# Patient Record
Sex: Female | Born: 1965 | Hispanic: Yes | Marital: Married | Smoking: Never smoker
Health system: Southern US, Community
[De-identification: ages and names within clinical notes are randomized; demographics above are authoritative.]

## PROBLEM LIST (undated history)

## (undated) DIAGNOSIS — E119 Type 2 diabetes mellitus without complications: Secondary | ICD-10-CM

## (undated) DIAGNOSIS — E785 Hyperlipidemia, unspecified: Secondary | ICD-10-CM

## (undated) DIAGNOSIS — I10 Essential (primary) hypertension: Secondary | ICD-10-CM

## (undated) HISTORY — PX: APPENDECTOMY: SHX54

---

## 2006-06-04 ENCOUNTER — Emergency Department: Payer: Self-pay | Admitting: Emergency Medicine

## 2010-02-23 ENCOUNTER — Ambulatory Visit: Payer: Self-pay | Admitting: Family Medicine

## 2010-04-02 ENCOUNTER — Ambulatory Visit: Payer: Self-pay | Admitting: Family Medicine

## 2010-05-18 ENCOUNTER — Ambulatory Visit: Payer: Self-pay | Admitting: Family Medicine

## 2010-10-06 ENCOUNTER — Ambulatory Visit: Payer: Self-pay | Admitting: Surgery

## 2011-01-27 ENCOUNTER — Ambulatory Visit: Payer: Self-pay | Admitting: Family Medicine

## 2011-04-14 ENCOUNTER — Ambulatory Visit: Payer: Self-pay | Admitting: Physician Assistant

## 2011-05-08 ENCOUNTER — Ambulatory Visit: Payer: Self-pay | Admitting: Physician Assistant

## 2011-05-10 ENCOUNTER — Ambulatory Visit: Payer: Self-pay | Admitting: Surgery

## 2015-09-16 ENCOUNTER — Other Ambulatory Visit: Payer: Self-pay | Admitting: Physician Assistant

## 2015-09-16 DIAGNOSIS — R1011 Right upper quadrant pain: Secondary | ICD-10-CM

## 2015-09-22 ENCOUNTER — Ambulatory Visit
Admission: RE | Admit: 2015-09-22 | Discharge: 2015-09-22 | Disposition: A | Discharge status: Home / Self Care | Payer: BLUE CROSS/BLUE SHIELD | Source: Ambulatory Visit | Attending: Physician Assistant | Admitting: Physician Assistant

## 2015-09-22 DIAGNOSIS — R1011 Right upper quadrant pain: Secondary | ICD-10-CM

## 2015-09-22 DIAGNOSIS — R109 Unspecified abdominal pain: Secondary | ICD-10-CM | POA: Diagnosis present

## 2015-10-15 ENCOUNTER — Ambulatory Visit
Admission: RE | Admit: 2015-10-15 | Discharge: 2015-10-15 | Disposition: A | Discharge status: Home / Self Care | Payer: BLUE CROSS/BLUE SHIELD | Source: Ambulatory Visit | Attending: Physician Assistant | Admitting: Physician Assistant

## 2015-10-15 ENCOUNTER — Other Ambulatory Visit: Payer: Self-pay | Admitting: Physician Assistant

## 2015-10-15 DIAGNOSIS — R52 Pain, unspecified: Secondary | ICD-10-CM

## 2015-10-15 DIAGNOSIS — M79604 Pain in right leg: Secondary | ICD-10-CM | POA: Diagnosis present

## 2015-10-15 DIAGNOSIS — M25551 Pain in right hip: Secondary | ICD-10-CM | POA: Diagnosis not present

## 2018-04-20 ENCOUNTER — Encounter: Payer: Self-pay | Admitting: Emergency Medicine

## 2018-04-20 ENCOUNTER — Emergency Department: Payer: 59

## 2018-04-20 ENCOUNTER — Inpatient Hospital Stay: Payer: 59

## 2018-04-20 ENCOUNTER — Inpatient Hospital Stay
Admission: EM | Admit: 2018-04-20 | Discharge: 2018-04-23 | DRG: 871 | Disposition: A | Discharge status: Home / Self Care | Payer: 59 | Attending: Specialist | Admitting: Specialist

## 2018-04-20 ENCOUNTER — Other Ambulatory Visit: Payer: Self-pay

## 2018-04-20 DIAGNOSIS — R6521 Severe sepsis with septic shock: Secondary | ICD-10-CM | POA: Diagnosis present

## 2018-04-20 DIAGNOSIS — N390 Urinary tract infection, site not specified: Secondary | ICD-10-CM | POA: Diagnosis present

## 2018-04-20 DIAGNOSIS — R509 Fever, unspecified: Secondary | ICD-10-CM

## 2018-04-20 DIAGNOSIS — E876 Hypokalemia: Secondary | ICD-10-CM | POA: Diagnosis present

## 2018-04-20 DIAGNOSIS — Z791 Long term (current) use of non-steroidal anti-inflammatories (NSAID): Secondary | ICD-10-CM | POA: Diagnosis not present

## 2018-04-20 DIAGNOSIS — N281 Cyst of kidney, acquired: Secondary | ICD-10-CM | POA: Diagnosis present

## 2018-04-20 DIAGNOSIS — E785 Hyperlipidemia, unspecified: Secondary | ICD-10-CM | POA: Diagnosis present

## 2018-04-20 DIAGNOSIS — I10 Essential (primary) hypertension: Secondary | ICD-10-CM | POA: Diagnosis present

## 2018-04-20 DIAGNOSIS — A4151 Sepsis due to Escherichia coli [E. coli]: Principal | ICD-10-CM | POA: Diagnosis present

## 2018-04-20 DIAGNOSIS — Z8249 Family history of ischemic heart disease and other diseases of the circulatory system: Secondary | ICD-10-CM | POA: Diagnosis not present

## 2018-04-20 DIAGNOSIS — Z87440 Personal history of urinary (tract) infections: Secondary | ICD-10-CM | POA: Diagnosis not present

## 2018-04-20 DIAGNOSIS — R1011 Right upper quadrant pain: Secondary | ICD-10-CM

## 2018-04-20 DIAGNOSIS — E1140 Type 2 diabetes mellitus with diabetic neuropathy, unspecified: Secondary | ICD-10-CM | POA: Diagnosis present

## 2018-04-20 DIAGNOSIS — N10 Acute tubulo-interstitial nephritis: Secondary | ICD-10-CM | POA: Diagnosis not present

## 2018-04-20 DIAGNOSIS — N12 Tubulo-interstitial nephritis, not specified as acute or chronic: Secondary | ICD-10-CM

## 2018-04-20 DIAGNOSIS — Z23 Encounter for immunization: Secondary | ICD-10-CM

## 2018-04-20 DIAGNOSIS — Q6102 Congenital multiple renal cysts: Secondary | ICD-10-CM

## 2018-04-20 DIAGNOSIS — Z794 Long term (current) use of insulin: Secondary | ICD-10-CM

## 2018-04-20 DIAGNOSIS — E1169 Type 2 diabetes mellitus with other specified complication: Secondary | ICD-10-CM

## 2018-04-20 HISTORY — DX: Type 2 diabetes mellitus without complications: E11.9

## 2018-04-20 HISTORY — DX: Hyperlipidemia, unspecified: E78.5

## 2018-04-20 HISTORY — DX: Essential (primary) hypertension: I10

## 2018-04-20 LAB — BLOOD CULTURE ID PANEL (REFLEXED)
ACINETOBACTER BAUMANNII: NOT DETECTED
CANDIDA ALBICANS: NOT DETECTED
CANDIDA GLABRATA: NOT DETECTED
CANDIDA KRUSEI: NOT DETECTED
CANDIDA PARAPSILOSIS: NOT DETECTED
CANDIDA TROPICALIS: NOT DETECTED
Carbapenem resistance: NOT DETECTED
ENTEROBACTER CLOACAE COMPLEX: NOT DETECTED
ESCHERICHIA COLI: DETECTED — AB
Enterobacteriaceae species: DETECTED — AB
Enterococcus species: NOT DETECTED
Haemophilus influenzae: NOT DETECTED
KLEBSIELLA PNEUMONIAE: NOT DETECTED
Klebsiella oxytoca: NOT DETECTED
Listeria monocytogenes: NOT DETECTED
Neisseria meningitidis: NOT DETECTED
PROTEUS SPECIES: NOT DETECTED
Pseudomonas aeruginosa: NOT DETECTED
Serratia marcescens: NOT DETECTED
Staphylococcus aureus (BCID): NOT DETECTED
Staphylococcus species: NOT DETECTED
Streptococcus agalactiae: NOT DETECTED
Streptococcus pneumoniae: NOT DETECTED
Streptococcus pyogenes: NOT DETECTED
Streptococcus species: NOT DETECTED

## 2018-04-20 LAB — CBC
HCT: 38.4 % (ref 35.0–47.0)
Hemoglobin: 13.6 g/dL (ref 12.0–16.0)
MCH: 29.9 pg (ref 26.0–34.0)
MCHC: 35.5 g/dL (ref 32.0–36.0)
MCV: 84.2 fL (ref 80.0–100.0)
PLATELETS: 183 10*3/uL (ref 150–440)
RBC: 4.56 MIL/uL (ref 3.80–5.20)
RDW: 13.8 % (ref 11.5–14.5)
WBC: 7.4 10*3/uL (ref 3.6–11.0)

## 2018-04-20 LAB — COMPREHENSIVE METABOLIC PANEL
ALBUMIN: 3.6 g/dL (ref 3.5–5.0)
ALK PHOS: 83 U/L (ref 38–126)
ALT: 17 U/L (ref 0–44)
AST: 22 U/L (ref 15–41)
Anion gap: 14 (ref 5–15)
BUN: 21 mg/dL — ABNORMAL HIGH (ref 6–20)
CALCIUM: 8.8 mg/dL — AB (ref 8.9–10.3)
CO2: 23 mmol/L (ref 22–32)
CREATININE: 0.99 mg/dL (ref 0.44–1.00)
Chloride: 92 mmol/L — ABNORMAL LOW (ref 98–111)
GFR calc non Af Amer: >60 mL/min (ref >60)
GLUCOSE: 533 mg/dL — AB (ref 70–99)
Potassium: 3.7 mmol/L (ref 3.5–5.1)
SODIUM: 129 mmol/L — AB (ref 135–145)
Total Bilirubin: 1.8 mg/dL — ABNORMAL HIGH (ref 0.3–1.2)
Total Protein: 7.8 g/dL (ref 6.5–8.1)

## 2018-04-20 LAB — URINALYSIS, COMPLETE (UACMP) WITH MICROSCOPIC
Bilirubin Urine: NEGATIVE
Glucose, UA: >=500 mg/dL — AB
Ketones, ur: 5 mg/dL — AB
Leukocytes, UA: NEGATIVE
Nitrite: NEGATIVE
Protein, ur: 30 mg/dL — AB
SPECIFIC GRAVITY, URINE: 1.02 (ref 1.005–1.030)
pH: 6 (ref 5.0–8.0)

## 2018-04-20 LAB — TROPONIN I

## 2018-04-20 LAB — LACTIC ACID, PLASMA: Lactic Acid, Venous: 1.7 mmol/L (ref 0.5–1.9)

## 2018-04-20 LAB — GLUCOSE, CAPILLARY
GLUCOSE-CAPILLARY: 434 mg/dL — AB (ref 70–99)
GLUCOSE-CAPILLARY: 354 mg/dL — AB (ref 70–99)
Glucose-Capillary: 429 mg/dL — ABNORMAL HIGH (ref 70–99)
Glucose-Capillary: 320 mg/dL — ABNORMAL HIGH (ref 70–99)
Glucose-Capillary: 396 mg/dL — ABNORMAL HIGH (ref 70–99)

## 2018-04-20 LAB — LIPASE, BLOOD: Lipase: 50 U/L (ref 11–51)

## 2018-04-20 MED ORDER — SODIUM CHLORIDE 0.9 % IV SOLN
2.0000 g | INTRAVENOUS | Status: DC
  Administered 2018-04-21 – 2018-04-22 (×2): 2 g via INTRAVENOUS
  Filled 2018-04-20: qty 2
  Filled 2018-04-20: qty 20
  Filled 2018-04-20: qty 2

## 2018-04-20 MED ORDER — ONDANSETRON HCL 4 MG/2ML IJ SOLN: 4.0000 mg | Freq: Four times a day (QID) | INTRAMUSCULAR | Status: DC | PRN

## 2018-04-20 MED ORDER — SODIUM CHLORIDE 0.9 % IV SOLN
INTRAVENOUS | Status: DC
  Administered 2018-04-20 – 2018-04-22 (×6): via INTRAVENOUS

## 2018-04-20 MED ORDER — SODIUM CHLORIDE 0.9 % IV BOLUS
1000.0000 mL | Freq: Once | INTRAVENOUS | Status: AC
  Administered 2018-04-20: 1000 mL via INTRAVENOUS

## 2018-04-20 MED ORDER — PIPERACILLIN-TAZOBACTAM 3.375 G IVPB 30 MIN
3.3750 g | Freq: Once | INTRAVENOUS | Status: AC
  Administered 2018-04-20: 3.375 g via INTRAVENOUS
  Filled 2018-04-20: qty 50

## 2018-04-20 MED ORDER — GADOBUTROL 1 MMOL/ML IV SOLN
8.3000 mL | Freq: Once | INTRAVENOUS | Status: AC | PRN
  Administered 2018-04-20: 23:00:00 8.3 mL via INTRAVENOUS

## 2018-04-20 MED ORDER — FENTANYL CITRATE (PF) 100 MCG/2ML IJ SOLN
50.0000 ug | Freq: Once | INTRAMUSCULAR | Status: AC
  Administered 2018-04-20: 50 ug via INTRAVENOUS
  Filled 2018-04-20: qty 2

## 2018-04-20 MED ORDER — ACETAMINOPHEN 650 MG RE SUPP: 650.0000 mg | Freq: Four times a day (QID) | RECTAL | Status: DC | PRN

## 2018-04-20 MED ORDER — PNEUMOCOCCAL VAC POLYVALENT 25 MCG/0.5ML IJ INJ
0.5000 mL | INJECTION | INTRAMUSCULAR | Status: AC
  Administered 2018-04-21: 0.5 mL via INTRAMUSCULAR
  Filled 2018-04-20: qty 0.5

## 2018-04-20 MED ORDER — IOPAMIDOL (ISOVUE-300) INJECTION 61%
100.0000 mL | Freq: Once | INTRAVENOUS | Status: AC | PRN
  Administered 2018-04-20: 100 mL via INTRAVENOUS

## 2018-04-20 MED ORDER — ENOXAPARIN SODIUM 40 MG/0.4ML ~~LOC~~ SOLN
40.0000 mg | SUBCUTANEOUS | Status: DC
  Administered 2018-04-20 – 2018-04-22 (×3): 40 mg via SUBCUTANEOUS
  Filled 2018-04-20 (×3): qty 0.4

## 2018-04-20 MED ORDER — IOPAMIDOL (ISOVUE-300) INJECTION 61%
30.0000 mL | Freq: Once | INTRAVENOUS | Status: AC | PRN
  Administered 2018-04-20: 30 mL via ORAL

## 2018-04-20 MED ORDER — SIMVASTATIN 20 MG PO TABS
40.0000 mg | ORAL_TABLET | Freq: Every day | ORAL | Status: DC
  Administered 2018-04-20 – 2018-04-22 (×3): 40 mg via ORAL
  Filled 2018-04-20 (×3): qty 2

## 2018-04-20 MED ORDER — INFLUENZA VAC SPLIT QUAD 0.5 ML IM SUSY
0.5000 mL | PREFILLED_SYRINGE | INTRAMUSCULAR | Status: AC
  Administered 2018-04-21: 08:00:00 0.5 mL via INTRAMUSCULAR
  Filled 2018-04-20: qty 0.5

## 2018-04-20 MED ORDER — POLYETHYLENE GLYCOL 3350 17 G PO PACK: 17.0000 g | PACK | Freq: Every day | ORAL | Status: DC | PRN

## 2018-04-20 MED ORDER — SODIUM CHLORIDE 0.9 % IV SOLN
1.0000 g | INTRAVENOUS | Status: DC
  Administered 2018-04-20: 1 g via INTRAVENOUS
  Filled 2018-04-20 (×2): qty 10

## 2018-04-20 MED ORDER — VANCOMYCIN HCL IN DEXTROSE 1-5 GM/200ML-% IV SOLN
1000.0000 mg | Freq: Once | INTRAVENOUS | Status: AC
  Administered 2018-04-20: 1000 mg via INTRAVENOUS
  Filled 2018-04-20: qty 200

## 2018-04-20 MED ORDER — ALBUTEROL SULFATE (2.5 MG/3ML) 0.083% IN NEBU: 2.5000 mg | INHALATION_SOLUTION | RESPIRATORY_TRACT | Status: DC | PRN

## 2018-04-20 MED ORDER — INSULIN ASPART 100 UNIT/ML ~~LOC~~ SOLN
0.0000 [IU] | Freq: Every day | SUBCUTANEOUS | Status: DC
  Administered 2018-04-20: 5 [IU] via SUBCUTANEOUS
  Administered 2018-04-21: 0.3 [IU] via SUBCUTANEOUS
  Administered 2018-04-22: 3 [IU] via SUBCUTANEOUS
  Filled 2018-04-20 (×3): qty 1

## 2018-04-20 MED ORDER — HYDROCODONE-ACETAMINOPHEN 5-325 MG PO TABS
1.0000 | ORAL_TABLET | ORAL | Status: DC | PRN
  Administered 2018-04-20: 19:00:00 1 via ORAL
  Filled 2018-04-20: qty 1

## 2018-04-20 MED ORDER — SODIUM CHLORIDE 0.9 % IV SOLN: 1.0000 g | INTRAVENOUS | Status: DC

## 2018-04-20 MED ORDER — ACETAMINOPHEN 500 MG PO TABS
1000.0000 mg | ORAL_TABLET | Freq: Once | ORAL | Status: AC
  Administered 2018-04-20: 1000 mg via ORAL
  Filled 2018-04-20: qty 2

## 2018-04-20 MED ORDER — PANTOPRAZOLE SODIUM 40 MG PO TBEC
40.0000 mg | DELAYED_RELEASE_TABLET | Freq: Every day | ORAL | Status: DC
  Administered 2018-04-21 – 2018-04-23 (×3): 40 mg via ORAL
  Filled 2018-04-20 (×3): qty 1

## 2018-04-20 MED ORDER — GABAPENTIN 300 MG PO CAPS
300.0000 mg | ORAL_CAPSULE | Freq: Two times a day (BID) | ORAL | Status: DC
  Administered 2018-04-20 – 2018-04-23 (×6): 300 mg via ORAL
  Filled 2018-04-20 (×6): qty 1

## 2018-04-20 MED ORDER — KETOROLAC TROMETHAMINE 30 MG/ML IJ SOLN: 30.0000 mg | Freq: Four times a day (QID) | INTRAMUSCULAR | Status: DC | PRN

## 2018-04-20 MED ORDER — INSULIN DETEMIR 100 UNIT/ML ~~LOC~~ SOLN
45.0000 [IU] | Freq: Every day | SUBCUTANEOUS | Status: DC
  Administered 2018-04-20: 45 [IU] via SUBCUTANEOUS
  Filled 2018-04-20 (×2): qty 0.45

## 2018-04-20 MED ORDER — ACETAMINOPHEN 325 MG PO TABS
650.0000 mg | ORAL_TABLET | Freq: Four times a day (QID) | ORAL | Status: DC | PRN
  Administered 2018-04-20 – 2018-04-23 (×6): 650 mg via ORAL
  Filled 2018-04-20 (×6): qty 2

## 2018-04-20 MED ORDER — INSULIN ASPART 100 UNIT/ML ~~LOC~~ SOLN
20.0000 [IU] | Freq: Three times a day (TID) | SUBCUTANEOUS | Status: DC
  Administered 2018-04-21 – 2018-04-23 (×6): 20 [IU] via SUBCUTANEOUS
  Filled 2018-04-20 (×5): qty 1

## 2018-04-20 MED ORDER — INSULIN ASPART 100 UNIT/ML ~~LOC~~ SOLN
10.0000 [IU] | Freq: Once | SUBCUTANEOUS | Status: AC
  Administered 2018-04-20: 10 [IU] via SUBCUTANEOUS
  Filled 2018-04-20: qty 1

## 2018-04-20 MED ORDER — ONDANSETRON HCL 4 MG PO TABS: 4.0000 mg | ORAL_TABLET | Freq: Four times a day (QID) | ORAL | Status: DC | PRN

## 2018-04-20 MED ORDER — ONDANSETRON HCL 4 MG/2ML IJ SOLN
4.0000 mg | Freq: Once | INTRAMUSCULAR | Status: AC
  Administered 2018-04-20: 4 mg via INTRAVENOUS
  Filled 2018-04-20: qty 2

## 2018-04-20 MED ORDER — SODIUM CHLORIDE 0.9 % IV SOLN
1.0000 g | Freq: Once | INTRAVENOUS | Status: AC
  Administered 2018-04-20: 1 g via INTRAVENOUS
  Filled 2018-04-20: qty 1

## 2018-04-20 MED ORDER — INSULIN ASPART 100 UNIT/ML ~~LOC~~ SOLN
0.0000 [IU] | Freq: Three times a day (TID) | SUBCUTANEOUS | Status: DC
  Administered 2018-04-20: 17:00:00 15 [IU] via SUBCUTANEOUS
  Administered 2018-04-20 – 2018-04-21 (×2): 11 [IU] via SUBCUTANEOUS
  Administered 2018-04-21: 08:00:00 8 [IU] via SUBCUTANEOUS
  Administered 2018-04-21 – 2018-04-22 (×3): 5 [IU] via SUBCUTANEOUS
  Administered 2018-04-22: 17:00:00 8 [IU] via SUBCUTANEOUS
  Filled 2018-04-20 (×9): qty 1

## 2018-04-20 MED ORDER — ATENOLOL 50 MG PO TABS
50.0000 mg | ORAL_TABLET | Freq: Every day | ORAL | Status: DC
  Administered 2018-04-20 – 2018-04-23 (×4): 50 mg via ORAL
  Filled 2018-04-20 (×5): qty 1

## 2018-04-20 MED ORDER — POLYETHYLENE GLYCOL 3350 17 G PO PACK
17.0000 g | PACK | Freq: Two times a day (BID) | ORAL | Status: DC
  Administered 2018-04-21: 17 g via ORAL
  Filled 2018-04-20: qty 1

## 2018-04-20 NOTE — ED Notes (Signed)
Pt went to ultrasound.

## 2018-04-20 NOTE — ED Triage Notes (Signed)
Patient coming in tonight for abdominal pain and nausea. Saw pcp on Tuesday and told her she should start feeling better in a couple of days. Patient states pain is 10/10

## 2018-04-20 NOTE — Progress Notes (Signed)
CODE SEPSIS - PHARMACY COMMUNICATION  **Broad Spectrum Antibiotics should be administered within 1 hour of Sepsis diagnosis**  Time Code Sepsis Called/Page Received: 1003 0542  Antibiotics Ordered: 1003 0500  Time of 1st antibiotic administration: 1003 0610  Additional action taken by pharmacy:   If necessary, Name of Provider/Nurse Contacted:     Erich Montane ,PharmD Clinical Pharmacist  04/20/2018  6:13 AM

## 2018-04-20 NOTE — Progress Notes (Signed)
PHARMACY - PHYSICIAN COMMUNICATION CRITICAL VALUE ALERT - BLOOD CULTURE IDENTIFICATION (BCID)  Results for orders placed or performed during the hospital encounter of 04/20/18  Blood Culture ID Panel (Reflexed) (Collected: 04/20/2018  5:14 AM)  Result Value Ref Range   Enterococcus species NOT DETECTED NOT DETECTED   Listeria monocytogenes NOT DETECTED NOT DETECTED   Staphylococcus species NOT DETECTED NOT DETECTED   Staphylococcus aureus (BCID) NOT DETECTED NOT DETECTED   Streptococcus species NOT DETECTED NOT DETECTED   Streptococcus agalactiae NOT DETECTED NOT DETECTED   Streptococcus pneumoniae NOT DETECTED NOT DETECTED   Streptococcus pyogenes NOT DETECTED NOT DETECTED   Acinetobacter baumannii NOT DETECTED NOT DETECTED   Enterobacteriaceae species DETECTED (A) NOT DETECTED   Enterobacter cloacae complex NOT DETECTED NOT DETECTED   Escherichia coli DETECTED (A) NOT DETECTED   Klebsiella oxytoca NOT DETECTED NOT DETECTED   Klebsiella pneumoniae NOT DETECTED NOT DETECTED   Proteus species NOT DETECTED NOT DETECTED   Serratia marcescens NOT DETECTED NOT DETECTED   Carbapenem resistance NOT DETECTED NOT DETECTED   Haemophilus influenzae NOT DETECTED NOT DETECTED   Neisseria meningitidis NOT DETECTED NOT DETECTED   Pseudomonas aeruginosa NOT DETECTED NOT DETECTED   Candida albicans NOT DETECTED NOT DETECTED   Candida glabrata NOT DETECTED NOT DETECTED   Candida krusei NOT DETECTED NOT DETECTED   Candida parapsilosis NOT DETECTED NOT DETECTED   Candida tropicalis NOT DETECTED NOT DETECTED    Name of physician (or Provider) Contacted: Sudini   Changes to prescribed antibiotics required:  Yes, will increase dose to Ceftriaxone 2 gm IV Q24H.   Alisandra Son D 04/20/2018  5:00 PM

## 2018-04-20 NOTE — ED Notes (Signed)
Pt assisted to the bathroom at this time. Pt back to bed without incident. Will continue to monitor.

## 2018-04-20 NOTE — ED Notes (Signed)
CT notified patient done with contrast 

## 2018-04-20 NOTE — ED Provider Notes (Signed)
[facility] Emergency Department Provider Note   ____________________________________________   First MD Initiated Contact with Patient 04/20/18 [phone]     (approximate)  I have reviewed the triage vital signs and the nursing notes.   HISTORY  Chief Complaint Abdominal Pain  History obtained using [employer] Spanish interpreter  HPI Nicole Villegas is a 52 y.o. female who presents to the ED from home with a chief complaint of abdominal pain and nausea.  Symptoms started 5 days ago.  Saw her PCP 2 days ago who told her she should be feeling better in a couple of days.  Complains of nausea without vomiting.  Felt feverish at home and took Tylenol prior to arrival.  Denies chest pain, cough, shortness of breath, vomiting, dysuria, diarrhea.  Denies recent travel or trauma.   Past Medical History:  Diagnosis Date  . Diabetes mellitus without complication (HCC)   . Hyperlipidemia   . Hypertension     There are no active problems to display for this patient.    Prior to Admission medications   Not on File    Allergies Patient has no known allergies.  No family history on file.  Social History Social History   Tobacco Use  . Smoking status: Not on file  Substance Use Topics  . Alcohol use: Not on file  . Drug use: Not on file  No recent EtOH  Review of Systems  Constitutional: Positive for fever/chills Eyes: No visual changes. ENT: No sore throat. Cardiovascular: Denies chest pain. Respiratory: Denies shortness of breath. Gastrointestinal: Positive for abdominal pain.  Positive for nausea, no vomiting.  No diarrhea.  No constipation. Genitourinary: Negative for dysuria. Musculoskeletal: Negative for back pain. Skin: Negative for rash. Neurological: Negative for headaches, focal weakness or numbness.   ____________________________________________   PHYSICAL EXAM:  VITAL SIGNS: ED Triage Vitals [04/20/18 0306]  Enc Vitals Group     BP  (!) 189/87     Pulse Rate (!) 122     Resp 17     Temp 100.3 F (37.9 C)     Temp Source Oral     SpO2 99 %     Weight      Height      Head Circumference      Peak Flow      Pain Score 10     Pain Loc      Pain Edu?      Excl. in GC?     Constitutional: Alert and oriented.  Uncomfortable appearing and in mild acute distress. Eyes: Conjunctivae are normal. PERRL. EOMI. Head: Atraumatic. Nose: No congestion/rhinnorhea. Mouth/Throat: Mucous membranes are moist.  Oropharynx non-erythematous. Neck: No stridor.   Cardiovascular: Tachycardic rate, regular rhythm. Grossly normal heart sounds.  Good peripheral circulation. Respiratory: Normal respiratory effort.  No retractions. Lungs CTAB. Gastrointestinal: Soft and mildly tender to palpation right upper quadrant without rebound or guarding. No distention. No abdominal bruits. No CVA tenderness. Musculoskeletal: No lower extremity tenderness nor edema.  No joint effusions. Neurologic:  Normal speech and language. No gross focal neurologic deficits are appreciated. No gait instability. Skin:  Skin is warm, dry and intact. No rash noted.  No petechiae. Psychiatric: Mood and affect are normal. Speech and behavior are normal.  ____________________________________________   LABS (all labs ordered are listed, but only abnormal results are displayed)  Labs Reviewed  COMPREHENSIVE METABOLIC PANEL - Abnormal; Notable for the following components:      Result Value   Sodium 129 (*)  Chloride 92 (*)    Glucose, Bld 533 (*)    BUN 21 (*)    Calcium 8.8 (*)    Total Bilirubin 1.8 (*)    All other components within normal limits  URINALYSIS, COMPLETE (UACMP) WITH MICROSCOPIC - Abnormal; Notable for the following components:   Color, Urine YELLOW (*)    APPearance HAZY (*)    Glucose, UA >=500 (*)    Hgb urine dipstick MODERATE (*)    Ketones, ur 5 (*)    Protein, ur 30 (*)    Bacteria, UA FEW (*)    All other components within  normal limits  CULTURE, BLOOD (ROUTINE X 2)  CULTURE, BLOOD (ROUTINE X 2)  URINE CULTURE  LIPASE, BLOOD  CBC  LACTIC ACID, PLASMA  TROPONIN I  POC URINE PREG, ED   ____________________________________________  EKG  ED ECG REPORT I, SUNG,JADE J, the attending physician, personally viewed and interpreted this ECG.   Date: 04/20/2018  EKG Time: 0720  Rate: 98  Rhythm: normal EKG, normal sinus rhythm  Axis: Normal  Intervals:none  ST&T Change: Nonspecific  ____________________________________________  RADIOLOGY  ED MD interpretation: Unremarkable abdominal ultrasound  Official radiology report(s): US Abdomen Limited Ruq  Result Date: 04/20/2018 CLINICAL DATA:  Right upper quadrant pain. EXAM: ULTRASOUND ABDOMEN LIMITED RIGHT UPPER QUADRANT COMPARISON:  Right upper quadrant ultrasound 09/22/2015 FINDINGS: Gallbladder: Physiologically distended, fold in the fundus. No gallstones or wall thickening visualized. No pericholecystic fluid. No sonographic Murphy sign noted by sonographer. Common bile duct: Diameter: 3 mm, normal. Liver: No focal lesion identified. Diffusely increased in parenchymal echogenicity. Liver parenchyma is difficult to penetrate. Portal vein is patent on color Doppler imaging with normal direction of blood flow towards the liver. IMPRESSION: 1. Normal sonographic appearance of the gallbladder and biliary tree. 2. Hepatic steatosis. Electronically Signed   By: Narda Rutherford M.D.   On: 04/20/2018 06:51    ____________________________________________   PROCEDURES  Procedure(s) performed: None  Procedures  Critical Care performed: Yes, see critical care note(s)   CRITICAL CARE Performed by: Irean Hong   Total critical care time: 30 minutes  Critical care time was exclusive of separately billable procedures and treating other patients.  Critical care was necessary to treat or prevent imminent or life-threatening deterioration.  Critical care was  time spent personally by me on the following activities: development of treatment plan with patient and/or surrogate as well as nursing, discussions with consultants, evaluation of patient's response to treatment, examination of patient, obtaining history from patient or surrogate, ordering and performing treatments and interventions, ordering and review of laboratory studies, ordering and review of radiographic studies, pulse oximetry and re-evaluation of patient's condition.  ____________________________________________   INITIAL IMPRESSION / ASSESSMENT AND PLAN / ED COURSE  As part of my medical decision making, I reviewed the following data within the electronic MEDICAL RECORD NUMBER Nursing notes reviewed and incorporated, Interpreter needed, Labs reviewed, EKG interpreted, Old chart reviewed, Radiograph reviewed  and Notes from prior ED visits   52 year old female who presents with fever, abdominal pain and nausea. Differential diagnosis includes, but is not limited to, biliary disease (biliary colic, acute cholecystitis, cholangitis, choledocholithiasis, etc), intrathoracic causes for epigastric abdominal pain including ACS, gastritis, duodenitis, pancreatitis, small bowel or large bowel obstruction, abdominal aortic aneurysm, hernia, and ulcer(s).  Recheck patient's oral temperature which is now 102.9 F.  ED code sepsis initiated.  Will administer oral Tylenol, initiate IV fluid resuscitation.  Laboratory results remarkable for mild hyponatremia, hyperglycemia without elevation  of anion gap and elevated T bili.  Broad-spectrum IV antibiotics ordered.  Will start with right upper quadrant abdominal ultrasound to evaluate for cholecystitis.   Clinical Course as of Apr 20 716  Thu Apr 20, 2018  1610 US unremarkable. Will proceed with CT abdomen/pelvis. Care transferred to Dr. Cyril Loosen.   [JS]    Clinical Course User Index [JS] Irean Hong, MD      ____________________________________________   FINAL CLINICAL IMPRESSION(S) / ED DIAGNOSES  Final diagnoses:  Fever, unspecified fever cause  Right upper quadrant abdominal pain     ED Discharge Orders    None       Note:  This document was prepared using Dragon voice recognition software and may include unintentional dictation errors.    Irean Hong, MD 04/20/18 [phone]

## 2018-04-20 NOTE — ED Notes (Signed)
Pt returned from CT at this time.  

## 2018-04-20 NOTE — H&P (Signed)
[facility] [place] [facility]   PATIENT NAME: Nicole Villegas    MR#:  [id number]  DATE OF BIRTH:  1966-06-07  DATE OF ADMISSION:  04/20/2018  PRIMARY CARE PHYSICIAN: Verlee Monte, PA-C   REQUESTING/REFERRING PHYSICIAN: Dr. Cyril Loosen  CHIEF COMPLAINT:   Chief Complaint  Patient presents with  . Abdominal Pain    HISTORY OF PRESENT ILLNESS:  Nicole Villegas is a 52 y.o. female with a known history of diabetes, HTN presented to the emergency room with right abdominal pain.  Initial suspicion was for gallbladder abnormality and an ultrasound of right upper quadrant was checked and showed hepatic steatosis but no cholecystitis.  Patient had fever at home and had taken Tylenol prior to presentation.  Here in the emergency room she had fever of 102.9.  Code sepsis was initiated.  Abnormal urinalysis with UTI.  CT scan of the abdomen and pelvis has been checked which showed left and right kidney cysts seem to be solid.  Concern for renal cell carcinoma.  Patient is being admitted with sepsis and possible pyelonephritis and needing further work-up for renal cysts.  PAST MEDICAL HISTORY:   Past Medical History:  Diagnosis Date  . Diabetes mellitus without complication (HCC)   . Hyperlipidemia   . Hypertension     PAST SURGICAL HISTORY:  None  SOCIAL HISTORY:   Social History   Tobacco Use  . Smoking status: Not on file  Substance Use Topics  . Alcohol use: Not on file    FAMILY HISTORY:  No family history on file.  HTN  DRUG ALLERGIES:  No Known Allergies  REVIEW OF SYSTEMS:   Review of Systems  Constitutional: Positive for chills, fever and malaise/fatigue. Negative for weight loss.  HENT: Negative for hearing loss and nosebleeds.   Eyes: Negative for blurred vision, double vision and pain.  Respiratory: Negative for cough, hemoptysis, sputum production, shortness of breath and wheezing.   Cardiovascular: Negative for chest pain, palpitations,  orthopnea and leg swelling.  Gastrointestinal: Positive for abdominal pain. Negative for constipation, diarrhea, nausea and vomiting.  Genitourinary: Negative for dysuria and hematuria.  Musculoskeletal: Negative for back pain, falls and myalgias.  Skin: Negative for rash.  Neurological: Negative for dizziness, tremors, sensory change, speech change, focal weakness, seizures and headaches.  Endo/Heme/Allergies: Does not bruise/bleed easily.  Psychiatric/Behavioral: Negative for depression and memory loss. The patient is not nervous/anxious.     MEDICATIONS AT HOME:   Prior to Admission medications   Medication Sig Start Date End Date Taking? Authorizing Provider  atenolol (TENORMIN) 50 MG tablet Take 50 mg by mouth daily.   Yes [provider]  cyclobenzaprine (FLEXERIL) 5 MG tablet Take 5 mg by mouth daily as needed for muscle spasms.    Yes [provider]  fexofenadine (ALLEGRA) 60 MG tablet Take 60 mg by mouth 2 (two) times daily as needed for allergies.    Yes [provider]  gabapentin (NEURONTIN) 300 MG capsule Take 300 mg by mouth 2 (two) times daily.   Yes [provider]  GLIPIZIDE XL 10 MG 24 hr tablet Take 10 mg by mouth 2 (two) times daily.   Yes [provider]  insulin aspart (NOVOLOG) 100 UNIT/ML injection Inject 30 Units into the skin 3 (three) times daily before meals.   Yes [provider]  insulin detemir (LEVEMIR) 100 UNIT/ML injection Inject 60 Units into the skin at bedtime.   Yes [provider]  lisinopril-hydrochlorothiazide (PRINZIDE,ZESTORETIC) 20-12.5 MG tablet  Take 2 tablets by mouth daily.   Yes [provider]  meloxicam (MOBIC) 15 MG tablet Take 15 mg by mouth daily.   Yes [provider]  metFORMIN (GLUCOPHAGE) 500 MG tablet Take 1,000 mg by mouth 2 (two) times daily.   Yes [provider]  omeprazole (PRILOSEC) 20 MG capsule Take 20 mg by mouth daily.   Yes [provider]  simvastatin (ZOCOR) 40 MG tablet Take 40 mg by mouth at bedtime.   Yes [provider]     VITAL SIGNS:  Blood pressure (!) 114/52, pulse (!) 121, temperature 100.3 F (37.9 C), temperature source Oral, resp. rate (!) 28, height 5\' 1"  (1.549 m), weight 83.2 kg, SpO2 99 %.  PHYSICAL EXAMINATION:  Physical Exam  GENERAL:  52 y.o.-year-old patient lying in the bed with no acute distress.  EYES: Pupils equal, round, reactive to light and accommodation. No scleral icterus. Extraocular muscles intact.  HEENT: Head atraumatic, normocephalic. Oropharynx and nasopharynx clear. No oropharyngeal erythema, moist oral mucosa  NECK:  Supple, no jugular venous distention. No thyroid enlargement, no tenderness.  LUNGS: Normal breath sounds bilaterally, no wheezing, rales, rhonchi. No use of accessory muscles of respiration.  CARDIOVASCULAR: S1, S2 normal. No murmurs, rubs, or gallops.  ABDOMEN: Soft, right sided tenderness, nondistended. Bowel sounds present. No organomegaly or mass.  EXTREMITIES: No pedal edema, cyanosis, or clubbing. + 2 pedal & radial pulses b/l.   NEUROLOGIC: Cranial nerves II through XII are intact. No focal Motor or sensory deficits appreciated b/l PSYCHIATRIC: The patient is alert and oriented x 3. Good affect.  SKIN: No obvious rash, lesion, or ulcer.   LABORATORY PANEL:   CBC Recent Labs  Lab 04/20/18 0308  WBC 7.4  HGB 13.6  HCT 38.4  PLT 183   ------------------------------------------------------------------------------------------------------------------  Chemistries  Recent Labs  Lab 04/20/18 0308  NA 129*  K 3.7  CL 92*  CO2 23  GLUCOSE 533*  BUN 21*  CREATININE 0.99  CALCIUM 8.8*  AST 22  ALT 17  ALKPHOS 83  BILITOT 1.8*   ------------------------------------------------------------------------------------------------------------------  Cardiac Enzymes Recent Labs  Lab 04/20/18 0515  TROPONINI <0.03    ------------------------------------------------------------------------------------------------------------------  RADIOLOGY:  Ct Abdomen Pelvis W Contrast  Result Date: 04/20/2018 CLINICAL DATA:  Abdominal pain and nausea EXAM: CT ABDOMEN AND PELVIS WITH CONTRAST TECHNIQUE: Multidetector CT imaging of the abdomen and pelvis was performed using the standard protocol following bolus administration of intravenous contrast. Oral contrast was also administered. CONTRAST:  ISOVUE-300 IOPAMIDOL (ISOVUE-300) INJECTION 61% COMPARISON:  None. FINDINGS: Lower chest: Lung bases are clear. Hepatobiliary: There is hepatic steatosis. No focal liver lesions are appreciable. There is no gallbladder wall thickening. There is no appreciable biliary duct dilatation. Pancreas: There is no pancreatic mass or inflammatory focus. Spleen: No splenic lesions are evident. Adrenals/Urinary Tract: Adrenals bilaterally appear normal. There is a subtle area of decreased attenuation in the anterior segment of the midportion of the right kidney measuring 2.5 x 2.1 cm. This abnormal attenuation is somewhat more evident on axial images than on the re-formatted images. There is vague suggestion of a lesion in the mid left kidney on coronal slice 64 series 5. More inferiorly in the right kidney, there is an area of decreased attenuation which is better appreciated on the sagittal and coronal images measuring 2.5 x 1.9 x 1.9 cm. No left-sided renal masses evident. There is no evident hydronephrosis on either side. There is no renal or ureteral calculus on either side.  Urinary bladder is midline with wall thickness within normal limits. Stomach/Bowel: There is no appreciable bowel wall or mesenteric thickening. There is moderate stool in the colon. There is no appreciable bowel obstruction. There is no free air or portal venous air. Vascular/Lymphatic: There is aortic atherosclerosis. No aneurysm evident. Major mesenteric arterial  vessels appear patent. Major venous structures also appear patent. Note that the left renal vein in particular is patent. There is no appreciable adenopathy in the abdomen or pelvis. Reproductive: Uterus is anteverted and mildly canted to the left. No evident pelvic mass. Other: Appendix appears unremarkable. There is no abscess or ascites in the abdomen or pelvis. Musculoskeletal: Degenerative changes noted in the lumbar spine. No blastic or lytic bone lesions. There is no intramuscular or abdominal wall lesion evident. IMPRESSION: 1. Solid-appearing mass arising from the lower pole of the left kidney measuring 2.5 x 1.9 x 1.9 cm. Questionable second mass along the midportion of the right kidney, well seen only on the axial view measuring 2.5 x 2.1 cm. The possibility of renal cell carcinoma within the right kidney must be of concern. Foci of infection are also possible. Given concern for potential neoplasm, would advise pre and post-contrast MR of the kidneys to further evaluate. Correlation with urinalysis with respect to potential areas of infection within the right kidney advised. 2. No hydronephrosis on either side. No evident renal or ureteral calculus on either side. 3. No evident bowel obstruction. No abscess in the abdomen or pelvis. Appendix appears normal. 4.  Aortic atherosclerosis. 5.  Hepatic steatosis. Aortic Atherosclerosis (ICD10-I70.0). These results were called by telephone at the time of interpretation on 04/20/2018 at 10:28 am to Dr. Jene Every , who verbally acknowledged these results. Electronically Signed   By: Bretta Bang III M.D.   On: 04/20/2018 10:29   US Abdomen Limited Ruq  Result Date: 04/20/2018 CLINICAL DATA:  Right upper quadrant pain. EXAM: ULTRASOUND ABDOMEN LIMITED RIGHT UPPER QUADRANT COMPARISON:  Right upper quadrant ultrasound 09/22/2015 FINDINGS: Gallbladder: Physiologically distended, fold in the fundus. No gallstones or wall thickening visualized. No  pericholecystic fluid. No sonographic Murphy sign noted by sonographer. Common bile duct: Diameter: 3 mm, normal. Liver: No focal lesion identified. Diffusely increased in parenchymal echogenicity. Liver parenchyma is difficult to penetrate. Portal vein is patent on color Doppler imaging with normal direction of blood flow towards the liver. IMPRESSION: 1. Normal sonographic appearance of the gallbladder and biliary tree. 2. Hepatic steatosis. Electronically Signed   By: Narda Rutherford M.D.   On: 04/20/2018 06:51     IMPRESSION AND PLAN:   * UTI and likely right pyelonephritis with sepsis POA Start ceftriaxone Send urine and blood cx. IV fluids Normal lactic acid  * Uncontrolled diabetes mellitus.  Patient will be started on her home Levemir and sliding scale insulin.  Likely uncontrolled due to acute infection.  * Renal mass vs cyst Check MRI abd  * Pseudohyponatremia due to hyperglycemia. Should resolve once blood sugars are controlled.  All the records are reviewed and case discussed with ED provider. Management plans discussed with the patient, family and they are in agreement.  CODE STATUS: FULL CODE  TOTAL TIME TAKING CARE OF THIS PATIENT: 40 minutes.   Molinda Bailiff Lanard Arguijo M.D on 04/20/2018 at 11:37 AM  Between 7am to 6pm - Pager - [phone]  After 6pm go to [email or web address] - password [email or web address] [facility]  [facility]  Office  [phone]  CC: Primary care physician; Verlee Monte, PA-C  Note: This  dictation was prepared with Dragon dictation along with smaller phrase technology. Any transcriptional errors that result from this process are unintentional.

## 2018-04-20 NOTE — Progress Notes (Signed)
Inpatient Diabetes Program Recommendations  AACE/ADA: New Consensus Statement on Inpatient Glycemic Control (2019)  Target Ranges:  Prepandial:   less than 140 mg/dL      Peak postprandial:   less than 180 mg/dL (1-2 hours)      Critically ill patients:  140 - 180 mg/dL   Results for QUENISHA, LOVINS (MRN [id number]) as of 04/20/2018 09:33  Ref. Range 04/20/2018 08:32  Glucose-Capillary Latest Ref Range: 70 - 99 mg/dL 409 (H)  Results for GUILA, OWENSBY (MRN [id number]) as of 04/20/2018 09:33  Ref. Range 04/20/2018 03:08  Glucose Latest Ref Range: 70 - 99 mg/dL 956 (HH)   Review of Glycemic Control  Diabetes history: DM2 Outpatient Diabetes medications: Levemir 60 units QHS, Novolog 30 units TID with meals, Glipizide XL 10 mg BID, Metformin 1000 mg BID Current orders for Inpatient glycemic control: None; in ED at this time  Inpatient Diabetes Program Recommendations:  Insulin - Basal: If patient is admitted, please consider ordering Levemir 20 units Q24H (based on 83.2 kg x 0.25 units). Correction (SSI): If patient is admitted, please consider ordering CBGs with Novolog 0-15 units Q4H (if NPO) or ACHS (if diet ordered). HgbA1C: Please consider ordering an A1C to evaluate glycemic control over the past 2-3 months.  Thanks, Orlando Penner, RN, MSN, CDE Diabetes Coordinator Inpatient Diabetes Program [phone] (Team Pager from 8am to 5pm)

## 2018-04-20 NOTE — ED Notes (Signed)
Pt taken to CT at this time.

## 2018-04-20 NOTE — ED Notes (Signed)
Pt returned from ultrasound

## 2018-04-20 NOTE — ED Notes (Signed)
This RN to bedside at this time, introduced self to patient and family at bedside. Pt c/o pain returning at this time. Will continue to monitor for further patient needs.

## 2018-04-21 DIAGNOSIS — N390 Urinary tract infection, site not specified: Secondary | ICD-10-CM

## 2018-04-21 DIAGNOSIS — N10 Acute tubulo-interstitial nephritis: Secondary | ICD-10-CM

## 2018-04-21 LAB — CBC
HEMATOCRIT: 30.4 % — AB (ref 35.0–47.0)
Hemoglobin: 10.9 g/dL — ABNORMAL LOW (ref 12.0–16.0)
MCH: 29.9 pg (ref 26.0–34.0)
MCHC: 35.8 g/dL (ref 32.0–36.0)
MCV: 83.4 fL (ref 80.0–100.0)
Platelets: 133 10*3/uL — ABNORMAL LOW (ref 150–440)
RBC: 3.64 MIL/uL — ABNORMAL LOW (ref 3.80–5.20)
RDW: 13.8 % (ref 11.5–14.5)
WBC: 10.2 10*3/uL (ref 3.6–11.0)

## 2018-04-21 LAB — GLUCOSE, CAPILLARY
GLUCOSE-CAPILLARY: 253 mg/dL — AB (ref 70–99)
GLUCOSE-CAPILLARY: 242 mg/dL — AB (ref 70–99)
Glucose-Capillary: 341 mg/dL — ABNORMAL HIGH (ref 70–99)
Glucose-Capillary: 300 mg/dL — ABNORMAL HIGH (ref 70–99)

## 2018-04-21 LAB — BASIC METABOLIC PANEL
Anion gap: 8 (ref 5–15)
BUN: 21 mg/dL — AB (ref 6–20)
CO2: 19 mmol/L — ABNORMAL LOW (ref 22–32)
Calcium: 7.5 mg/dL — ABNORMAL LOW (ref 8.9–10.3)
Chloride: 105 mmol/L (ref 98–111)
Creatinine, Ser: 1 mg/dL (ref 0.44–1.00)
GFR calc Af Amer: >60 mL/min (ref >60)
Glucose, Bld: 342 mg/dL — ABNORMAL HIGH (ref 70–99)
POTASSIUM: 3.3 mmol/L — AB (ref 3.5–5.1)
Sodium: 132 mmol/L — ABNORMAL LOW (ref 135–145)

## 2018-04-21 LAB — HEMOGLOBIN A1C
HEMOGLOBIN A1C: 12.1 % — AB (ref 4.8–5.6)
MEAN PLASMA GLUCOSE: 301 mg/dL

## 2018-04-21 LAB — LACTIC ACID, PLASMA: LACTIC ACID, VENOUS: 2.2 mmol/L — AB (ref 0.5–1.9)

## 2018-04-21 MED ORDER — INSULIN DETEMIR 100 UNIT/ML ~~LOC~~ SOLN
60.0000 [IU] | Freq: Every day | SUBCUTANEOUS | Status: DC
  Administered 2018-04-21 – 2018-04-22 (×2): 60 [IU] via SUBCUTANEOUS
  Filled 2018-04-21 (×3): qty 0.6

## 2018-04-21 MED ORDER — GLUCERNA SHAKE PO LIQD
237.0000 mL | Freq: Two times a day (BID) | ORAL | Status: DC
  Administered 2018-04-21 – 2018-04-23 (×4): 237 mL via ORAL

## 2018-04-21 MED ORDER — POTASSIUM CHLORIDE CRYS ER 20 MEQ PO TBCR
20.0000 meq | EXTENDED_RELEASE_TABLET | Freq: Once | ORAL | Status: AC
  Administered 2018-04-21: 20 meq via ORAL
  Filled 2018-04-21: qty 1

## 2018-04-21 NOTE — Progress Notes (Signed)
Inpatient Diabetes Program Recommendations  AACE/ADA: New Consensus Statement on Inpatient Glycemic Control (2015)  Target Ranges:  Prepandial:   less than 140 mg/dL      Peak postprandial:   less than 180 mg/dL (1-2 hours)      Critically ill patients:  140 - 180 mg/dL   Results for MANDEE, PLUTA (MRN [id number]) as of 04/21/2018 10:30  Ref. Range 04/20/2018 08:32 04/20/2018 13:08 04/20/2018 16:41 04/20/2018 16:45 04/20/2018 21:15  Glucose-Capillary Latest Ref Range: 70 - 99 mg/dL 409 (H)  10 units NOVOLOG at 5:30am 320 (H)  11 units NOVOLOG  434 (H)  15 units NOVOLOG +  45 units LEVEMIR  396 (H) 354 (H)  5 units NOVOLOG    Results for LAELYNN, BLIZZARD (MRN [id number]) as of 04/21/2018 10:30  Ref. Range 04/21/2018 07:33  Glucose-Capillary Latest Ref Range: 70 - 99 mg/dL 956 (H)  28 units NOVOLOG    Results for DIEDRE, MACLELLAN (MRN [id number]) as of 04/21/2018 10:30  Ref. Range 04/20/2018 03:08  Hemoglobin A1C Latest Ref Range: 4.8 - 5.6 % 12.1 (H)  (301 mg/dl)    Admit with: Sepsis/ Possible Pyelonephritis/ Further work-up for renal cysts  History: DM  Home DM Meds: Glipizide 10 mg BID        Novolog 30 units TID with meals        Levemir 60 units QHS        Metformin 1000 mg BID  Current Orders: Levemir 60 units QHS       Novolog Moderate Correction Scale/ SSI (0-15 units) TID AC + HS      Novolog 20 units TID with meals    PCP: [facility]  Note Levemir dose increased for tonight.  Current A1c of 12.1% shows poor glucose control at home.  Needs close follow up with PCP group after discharge.     --Will follow patient during hospitalization--  Ambrose Finland RN, MSN, CDE Diabetes Coordinator Inpatient Glycemic Control Team Team Pager: [phone] (8a-5p)

## 2018-04-21 NOTE — Discharge Instructions (Signed)
Fingerstick glucose (sugar) goals for home: Before meals: 80-130 mg/dl 2-Hours after meals: less than 180 mg/dl Hemoglobin Z6X goal: 7% or less  Try to check your sugar levels at least three (3) times per day (either before a meal or 2-hours after a meal) and record all results for your MD.  Check with [facility] Endocrinology about getting an appointment for help with your diabetes [facility] [phone]   Pielonefritis en los adultos (Pyelonephritis, Adult) La pielonefritis es una infeccin del rin. Los riones son los rganos que filtran la sangre y [employer] residuos del torrente sanguneo a travs de la orina. La orina pasa desde los riones, a travs de los urteres, Wellsite geologist la vejiga. Hay dos tipos principales de pielonefritis:  Infecciones que se inician rpidamente sin sntomas previos (pielonefritis aguda).  Infecciones que persisten durante un perodo prolongado (pielonefritis crnica). En la [employer], la infeccin desaparece con el tratamiento y no causa otros problemas. Las infecciones ms graves o crnicas a veces pueden propagarse al torrente sanguneo u ocasionar otros problemas en los riones. CAUSAS Por lo general, entre las causas de esta afeccin, se incluyen las siguientes:  Bacterias que pasan desde la vejiga al rin a travs de la orina infectada. La orina de la vejiga puede infectarse por bacterias relacionadas con estas causas: ? Infeccin en la vejiga (cistitis). ? Inflamacin de la prstata (prostatitis). ? Relaciones sexuales en las mujeres.  Bacterias que pasan del torrente sanguneo al rin. FACTORES DE RIESGO Es ms probable que esta afeccin se manifieste en:  Las embarazadas.  Las personas de edad avanzada.  Los diabticos.  Las personas que tienen clculos en los riones o la vejiga.  Las personas que tienen otras anomalas en el rin o los urteres.  Las personas que tienen una sonda vesical.  Las personas  con Database administrator.  Las personas que son sexualmente activas.  Las mujeres que usan espermicidas.  Las personas que han tenido una infeccin previa en las vas [place]. SNTOMAS Los sntomas de esta afeccin incluyen lo siguiente:  Ganas frecuentes de Geographical information systems officer.  Necesidad intensa o persistente de Geographical information systems officer.  Sensacin de ardor o escozor al [employer].  Dolor abdominal.  Dolor de espalda.  Dolor al costado del cuerpo o en la fosa lumbar.  Grant Ruts.  Escalofros.  Sangre en la [place].  Nuseas.  Vmitos. DIAGNSTICO Esta afeccin se puede diagnosticar en funcin de lo siguiente:  Examen fsico e historia clnica.  Anlisis de [place].  Anlisis de [place]. Tambin pueden [employer] de diagnstico por imgenes de los riones, por [place], una ecografa o una tomografa computarizada. TRATAMIENTO El tratamiento de esta afeccin puede depender de la gravedad de la infeccin.  Si la infeccin es leve y se detecta rpidamente, pueden administrarle antibiticos por va oral. Deber tomar lquido para permanecer hidratado.  Si la infeccin es ms grave, es posible que deban hospitalizarlo para administrarle antibiticos directamente en una vena a travs de una va intravenosa (IV). Quizs tambin deban administrarle lquidos a travs de una va intravenosa si no se encuentra bien hidratado. Despus de la hospitalizacin, es posible que deba tomar antibiticos durante un [place]. Podrn prescribirle otros tratamientos segn la causa de la infeccin. INSTRUCCIONES PARA EL CUIDADO EN EL HOGAR Medicamentos  [employer] de venta libre y los recetados solamente como se lo haya indicado el mdico.  Si le recetaron un antibitico, tmelo como se lo haya indicado el mdico. No deje de tomar los antibiticos aunque comience  a sentirse mejor. Instrucciones generales  Beba suficiente lquido para mantener la orina clara o de color amarillo plido.  Evite la cafena, el  t y las [address]. Estas sustancias irritan la vejiga.  Orine con frecuencia. Evite retener la orina durante largos perodos.  Orine antes y despus de las [employer].  Despus de defecar, las mujeres deben higienizarse la regin perineal desde adelante hacia atrs. Use cada trozo de papel higinico solo una vez.  Concurra a todas las visitas de control como se lo haya indicado el mdico. Esto es importante. SOLICITE ATENCIN MDICA SI:  Los sntomas no mejoran despus de 2das de tratamiento.  Los sntomas empeoran.  Tiene fiebre. SOLICITE ATENCIN MDICA DE INMEDIATO SI:  No puede tomar los antibiticos ni ingerir lquidos.  Comienza a sentir escalofros.  Vomita.  Siente un dolor intenso en la espalda o en la fosa lumbar.  Se desmaya o siente una debilidad extrema. Esta informacin no tiene Theme park manager el consejo del mdico. Asegrese de hacerle al mdico cualquier pregunta que tenga. Document Released: 04/14/2005 Document Revised: 03/26/2015 Document Reviewed: 10/28/2014 Elsevier Interactive Patient Education  [employer].

## 2018-04-21 NOTE — Progress Notes (Signed)
Initial Nutrition Assessment  DOCUMENTATION CODES:   Obesity unspecified  INTERVENTION:   - Glucerna Shake po BID, each supplement provides 220 kcal and 10 grams of protein  NUTRITION DIAGNOSIS:   Inadequate oral intake related to poor appetite, nausea as evidenced by per patient/family report.  GOAL:   Patient will meet greater than or equal to 90% of their needs  MONITOR:   PO intake, Supplement acceptance, I & O's, Labs, Weight trends  REASON FOR ASSESSMENT:   Malnutrition Screening Tool    ASSESSMENT:   52 year old female who presented to the ED on 10/3 with abdominal pain and nausea. PMH significant for diabetes mellitus, hypertension, and hyperlipidemia. Pt found to have UTI and likely right pyelonephritis.  Spoke with pt's son and husband at bedside. Pt sleeping soundly and did not awaken to RD voice.  Pt's son states that pt's appetite has been poor since Sunday and that this often occurs when pt is sick. Pt's son shares that pt has not had much to eat since Sunday due to nausea and pain.  Pt's son reports that pt has been gradually losing weight over the past 1-2 years. Pt's son and husband are unsure of pt's UBW or the amount of weight pt has lost. Pt's family members believe pt has been losing weight due to frequent illness and stress from work.  When pt is feeling well, she typically eats several meals daily. Pt typically drinks water and juice and has been trying to stay away from soda.  Meal Completion: 100%  Medications reviewed and include: sliding scale Novolog, Novolog 20 units TID with meals, Levemir 60 units daily, Protonix 40 mg daily, Miralax BID, K-dur 20 mEq once, IV antibiotics  Labs reviewed: sodium 132 (L), potassium 3.3 (L), BUN 21 (H) CBG's: 341, 253, 354, 396, 434 x 24 hours  NUTRITION - FOCUSED PHYSICAL EXAM:  Deferred. Pt sleeping with multiple family members in room at time of visit. Will re-attempt at follow-up.  Diet Order:   Diet  Order            Diet Carb Modified Fluid consistency: Thin; Room service appropriate? Yes  Diet effective now              EDUCATION NEEDS:   No education needs have been identified at this time  Skin:  Skin Assessment: Reviewed RN Assessment  Last BM:  10/3  Height:   Ht Readings from Last 1 Encounters:  04/20/18 5\' 1"  (1.549 m)    Weight:   Wt Readings from Last 1 Encounters:  04/20/18 83.2 kg    Ideal Body Weight:  47.73 kg  BMI:  Body mass index is 34.66 kg/m.  Estimated Nutritional Needs:   Kcal:  1650-1850  Protein:  80-95 grams  Fluid:  1.7-1.9 L    Earma Reading, MS, RD, LDN Inpatient Clinical Dietitian Pager: [phone] Weekend/After Hours: [phone]

## 2018-04-21 NOTE — Progress Notes (Signed)
Home DM Meds: Glipizide 10 mg BID                              Novolog 30 units TID with meals                              Levemir 60 units QHS                              Metformin 1000 mg BID  Current Orders: Levemir 60 units QHS                            Novolog Moderate Correction Scale/ SSI (0-15 units) TID AC + HS                            Novolog 20 units TID with meals   [facility]P: [facility]  Note Levemir dose increased for tonight.  Current A1c of 12.1% shows poor glucose control at home.  Needs close follow up with PCP group after discharge.   Met with pt and family today.  Pt gave me permission to speak with her with her family present.  Used pt's son, husband, and daughter to help with translation (placed interpreter request at 12:15pm but request never accepted online on the homepage--interpreter arrived at 1:05pm after I had already spoken with pt).  Pt sees Physician at the [facility].  Gets medications thru the clinic as well.  Has [employer].  Only checking CBGs 2-3 times per week.  Drinks regular drinks and does not follow diabetes diet.  Family asking for follow up nutrition appt after d/c to better help pt learn how to eat at home.  Will place [facility] to [facility] for appt with CDE for further diet education after d/c.  Takes medications as prescribed but endorses some skin irritation (rash) after injection of insulin.  Doesn't know if the Levemir or Novolog causing the rash.  Confirmed types and dosages of insulin with pt.  Reminded pt to rotate sites and recommended to pt to use the right side of the abdomen for the rapid-acting insulin (Novolog) (always remembering to rotate the sites on the right side) and to use the left side of the abdomen for the long-acting insulin (Levemir) (always remembering to rotate the sites on the left side as well).  I explained that this technique may help pt figure out which insulin is  causing the skin irritation.  Encouraged to to follow up with her PCP about this issue as she may need a different insulin if she continues to have a skin reaction.  Also discussed DM diet information with patient and family.  Encouraged patient to avoid beverages with sugar (regular soda, sweet tea, lemonade, fruit juice) and to consume mostly water.  Discussed what foods contain carbohydrates and how carbohydrates affect the body's blood sugar levels.  Encouraged patient to be careful with her portion sizes (especially grains, starchy vegetables, and fruits).    Spoke with patient and family about her current A1c of 12.1%.  Explained what an A1c is and what it measures.  Reminded patient thather goal A1c is 7% or less per ADA standards to prevent both acute and long-term complications.  Explained to patient the extreme importance of good glucose control at home.  Encouraged patient to check her CBGs at least TID AC (or 2 hours after meals) and to record all CBGs in a logbook for her PCP or Endocrinologist to review.  Reviewed glucose goals for home.  Also strongly encouraged pt and family to look into getting an appt with either Dr. Gabriel Carina or Dr. Honor Junes [facility] End[facility]) after d/c.     --Will follow patient during hospitalization--  Wyn Quaker RN, MSN, CDE Diabetes Coordinator Inpatient Glycemic Control Team Team Pager: [phone] (8a-5p)

## 2018-04-21 NOTE — Consult Note (Signed)
Consultation: Pyelonephritis Requested by: Dr. Enid Baas  History of Present Illness: Nicole Villegas is a 52 year old female who was admitted with nausea, abdominal pain and malaise and found to have bacteremia with E. coli and E. coli also on urinary culture.  Her UA only showed few bacteria with 11-20 white cells and 0-5 red cells.  Her white count was normal.  She underwent CT scan of the abdomen and pelvis which revealed ill-defined masslike areas in the right upper pole and right lower pole consistent with a pyelonephritis or lobar nephronia.  There was some concern for renal mass and so she underwent an MRI of the abdomen about 12 hours later and there were diffusion-weighted defects of the right upper and the right lower pole consistent with pyelonephritis.  The patient's daughters are here tonight to serve as interpreters.  The patient is feeling better and her pain has significantly improved.  She is on Rocephin with culture sensitivity pending.  The patient reports she has recurrent urinary tract infections with symptoms of dysuria.  This is been going on for about 2 years.  Antibiotics seem to resolve her symptoms.  At baseline she has frequency but not so much urgency.  She has leakage when she coughs or strains.  No pelvic surgery but 2 vaginal deliveries and 3 C-sections.  Past Medical History:  Diagnosis Date  . Diabetes mellitus without complication (HCC)   . Hyperlipidemia   . Hypertension    Past Surgical History:  Procedure Laterality Date  . CESAREAN SECTION     x3    Home Medications:  Medications Prior to Admission  Medication Sig Dispense Refill Last Dose  . atenolol (TENORMIN) 50 MG tablet Take 50 mg by mouth daily.   Past Week at Unknown time  . cyclobenzaprine (FLEXERIL) 5 MG tablet Take 5 mg by mouth daily as needed for muscle spasms.    unknown at unknown  . fexofenadine (ALLEGRA) 60 MG tablet Take 60 mg by mouth 2 (two) times daily as needed for allergies.     unknown at unknown  . gabapentin (NEURONTIN) 300 MG capsule Take 300 mg by mouth 2 (two) times daily.   Past Week at Unknown time  . GLIPIZIDE XL 10 MG 24 hr tablet Take 10 mg by mouth 2 (two) times daily.   Past Week at Unknown time  . insulin aspart (NOVOLOG) 100 UNIT/ML injection Inject 30 Units into the skin 3 (three) times daily before meals.   Past Week at Unknown time  . insulin detemir (LEVEMIR) 100 UNIT/ML injection Inject 60 Units into the skin at bedtime.   Past Week at Unknown time  . lisinopril-hydrochlorothiazide (PRINZIDE,ZESTORETIC) 20-12.5 MG tablet Take 2 tablets by mouth daily.   Past Week at Unknown time  . meloxicam (MOBIC) 15 MG tablet Take 15 mg by mouth daily.   Past Week at Unknown time  . metFORMIN (GLUCOPHAGE) 500 MG tablet Take 1,000 mg by mouth 2 (two) times daily.   Past Week at Unknown time  . omeprazole (PRILOSEC) 20 MG capsule Take 20 mg by mouth daily.   Past Week at Unknown time  . simvastatin (ZOCOR) 40 MG tablet Take 40 mg by mouth at bedtime.   Past Week at Unknown time   Allergies: No Known Allergies  History reviewed. No pertinent family history. Social History:  reports that she has never smoked. She has never used smokeless tobacco. Her alcohol and drug histories are not on file.  ROS: A complete review of  systems was performed.  All systems are negative except for pertinent findings as noted. Review of Systems  All other systems reviewed and are negative.    Physical Exam:  Vital signs in last 24 hours: Temp:  [98.6 F (37 C)-103.1 F (39.5 C)] 99.5 F (37.5 C) (10/04 1353) Pulse Rate:  [86-94] 90 (10/04 1353) Resp:  [20] 20 (10/04 1353) BP: (99-129)/(60-72) 103/60 (10/04 1353) SpO2:  [97 %] 97 % (10/04 1353) Weight:  [83 kg] 83 kg (10/03 2214) General:  Alert and oriented, No acute distress HEENT: Normocephalic, atraumatic Cardiovascular: Regular rate and rhythm Lungs: Regular rate and effort Abdomen: Soft, nontender, nondistended, no  abdominal masses Back: No CVA tenderness Extremities: mild LE edema Neurologic: Grossly intact  Laboratory Data:  Results for orders placed or performed during the hospital encounter of 04/20/18 (from the past 24 hour(s))  Glucose, capillary     Status: Abnormal   Collection Time: 04/20/18  9:15 PM  Result Value Ref Range   Glucose-Capillary 354 (H) 70 - 99 mg/dL  Basic metabolic panel     Status: Abnormal   Collection Time: 04/21/18  3:37 AM  Result Value Ref Range   Sodium 132 (L) 135 - 145 mmol/L   Potassium 3.3 (L) 3.5 - 5.1 mmol/L   Chloride 105 98 - 111 mmol/L   CO2 19 (L) 22 - 32 mmol/L   Glucose, Bld 342 (H) 70 - 99 mg/dL   BUN 21 (H) 6 - 20 mg/dL   Creatinine, Ser 1.61 0.44 - 1.00 mg/dL   Calcium 7.5 (L) 8.9 - 10.3 mg/dL   GFR calc non Af Amer >60 >60 mL/min   GFR calc Af Amer >60 >60 mL/min   Anion gap 8 5 - 15  CBC     Status: Abnormal   Collection Time: 04/21/18  3:37 AM  Result Value Ref Range   WBC 10.2 3.6 - 11.0 K/uL   RBC 3.64 (L) 3.80 - 5.20 MIL/uL   Hemoglobin 10.9 (L) 12.0 - 16.0 g/dL   HCT 09.6 (L) 04.5 - 40.9 %   MCV 83.4 80.0 - 100.0 fL   MCH 29.9 26.0 - 34.0 pg   MCHC 35.8 32.0 - 36.0 g/dL   RDW 81.1 91.4 - 78.2 %   Platelets 133 (L) 150 - 440 K/uL  Glucose, capillary     Status: Abnormal   Collection Time: 04/21/18  7:33 AM  Result Value Ref Range   Glucose-Capillary 253 (H) 70 - 99 mg/dL  Glucose, capillary     Status: Abnormal   Collection Time: 04/21/18 11:46 AM  Result Value Ref Range   Glucose-Capillary 341 (H) 70 - 99 mg/dL  Lactic acid, plasma     Status: Abnormal   Collection Time: 04/21/18 11:55 AM  Result Value Ref Range   Lactic Acid, Venous 2.2 (HH) 0.5 - 1.9 mmol/L  Glucose, capillary     Status: Abnormal   Collection Time: 04/21/18  4:33 PM  Result Value Ref Range   Glucose-Capillary 242 (H) 70 - 99 mg/dL   Recent Results (from the past 240 hour(s))  Urine culture     Status: Abnormal (Preliminary result)   Collection  Time: 04/20/18  3:08 AM  Result Value Ref Range Status   Specimen Description   Final    URINE, RANDOM Performed at [facility], [address], [place], [place] [address]    Special Requests   Final    NONE Performed at [facility], [address] [place], [place] [address]    Culture >=100,000 COLONIES/mL ESCHERICHIA COLI (A)  Final   Report Status PENDING  Incomplete  Culture, blood (routine x 2)     Status: Abnormal (Preliminary result)   Collection Time: 04/20/18  5:14 AM  Result Value Ref Range Status   Specimen Description   Final    BLOOD LEFT HAND Performed at [facility], [address], [place], [place] [address]    Special Requests   Final    BOTTLES DRAWN AEROBIC AND ANAEROBIC Blood Culture results may not be optimal due to an excessive volume of blood received in culture bottles Performed at [facility], [address] [place], [place] [address]    Culture  Setup Time   Final    GRAM NEGATIVE RODS IN BOTH AEROBIC AND ANAEROBIC BOTTLES CRITICAL RESULT CALLED TO, READ BACK BY AND VERIFIED WITH: JASON ROBBINS AT 1649 ON 04/20/18 [facility]. Performed at [facility], [address], [place], [place] [address]    Culture ESCHERICHIA COLI (A)  Final   Report Status PENDING  Incomplete  Blood Culture ID Panel (Reflexed)     Status: Abnormal   Collection Time: 04/20/18  5:14 AM  Result Value Ref Range Status   Enterococcus species NOT DETECTED NOT DETECTED Final   Listeria monocytogenes NOT DETECTED NOT DETECTED Final   Staphylococcus species NOT DETECTED NOT DETECTED Final   Staphylococcus aureus (BCID) NOT DETECTED NOT DETECTED Final   Streptococcus species NOT DETECTED NOT DETECTED Final   Streptococcus agalactiae NOT DETECTED NOT DETECTED Final   Streptococcus pneumoniae NOT DETECTED NOT DETECTED Final   Streptococcus pyogenes NOT DETECTED NOT DETECTED Final   Acinetobacter baumannii NOT DETECTED NOT DETECTED Final    Enterobacteriaceae species DETECTED (A) NOT DETECTED Final    Comment: Enterobacteriaceae represent a large family of gram-negative bacteria, not a single organism. CRITICAL RESULT CALLED TO, READ BACK BY AND VERIFIED WITH: JASON ROBBINS AT 1649 ON 04/20/18 [facility].    Enterobacter cloacae complex NOT DETECTED NOT DETECTED Final   Escherichia coli DETECTED (A) NOT DETECTED Final    Comment: CRITICAL RESULT CALLED TO, READ BACK BY AND VERIFIED WITH: JASON ROBBINS AT 1649 ON 04/20/18 [facility].    Klebsiella oxytoca NOT DETECTED NOT DETECTED Final   Klebsiella pneumoniae NOT DETECTED NOT DETECTED Final   Proteus species NOT DETECTED NOT DETECTED Final   Serratia marcescens NOT DETECTED NOT DETECTED Final   Carbapenem resistance NOT DETECTED NOT DETECTED Final   Haemophilus influenzae NOT DETECTED NOT DETECTED Final   Neisseria meningitidis NOT DETECTED NOT DETECTED Final   Pseudomonas aeruginosa NOT DETECTED NOT DETECTED Final   Candida albicans NOT DETECTED NOT DETECTED Final   Candida glabrata NOT DETECTED NOT DETECTED Final   Candida krusei NOT DETECTED NOT DETECTED Final   Candida parapsilosis NOT DETECTED NOT DETECTED Final   Candida tropicalis NOT DETECTED NOT DETECTED Final    Comment: Performed at [facility], [address] [place], [place] [address]  Culture, blood (routine x 2)     Status: None (Preliminary result)   Collection Time: 04/20/18  5:15 AM  Result Value Ref Range Status   Specimen Description BLOOD RIGHT WRIST  Final   Special Requests   Final    BOTTLES DRAWN AEROBIC AND ANAEROBIC Blood Culture results may not be optimal due to an excessive volume of blood received in culture bottles   Culture  Setup Time   Final    GRAM NEGATIVE  RODS IN BOTH AEROBIC AND ANAEROBIC BOTTLES CRITICAL VALUE NOTED.  VALUE IS CONSISTENT WITH PREVIOUSLY REPORTED AND CALLED VALUE. Performed at [facility], [address] [place], [place] [address]    Culture GRAM NEGATIVE  RODS  Final   Report Status PENDING  Incomplete   Creatinine: Recent Labs    04/20/18 0308 04/21/18 0337  CREATININE 0.99 1.00    Impression/Assessment/plan:  1) pyelonephritis-she is improving on antibiotics and sensitivities pending.  We can consider follow-up imaging or ultrasound as an outpatient.  She should be rescanned or ultrasound obtained if she spikes fevers as she could develop a renal abscess.  2) recurrent UTI- no findings on CT that would predispose her to UTI.  Neurogenic risk includes diabetes.  She should follow-up in outpatient urology for a post void residual check.  Will sign off.  Follow-up on chart and discussed with patient and daughters importance of follow-up.  Please page GU with any questions or concerns or changes in patient's status.  Thank you.  Jerilee Field 04/21/2018, 8:12 PM

## 2018-04-21 NOTE — Progress Notes (Signed)
[facility] - [place] at [facility]   PATIENT NAME: Nicole Villegas    MR#:  [id number]  DATE OF BIRTH:  1965-08-06  SUBJECTIVE:  CHIEF COMPLAINT:   Chief Complaint  Patient presents with  . Abdominal Pain   -Bilateral flank pain worse on the right side. -Still spiking fevers of 103 F -Positive blood cultures  REVIEW OF SYSTEMS:  Review of Systems  Constitutional: Positive for chills and fever. Negative for malaise/fatigue.  HENT: Negative for ear discharge, hearing loss and nosebleeds.   Eyes: Negative for blurred vision and double vision.  Respiratory: Negative for cough, shortness of breath and wheezing.   Cardiovascular: Negative for chest pain, palpitations and leg swelling.  Gastrointestinal: Negative for abdominal pain, constipation, diarrhea, nausea and vomiting.  Genitourinary: Positive for flank pain. Negative for dysuria.  Musculoskeletal: Positive for myalgias.  Neurological: Negative for dizziness, focal weakness, seizures, weakness and headaches.  Psychiatric/Behavioral: Negative for depression.    DRUG ALLERGIES:  No Known Allergies  VITALS:  Blood pressure 103/60, pulse 90, temperature 99.5 F (37.5 C), temperature source Oral, resp. rate 20, height 5\' 1"  (1.549 m), weight 83.2 kg, SpO2 97 %.  PHYSICAL EXAMINATION:  Physical Exam  GENERAL:  52 y.o.-year-old obese patient lying in the bed with no acute distress.  EYES: Pupils equal, round, reactive to light and accommodation. No scleral icterus. Extraocular muscles intact.  HEENT: Head atraumatic, normocephalic. Oropharynx and nasopharynx clear.  NECK:  Supple, no jugular venous distention. No thyroid enlargement, no tenderness.  LUNGS: Normal breath sounds bilaterally, no wheezing, rales,rhonchi or crepitation. No use of accessory muscles of respiration.  CARDIOVASCULAR: S1, S2 normal. No murmurs, rubs, or gallops.  ABDOMEN: Soft, nontender, nondistended.  Right flank discomfort and  right CVA tenderness noted.  bowel sounds present. No organomegaly or mass.  EXTREMITIES: No pedal edema, cyanosis, or clubbing.  NEUROLOGIC: Cranial nerves II through XII are intact. Muscle strength 5/5 in all extremities. Sensation intact. Gait not checked.  PSYCHIATRIC: The patient is alert and oriented x 3.  SKIN: No obvious rash, lesion, or ulcer.    LABORATORY PANEL:   CBC Recent Labs  Lab 04/21/18 0337  WBC 10.2  HGB 10.9*  HCT 30.4*  PLT 133*   ------------------------------------------------------------------------------------------------------------------  Chemistries  Recent Labs  Lab 04/20/18 0308 04/21/18 0337  NA 129* 132*  K 3.7 3.3*  CL 92* 105  CO2 23 19*  GLUCOSE 533* 342*  BUN 21* 21*  CREATININE 0.99 1.00  CALCIUM 8.8* 7.5*  AST 22  --   ALT 17  --   ALKPHOS 83  --   BILITOT 1.8*  --    ------------------------------------------------------------------------------------------------------------------  Cardiac Enzymes Recent Labs  Lab 04/20/18 0515  TROPONINI <0.03   ------------------------------------------------------------------------------------------------------------------  RADIOLOGY:  Mr Abdomen W Wo Contrast  Result Date: 04/21/2018 CLINICAL DATA:  Suspected pyelonephritis with renal lesions on CT EXAM: MRI ABDOMEN WITHOUT AND WITH CONTRAST TECHNIQUE: Multiplanar multisequence MR imaging of the abdomen was performed both before and after the administration of intravenous contrast. CONTRAST:  8 mL Gadovist IV COMPARISON:  CT abdomen/pelvis dated 04/20/2018 FINDINGS: Lower chest: Lung bases are clear. Hepatobiliary: Liver is within normal limits. No suspicious enhancing hepatic lesions. Gallbladder is unremarkable. No intrahepatic or extrahepatic ductal dilatation. Pancreas:  Within normal limits. Spleen:  Within normal limits. Adrenals/Urinary Tract:  Adrenal glands within normal limits. Left kidney is within normal limits. No findings  suspicious for solid right renal mass on T2. Wedge-shaped hypoperfusion of the medial right  lower kidney following contrast administration (series 17/image 33), with corresponding restricted diffusion (series 8/image 26), suspicious for pyelonephritis. Additional foci of suspected pyelonephritis in the anterior right upper kidney (series 13/images 54 and 62), again noted on diffusion imaging (series 8/images 19 and 22). No hydronephrosis. Stomach/Bowel: Stomach is within normal limits. Visualized bowel is unremarkable. Vascular/Lymphatic:  No evidence of abdominal aortic aneurysm. No suspicious abdominal lymphadenopathy. Other:  No abdominal ascites. Musculoskeletal: No focal osseous lesions. IMPRESSION: No findings suspicious for solid renal mass. Suspected multifocal pyelonephritis involving the right kidney. If clinically warranted, follow-up CT or MRI abdomen with/without contrast can be performed in 3 months to document resolution. Electronically Signed   By: Charline Bills M.D.   On: 04/21/2018 00:33   Ct Abdomen Pelvis W Contrast  Result Date: 04/20/2018 CLINICAL DATA:  Abdominal pain and nausea EXAM: CT ABDOMEN AND PELVIS WITH CONTRAST TECHNIQUE: Multidetector CT imaging of the abdomen and pelvis was performed using the standard protocol following bolus administration of intravenous contrast. Oral contrast was also administered. CONTRAST:  ISOVUE-300 IOPAMIDOL (ISOVUE-300) INJECTION 61% COMPARISON:  None. FINDINGS: Lower chest: Lung bases are clear. Hepatobiliary: There is hepatic steatosis. No focal liver lesions are appreciable. There is no gallbladder wall thickening. There is no appreciable biliary duct dilatation. Pancreas: There is no pancreatic mass or inflammatory focus. Spleen: No splenic lesions are evident. Adrenals/Urinary Tract: Adrenals bilaterally appear normal. There is a subtle area of decreased attenuation in the anterior segment of the midportion of the right kidney measuring  2.5 x 2.1 cm. This abnormal attenuation is somewhat more evident on axial images than on the re-formatted images. There is vague suggestion of a lesion in the mid left kidney on coronal slice 64 series 5. More inferiorly in the right kidney, there is an area of decreased attenuation which is better appreciated on the sagittal and coronal images measuring 2.5 x 1.9 x 1.9 cm. No left-sided renal masses evident. There is no evident hydronephrosis on either side. There is no renal or ureteral calculus on either side. Urinary bladder is midline with wall thickness within normal limits. Stomach/Bowel: There is no appreciable bowel wall or mesenteric thickening. There is moderate stool in the colon. There is no appreciable bowel obstruction. There is no free air or portal venous air. Vascular/Lymphatic: There is aortic atherosclerosis. No aneurysm evident. Major mesenteric arterial vessels appear patent. Major venous structures also appear patent. Note that the left renal vein in particular is patent. There is no appreciable adenopathy in the abdomen or pelvis. Reproductive: Uterus is anteverted and mildly canted to the left. No evident pelvic mass. Other: Appendix appears unremarkable. There is no abscess or ascites in the abdomen or pelvis. Musculoskeletal: Degenerative changes noted in the lumbar spine. No blastic or lytic bone lesions. There is no intramuscular or abdominal wall lesion evident. IMPRESSION: 1. Solid-appearing mass arising from the lower pole of the left kidney measuring 2.5 x 1.9 x 1.9 cm. Questionable second mass along the midportion of the right kidney, well seen only on the axial view measuring 2.5 x 2.1 cm. The possibility of renal cell carcinoma within the right kidney must be of concern. Foci of infection are also possible. Given concern for potential neoplasm, would advise pre and post-contrast MR of the kidneys to further evaluate. Correlation with urinalysis with respect to potential areas of  infection within the right kidney advised. 2. No hydronephrosis on either side. No evident renal or ureteral calculus on either side. 3. No evident bowel obstruction.  No abscess in the abdomen or pelvis. Appendix appears normal. 4.  Aortic atherosclerosis. 5.  Hepatic steatosis. Aortic Atherosclerosis (ICD10-I70.0). These results were called by telephone at the time of interpretation on 04/20/2018 at 10:28 am to Dr. Jene Every , who verbally acknowledged these results. Electronically Signed   By: Bretta Bang III M.D.   On: 04/20/2018 10:29   US Abdomen Limited Ruq  Result Date: 04/20/2018 CLINICAL DATA:  Right upper quadrant pain. EXAM: ULTRASOUND ABDOMEN LIMITED RIGHT UPPER QUADRANT COMPARISON:  Right upper quadrant ultrasound 09/22/2015 FINDINGS: Gallbladder: Physiologically distended, fold in the fundus. No gallstones or wall thickening visualized. No pericholecystic fluid. No sonographic Murphy sign noted by sonographer. Common bile duct: Diameter: 3 mm, normal. Liver: No focal lesion identified. Diffusely increased in parenchymal echogenicity. Liver parenchyma is difficult to penetrate. Portal vein is patent on color Doppler imaging with normal direction of blood flow towards the liver. IMPRESSION: 1. Normal sonographic appearance of the gallbladder and biliary tree. 2. Hepatic steatosis. Electronically Signed   By: Narda Rutherford M.D.   On: 04/20/2018 06:51    EKG:   Orders placed or performed during the hospital encounter of 04/20/18  . EKG 12-Lead  . EKG 12-Lead    ASSESSMENT AND PLAN:   52 year old female with past medical history significant for insulin-dependent diabetes mellitus, hypertension presents to hospital secondary to sepsis  1.  Septic shock-secondary to E. coli UTI and also bacteremia -Significant pyelonephritis noted on CT. -Continue to follow for sensitivities. still spiking fevers -Lactic acid is elevated.  Follow-up.  Continue IV fluids -Currently on  Rocephin.  If no improvement noted-consider changing to Carbapenem's -Renal function is within normal limits  2.  Bilateral renal masses-CT of the abdomen concerning for solid renal mass in both left and right. -Urology has been consulted -Follow-up MRI did not show any suspicious masses.  Likely multifocal pyelonephritis especially involving the right kidney. -Continue IV antibiotics  3.  Uncontrolled diabetes-insulin-dependent.  Check A1c. -On Lantus, pre-meal insulin sliding scale insulin  4.  Hypokalemia-being replaced  5.  DVT prophylaxis-Lovenox  Patient is independent and ambulatory at baseline    All the records are reviewed and case discussed with Care Management/Social Workerr. Management plans discussed with the patient, family and they are in agreement.  CODE STATUS: Full Code  TOTAL TIME TAKING CARE OF THIS PATIENT: 38 minutes.   POSSIBLE D/C IN 2 DAYS, DEPENDING ON CLINICAL CONDITION.   Varun Jourdan M.D on 04/21/2018 at 1:57 PM  Between 7am to 6pm - Pager - [phone]  After 6pm go to [email or web address] - Social research officer, government  [facility] [place] [facility]  Office  [phone]  CC: Primary care physician; Verlee Monte, PA-C

## 2018-04-22 LAB — CBC
HEMATOCRIT: 29.4 % — AB (ref 35.0–47.0)
Hemoglobin: 10.1 g/dL — ABNORMAL LOW (ref 12.0–16.0)
MCH: 29 pg (ref 26.0–34.0)
MCHC: 34.2 g/dL (ref 32.0–36.0)
MCV: 84.8 fL (ref 80.0–100.0)
Platelets: 131 10*3/uL — ABNORMAL LOW (ref 150–440)
RBC: 3.47 MIL/uL — ABNORMAL LOW (ref 3.80–5.20)
RDW: 14.8 % — AB (ref 11.5–14.5)
WBC: 9.1 10*3/uL (ref 3.6–11.0)

## 2018-04-22 LAB — CULTURE, BLOOD (ROUTINE X 2)

## 2018-04-22 LAB — HEMOGLOBIN A1C
Hgb A1c MFr Bld: 12 % — ABNORMAL HIGH (ref 4.8–5.6)
Mean Plasma Glucose: 298 mg/dL

## 2018-04-22 LAB — BASIC METABOLIC PANEL
ANION GAP: 4 — AB (ref 5–15)
BUN: 20 mg/dL (ref 6–20)
CALCIUM: 7.7 mg/dL — AB (ref 8.9–10.3)
CO2: 22 mmol/L (ref 22–32)
Chloride: 112 mmol/L — ABNORMAL HIGH (ref 98–111)
Creatinine, Ser: 0.75 mg/dL (ref 0.44–1.00)
GFR calc Af Amer: >60 mL/min (ref >60)
GFR calc non Af Amer: >60 mL/min (ref >60)
GLUCOSE: 244 mg/dL — AB (ref 70–99)
POTASSIUM: 3.5 mmol/L (ref 3.5–5.1)
Sodium: 138 mmol/L (ref 135–145)

## 2018-04-22 LAB — URINE CULTURE: Culture: >=100000 — AB

## 2018-04-22 LAB — GLUCOSE, CAPILLARY
GLUCOSE-CAPILLARY: 231 mg/dL — AB (ref 70–99)
GLUCOSE-CAPILLARY: 263 mg/dL — AB (ref 70–99)
GLUCOSE-CAPILLARY: 288 mg/dL — AB (ref 70–99)
Glucose-Capillary: 219 mg/dL — ABNORMAL HIGH (ref 70–99)

## 2018-04-22 LAB — HIV ANTIBODY (ROUTINE TESTING W REFLEX): HIV SCREEN 4TH GENERATION: NONREACTIVE

## 2018-04-22 LAB — LACTIC ACID, PLASMA: Lactic Acid, Venous: 1.3 mmol/L (ref 0.5–1.9)

## 2018-04-22 NOTE — Progress Notes (Signed)
[facility] - [place] at [facility]   PATIENT NAME: Nicole Villegas    MR#:  [id number]  DATE OF BIRTH:  1965/12/03  SUBJECTIVE:   Presented to the hospital due to abdominal pain and nausea and noted to have urinary tract infection with pyelonephritis.  Initially patient CT did show renal masses but MRI of the abdomen does not show any evidence of renal masses.  Patient symptoms a likely secondary to pyelonephritis.  Abdominal pain is improved, still continues to spike low-grade fevers.  Patient noted to have bacteremia.  REVIEW OF SYSTEMS:    Review of Systems  Constitutional: Negative for chills and fever.  HENT: Negative for congestion and tinnitus.   Eyes: Negative for blurred vision and double vision.  Respiratory: Negative for cough, shortness of breath and wheezing.   Cardiovascular: Negative for chest pain, orthopnea and PND.  Gastrointestinal: Negative for abdominal pain, diarrhea, nausea and vomiting.  Genitourinary: Negative for dysuria and hematuria.  Neurological: Positive for weakness (generalized). Negative for dizziness, sensory change and focal weakness.  All other systems reviewed and are negative.   Nutrition: Carb modified Tolerating Diet: Yes Tolerating PT: Ambulatory     DRUG ALLERGIES:  No Known Allergies  VITALS:  Blood pressure 130/89, pulse 87, temperature 98.9 F (37.2 C), temperature source Oral, resp. rate 20, height 5\' 1"  (1.549 m), weight 83.2 kg, SpO2 95 %.  PHYSICAL EXAMINATION:   Physical Exam  GENERAL:  52 y.o.-year-old hispanic patient lying in bed in no acute distress.  EYES: Pupils equal, round, reactive to light and accommodation. No scleral icterus. Extraocular muscles intact.  HEENT: Head atraumatic, normocephalic. Oropharynx and nasopharynx clear.  NECK:  Supple, no jugular venous distention. No thyroid enlargement, no tenderness.  LUNGS: Normal breath sounds bilaterally, no wheezing, rales, rhonchi. No use of  accessory muscles of respiration.  CARDIOVASCULAR: S1, S2 normal. No murmurs, rubs, or gallops.  ABDOMEN: Soft, nontender, nondistended. Bowel sounds present. No organomegaly or mass.  EXTREMITIES: No cyanosis, clubbing or edema b/l.    NEUROLOGIC: Cranial nerves II through XII are intact. No focal Motor or sensory deficits b/l. Globally weak.    PSYCHIATRIC: The patient is alert and oriented x 3.  SKIN: No obvious rash, lesion, or ulcer.    LABORATORY PANEL:   CBC Recent Labs  Lab 04/22/18 0407  WBC 9.1  HGB 10.1*  HCT 29.4*  PLT 131*   ------------------------------------------------------------------------------------------------------------------  Chemistries  Recent Labs  Lab 04/20/18 0308  04/22/18 0407  NA 129*   < > 138  K 3.7   < > 3.5  CL 92*   < > 112*  CO2 23   < > 22  GLUCOSE 533*   < > 244*  BUN 21*   < > 20  CREATININE 0.99   < > 0.75  CALCIUM 8.8*   < > 7.7*  AST 22  --   --   ALT 17  --   --   ALKPHOS 83  --   --   BILITOT 1.8*  --   --    < > = values in this interval not displayed.   ------------------------------------------------------------------------------------------------------------------  Cardiac Enzymes Recent Labs  Lab 04/20/18 0515  TROPONINI <0.03   ------------------------------------------------------------------------------------------------------------------  RADIOLOGY:  Mr Abdomen W Wo Contrast  Result Date: 04/21/2018 CLINICAL DATA:  Suspected pyelonephritis with renal lesions on CT EXAM: MRI ABDOMEN WITHOUT AND WITH CONTRAST TECHNIQUE: Multiplanar multisequence MR imaging of the abdomen was performed both before and after the  administration of intravenous contrast. CONTRAST:  8 mL Gadovist IV COMPARISON:  CT abdomen/pelvis dated 04/20/2018 FINDINGS: Lower chest: Lung bases are clear. Hepatobiliary: Liver is within normal limits. No suspicious enhancing hepatic lesions. Gallbladder is unremarkable. No intrahepatic or  extrahepatic ductal dilatation. Pancreas:  Within normal limits. Spleen:  Within normal limits. Adrenals/Urinary Tract:  Adrenal glands within normal limits. Left kidney is within normal limits. No findings suspicious for solid right renal mass on T2. Wedge-shaped hypoperfusion of the medial right lower kidney following contrast administration (series 17/image 33), with corresponding restricted diffusion (series 8/image 26), suspicious for pyelonephritis. Additional foci of suspected pyelonephritis in the anterior right upper kidney (series 13/images 54 and 62), again noted on diffusion imaging (series 8/images 19 and 22). No hydronephrosis. Stomach/Bowel: Stomach is within normal limits. Visualized bowel is unremarkable. Vascular/Lymphatic:  No evidence of abdominal aortic aneurysm. No suspicious abdominal lymphadenopathy. Other:  No abdominal ascites. Musculoskeletal: No focal osseous lesions. IMPRESSION: No findings suspicious for solid renal mass. Suspected multifocal pyelonephritis involving the right kidney. If clinically warranted, follow-up CT or MRI abdomen with/without contrast can be performed in 3 months to document resolution. Electronically Signed   By: Charline Bills M.D.   On: 04/21/2018 00:33     ASSESSMENT AND PLAN:   52 year old female w/ hx of DM, HTN, Hyperlipidemia who presented to the hospital due to abdominal pain nausea and noted to have acute pyelonephritis.  1.  Sepsis- patient meets criteria given her fever, leukocytosis, tachycardia. -Source of sepsis is UTI with pyelonephritis. - Continue IV ceftriaxone, IV fluids.  Patient is now hemodynamically stable.  Follow blood cultures, urine cultures are positive for E. coli which is pansensitive.  2.  Acute pyelonephritis-source of patient's sepsis. -Continue IV ceftriaxone, IV fluids, antiemetics, supportive care.  Follow urine, blood cultures.  3.  Diabetes type 2 without complication-continue Levemir, sliding scale insulin.   Follow blood sugars.  4.  Essential hypertension-continue atenolol.  5.  Diabetic neuropathy-continue gabapentin.  6. Hyperlipidemia - cont. Simvastatin.   Likely discharge home tomorrow.   All the records are reviewed and case discussed with Care Management/Social Worker. Management plans discussed with the patient, family and they are in agreement.  CODE STATUS: Full code  DVT Prophylaxis: Lovenox  TOTAL TIME TAKING CARE OF THIS PATIENT: 30 minutes.   POSSIBLE D/C IN 1-2 DAYS, DEPENDING ON CLINICAL CONDITION.   Houston Siren M.D on 04/22/2018 at 2:23 PM  Between 7am to 6pm - Pager - [phone]  After 6pm go to [email or web address] - Scientist, research (life sciences) [facility]  Office  [phone]  CC: Primary care physician; Verlee Monte, PA-C

## 2018-04-22 NOTE — Plan of Care (Signed)
  Problem: Education: Goal: Knowledge of General Education information will improve Description Including pain rating scale, medication(s)/side effects and non-pharmacologic comfort measures 04/22/2018 2338 by Myles Gip, RN Outcome: Progressing 04/22/2018 2338 by Myles Gip, RN Outcome: Progressing   Problem: Health Behavior/Discharge Planning: Goal: Ability to manage health-related needs will improve 04/22/2018 2338 by Myles Gip, RN Outcome: Progressing 04/22/2018 2338 by Myles Gip, RN Outcome: Progressing   Problem: Clinical Measurements: Goal: Diagnostic test results will improve 04/22/2018 2338 by Myles Gip, RN Outcome: Progressing 04/22/2018 2338 by Myles Gip, RN Outcome: Progressing   Problem: Pain Managment: Goal: General experience of comfort will improve 04/22/2018 2338 by Myles Gip, RN Outcome: Progressing 04/22/2018 2338 by Myles Gip, RN Outcome: Progressing

## 2018-04-22 NOTE — Progress Notes (Signed)
Tylenol effective for HA. Denies other pain. Afebrile thus far. IVF"s and IV antibiotic-rocephin. Eating well. Family at bedside with pt requesting they interpret for her. Declined interpreter.

## 2018-04-22 NOTE — Progress Notes (Signed)
Was utilized interpreter for pt. Interpreter name was Onalee Hua # [id number] and was use to informed pt about plan for tonight and all her meds/assessment. Pt was also asked if she have any concerns and question but pt states " none as of this time". Will continue to monitor.

## 2018-04-23 LAB — CBC
HCT: 29.2 % — ABNORMAL LOW (ref 35.0–47.0)
HEMOGLOBIN: 10.3 g/dL — AB (ref 12.0–16.0)
MCH: 29 pg (ref 26.0–34.0)
MCHC: 35.4 g/dL (ref 32.0–36.0)
MCV: 82 fL (ref 80.0–100.0)
Platelets: 179 10*3/uL (ref 150–440)
RBC: 3.56 MIL/uL — ABNORMAL LOW (ref 3.80–5.20)
RDW: 14.6 % — AB (ref 11.5–14.5)
WBC: 10.3 10*3/uL (ref 3.6–11.0)

## 2018-04-23 LAB — GLUCOSE, CAPILLARY: Glucose-Capillary: 98 mg/dL (ref 70–99)

## 2018-04-23 MED ORDER — CIPROFLOXACIN HCL 250 MG PO TABS: 250.0000 mg | ORAL_TABLET | Freq: Two times a day (BID) | ORAL | 0 refills | Status: AC

## 2018-04-23 NOTE — Progress Notes (Signed)
Oral and written AVS instruction in [place] and Spanish completed. Rx given to son for Cipro. Work note given to son. Ready for discharge home with son/husband.

## 2018-04-23 NOTE — Progress Notes (Signed)
  April 23, 2018  Patient: Nicole Villegas  Date of Birth: [birth year]  Date of Visit: 04/20/2018    To Whom It May Concern:  Nicole Villeg[facility] through 04/22/18.Nicole Villegas may return to work on Monday, May 01, 2018.   Sincerely,  Dr. Joaquim Lai, Hospitalist at [facility]

## 2018-04-23 NOTE — Discharge Summary (Signed)
[facility] [place] NAME: Bonnye Halle    MR#:  [id number]  DATE OF BIRTH:  [birth year]  DATE OF ADMISSION:  04/20/2018 ADMITTING PHYSICIAN: Hillary Bow, MD  DATE OF DISCHARGE: 04/23/2018 10:45 AM  PRIMARY CARE PHYSICIAN: Nat Christen, PA-C    ADMISSION DIAGNOSIS:  Pyelonephritis [N12] Right upper quadrant abdominal pain [R10.11] Fever, unspecified fever cause [R50.9] Multiple renal cysts [Q61.02]  DISCHARGE DIAGNOSIS:  Active Problems:   UTI (urinary tract infection)   SECONDARY DIAGNOSIS:   Past Medical History:  Diagnosis Date  . Diabetes mellitus without complication ([facility])   . Hyperlipidemia   . Hypertension     HOSPITAL COURSE:   52 year old female w/ hx of DM, HTN, Hyperlipidemia who presented to the hospital due to abdominal pain nausea and noted to have acute pyelonephritis.  1.  Sepsis- patient met criteria given her fever, leukocytosis, tachycardia. -Source of sepsis is UTI with pyelonephritis. -Patient was treated with IV ceftriaxone, IV fluids.  Her urine and blood cultures are positive for E. coli and it was sensitive to ceftriaxone.  Patient has clinically improved and is currently hemodynamically stable.  She continues to spike some low-grade fevers which is typical. - Patient will now be discharged on another 10-day course of oral ciprofloxacin.  2.  Acute pyelonephritis-source of patient's sepsis. Patient was treated with IV ceftriaxone, IV fluids, antiemetics and supportive care.  Her blood and urine cultures are positive for E. coli and it sensitive to ceftriaxone but given her bacteremia and sepsis she is being discharged on 10 more days of oral ciprofloxacin.  3.  Diabetes type 2 without complication - pt. Will continue Levemir, novolog with meals, Glipizide and BS are stable on discharge.   4.  Essential hypertension- pt. Will continue atenolol.  5.  Diabetic neuropathy- pt. Will continue  gabapentin.  6. Hyperlipidemia - pt. Will  cont. Simvastatin.    DISCHARGE CONDITIONS:   Stable.   CONSULTS OBTAINED:  Treatment Team:  Festus Aloe, MD  DRUG ALLERGIES:  No Known Allergies  DISCHARGE MEDICATIONS:   Allergies as of 04/23/2018   No Known Allergies     Medication List    TAKE these medications   atenolol 50 MG tablet Commonly known as:  TENORMIN Take 50 mg by mouth daily.   ciprofloxacin 250 MG tablet Commonly known as:  CIPRO Take 1 tablet (250 mg total) by mouth 2 (two) times daily for 10 days.   cyclobenzaprine 5 MG tablet Commonly known as:  FLEXERIL Take 5 mg by mouth daily as needed for muscle spasms.   fexofenadine 60 MG tablet Commonly known as:  ALLEGRA Take 60 mg by mouth 2 (two) times daily as needed for allergies.   gabapentin 300 MG capsule Commonly known as:  NEURONTIN Take 300 mg by mouth 2 (two) times daily.   GLIPIZIDE XL 10 MG 24 hr tablet Generic drug:  glipiZIDE Take 10 mg by mouth 2 (two) times daily.   insulin aspart 100 UNIT/ML injection Commonly known as:  novoLOG Inject 30 Units into the skin 3 (three) times daily before meals.   insulin detemir 100 UNIT/ML injection Commonly known as:  LEVEMIR Inject 60 Units into the skin at bedtime.   lisinopril-hydrochlorothiazide 20-12.5 MG tablet Commonly known as:  PRINZIDE,ZESTORETIC Take 2 tablets by mouth daily.   meloxicam 15 MG tablet Commonly known as:  MOBIC Take 15 mg by mouth daily.   metFORMIN 500 MG tablet Commonly known as:  GLUCOPHAGE  Take 1,000 mg by mouth 2 (two) times daily.   omeprazole 20 MG capsule Commonly known as:  PRILOSEC Take 20 mg by mouth daily.   simvastatin 40 MG tablet Commonly known as:  ZOCOR Take 40 mg by mouth at bedtime.         DISCHARGE INSTRUCTIONS:   DIET:  Cardiac diet and Diabetic diet  DISCHARGE CONDITION:  Stable  ACTIVITY:  Activity as tolerated  OXYGEN:  Home Oxygen: No.   Oxygen Delivery:  room air  DISCHARGE LOCATION:  home   If you experience worsening of your admission symptoms, develop shortness of breath, life threatening emergency, suicidal or homicidal thoughts you must seek medical attention immediately by calling 911 or calling your MD immediately  if symptoms less severe.  You Must read complete instructions/literature along with all the possible adverse reactions/side effects for all the Medicines you take and that have been prescribed to you. Take any new Medicines after you have completely understood and accpet all the possible adverse reactions/side effects.   Please note  You were cared for by a hospitalist during your hospital stay. If you have any questions about your discharge medications or the care you received while you were in the hospital after you are discharged, you can call the unit and asked to speak with the hospitalist on call if the hospitalist that took care of you is not available. Once you are discharged, your primary care physician will handle any further medical issues. Please note that NO REFILLS for any discharge medications will be authorized once you are discharged, as it is imperative that you return to your primary care physician (or establish a relationship with a primary care physician if you do not have one) for your aftercare needs so that they can reassess your need for medications and monitor your lab values.     Today   No abdominal pain, nausea, vomiting.  Headache is improved.  Still continues to have some fever but resolved with Tylenol.  Will discharge home today on oral antibiotics.  VITAL SIGNS:  Blood pressure 136/79, pulse 81, temperature 98 F (36.7 C), temperature source Oral, resp. rate 18, height '5\' 1"'  (1.549 m), weight 79 kg, SpO2 98 %.  I/O:    Intake/Output Summary (Last 24 hours) at 04/23/2018 1304 Last data filed at 04/23/2018 0900 Gross per 24 hour  Intake 2071.73 ml  Output -  Net 2071.73 ml    PHYSICAL  EXAMINATION:  GENERAL:  52 y.o.-year-old patient lying in the bed with no acute distress.  EYES: Pupils equal, round, reactive to light and accommodation. No scleral icterus. Extraocular muscles intact.  HEENT: Head atraumatic, normocephalic. Oropharynx and nasopharynx clear.  NECK:  Supple, no jugular venous distention. No thyroid enlargement, no tenderness.  LUNGS: Normal breath sounds bilaterally, no wheezing, rales,rhonchi. No use of accessory muscles of respiration.  CARDIOVASCULAR: S1, S2 normal. No murmurs, rubs, or gallops.  ABDOMEN: Soft, non-tender, non-distended. Bowel sounds present. No organomegaly or mass.  EXTREMITIES: No pedal edema, cyanosis, or clubbing.  NEUROLOGIC: Cranial nerves II through XII are intact. No focal motor or sensory defecits b/l.  PSYCHIATRIC: The patient is alert and oriented x 3. Good affect.  SKIN: No obvious rash, lesion, or ulcer.   DATA REVIEW:   CBC Recent Labs  Lab 04/23/18 0417  WBC 10.3  HGB 10.3*  HCT 29.2*  PLT 179    Chemistries  Recent Labs  Lab 04/20/18 0308  04/22/18 0407  NA 129*   < >  138  K 3.7   < > 3.5  CL 92*   < > 112*  CO2 23   < > 22  GLUCOSE 533*   < > 244*  BUN 21*   < > 20  CREATININE 0.99   < > 0.75  CALCIUM 8.8*   < > 7.7*  AST 22  --   --   ALT 17  --   --   ALKPHOS 83  --   --   BILITOT 1.8*  --   --    < > = values in this interval not displayed.    Cardiac Enzymes Recent Labs  Lab 04/20/18 0515  TROPONINI <0.03    Microbiology Results  Results for orders placed or performed during the hospital encounter of 04/20/18  Urine culture     Status: Abnormal   Collection Time: 04/20/18  3:08 AM  Result Value Ref Range Status   Specimen Description   Final    URINE, RANDOM Performed at [facility], [address], [place], [place] [address]    Special Requests   Final    NONE Performed at [facility], [address] [place], [place] [address]    Culture >=100,000  COLONIES/mL ESCHERICHIA COLI (A)  Final   Report Status 04/22/2018 FINAL  Final   Organism ID, Bacteria ESCHERICHIA COLI (A)  Final      Susceptibility   Escherichia coli - MIC*    AMPICILLIN >=32 RESISTANT Resistant     CEFAZOLIN <=4 SENSITIVE Sensitive     CEFTRIAXONE <=1 SENSITIVE Sensitive     CIPROFLOXACIN <=0.25 SENSITIVE Sensitive     GENTAMICIN <=1 SENSITIVE Sensitive     IMIPENEM <=0.25 SENSITIVE Sensitive     NITROFURANTOIN <=16 SENSITIVE Sensitive     TRIMETH/SULFA >=320 RESISTANT Resistant     AMPICILLIN/SULBACTAM >=32 RESISTANT Resistant     PIP/TAZO 8 SENSITIVE Sensitive     Extended ESBL NEGATIVE Sensitive     * >=100,000 COLONIES/mL ESCHERICHIA COLI  Culture, blood (routine x 2)     Status: Abnormal   Collection Time: 04/20/18  5:14 AM  Result Value Ref Range Status   Specimen Description   Final    BLOOD LEFT HAND Performed at [facility], [address], [place], [place] [address]    Special Requests   Final    BOTTLES DRAWN AEROBIC AND ANAEROBIC Blood Culture results may not be optimal due to an excessive volume of blood received in culture bottles Performed at [facility], [address] [place], [place] [address]    Culture  Setup Time   Final    GRAM NEGATIVE RODS IN BOTH AEROBIC AND ANAEROBIC BOTTLES CRITICAL RESULT CALLED TO, READ BACK BY AND VERIFIED WITH: JASON ROBBINS AT 8250 ON 04/20/18 [place]. Performed at [facility], [place] [address] [place],  [address]    Culture ESCHERICHIA COLI (A)  Final   Report Status 04/22/2018 FINAL  Final   Organism ID, Bacteria ESCHERICHIA COLI  Final      Susceptibility   Escherichia coli - MIC*    AMPICILLIN >=32 RESISTANT Resistant     CEFAZOLIN <=4 SENSITIVE Sensitive     CEFEPIME <=1 SENSITIVE Sensitive     CEFTAZIDIME <=1 SENSITIVE Sensitive     CEFTRIAXONE <=1 SENSITIVE Sensitive     CIPROFLOXACIN <=0.25 SENSITIVE Sensitive     GENTAMICIN <=1 SENSITIVE Sensitive     IMIPENEM  <=0.25 SENSITIVE Sensitive  TRIMETH/SULFA >=320 RESISTANT Resistant     AMPICILLIN/SULBACTAM >=32 RESISTANT Resistant     PIP/TAZO 8 SENSITIVE Sensitive     Extended ESBL NEGATIVE Sensitive     * ESCHERICHIA COLI  Blood Culture ID Panel (Reflexed)     Status: Abnormal   Collection Time: 04/20/18  5:14 AM  Result Value Ref Range Status   Enterococcus species NOT DETECTED NOT DETECTED Final   Listeria monocytogenes NOT DETECTED NOT DETECTED Final   Staphylococcus species NOT DETECTED NOT DETECTED Final   Staphylococcus aureus (BCID) NOT DETECTED NOT DETECTED Final   Streptococcus species NOT DETECTED NOT DETECTED Final   Streptococcus agalactiae NOT DETECTED NOT DETECTED Final   Streptococcus pneumoniae NOT DETECTED NOT DETECTED Final   Streptococcus pyogenes NOT DETECTED NOT DETECTED Final   Acinetobacter baumannii NOT DETECTED NOT DETECTED Final   Enterobacteriaceae species DETECTED (A) NOT DETECTED Final    Comment: Enterobacteriaceae represent a large family of gram-negative bacteria, not a single organism. CRITICAL RESULT CALLED TO, READ BACK BY AND VERIFIED WITH: JASON ROBBINS AT 1660 ON 04/20/18 [place].    Enterobacter cloacae complex NOT DETECTED NOT DETECTED Final   Escherichia coli DETECTED (A) NOT DETECTED Final    Comment: CRITICAL RESULT CALLED TO, READ BACK BY AND VERIFIED WITH: JASON ROBBINS AT 6301 ON 04/20/18 [place].    Klebsiella oxytoca NOT DETECTED NOT DETECTED Final   Klebsiella pneumoniae NOT DETECTED NOT DETECTED Final   Proteus species NOT DETECTED NOT DETECTED Final   Serratia marcescens NOT DETECTED NOT DETECTED Final   Carbapenem resistance NOT DETECTED NOT DETECTED Final   Haemophilus influenzae NOT DETECTED NOT DETECTED Final   Neisseria meningitidis NOT DETECTED NOT DETECTED Final   Pseudomonas aeruginosa NOT DETECTED NOT DETECTED Final   Candida albicans NOT DETECTED NOT DETECTED Final   Candida glabrata NOT DETECTED NOT DETECTED Final   Candida krusei  NOT DETECTED NOT DETECTED Final   Candida parapsilosis NOT DETECTED NOT DETECTED Final   Candida tropicalis NOT DETECTED NOT DETECTED Final    Comment: Performed at [facility], [address] [place], [place] [address]  Culture, blood (routine x 2)     Status: Abnormal   Collection Time: 04/20/18  5:15 AM  Result Value Ref Range Status   Specimen Description   Final    BLOOD RIGHT WRIST Performed at [facility], [address] [place], [place] [address]    Special Requests   Final    BOTTLES DRAWN AEROBIC AND ANAEROBIC Blood Culture results may not be optimal due to an excessive volume of blood received in culture bottles Performed at [facility], [address] [place], [place] [address]    Culture  Setup Time   Final    GRAM NEGATIVE RODS IN BOTH AEROBIC AND ANAEROBIC BOTTLES CRITICAL VALUE NOTED.  VALUE IS CONSISTENT WITH PREVIOUSLY REPORTED AND CALLED VALUE. Performed at [facility], [place] [place], [place] [address]    Culture (A)  Final    ESCHERICHIA COLI SUSCEPTIBILITIES PERFORMED ON PREVIOUS CULTURE WITHIN THE LAST 5 DAYS. Performed at [facility], [place] [address], [place],  [address]    Report Status 04/22/2018 FINAL  Final    RADIOLOGY:  No results found.    Management plans discussed with the patient, family and they are in agreement.  CODE STATUS:     Code Status Orders  (From admission, onward)         Start     Ordered  04/20/18 1132  Full code  Continuous     04/20/18 1133        Code Status History    This patient has a current code status but no historical code status.      TOTAL TIME TAKING CARE OF THIS PATIENT: 40 minutes.    Henreitta Leber M.D on 04/23/2018 at 1:04 PM  Between 7am to 6pm - Pager - [phone]  After 6pm go to [email or web address] - [employer]  Hospitalists  Office  [phone]  CC: Primary care physician; Nat Christen, PA-C

## 2018-04-24 ENCOUNTER — Telehealth: Payer: Self-pay | Admitting: Urology

## 2018-04-24 NOTE — Telephone Encounter (Signed)
App has been made and patient is aware   ° °Nicole Villegas  °

## 2018-05-05 ENCOUNTER — Ambulatory Visit (INDEPENDENT_AMBULATORY_CARE_PROVIDER_SITE_OTHER): Payer: 59 | Admitting: Urology

## 2018-05-05 ENCOUNTER — Encounter: Payer: Self-pay | Admitting: Urology

## 2018-05-05 VITALS — BP 120/79 | HR 76 | Ht 61.0 in | Wt 162.6 lb

## 2018-05-05 DIAGNOSIS — N12 Tubulo-interstitial nephritis, not specified as acute or chronic: Secondary | ICD-10-CM

## 2018-05-05 LAB — MICROSCOPIC EXAMINATION: RBC, UA: NONE SEEN /hpf (ref 0–2)

## 2018-05-05 LAB — URINALYSIS, COMPLETE
BILIRUBIN UA: NEGATIVE
Glucose, UA: NEGATIVE
KETONES UA: NEGATIVE
Leukocytes, UA: NEGATIVE
Nitrite, UA: NEGATIVE
PROTEIN UA: NEGATIVE
RBC, UA: NEGATIVE
Specific Gravity, UA: 1.02 (ref 1.005–1.030)
UUROB: 0.2 mg/dL (ref 0.2–1.0)
pH, UA: 5 (ref 5.0–7.5)

## 2018-05-05 LAB — BLADDER SCAN AMB NON-IMAGING

## 2018-05-05 MED ORDER — CEFUROXIME AXETIL 500 MG PO TABS: 500.0000 mg | ORAL_TABLET | Freq: Two times a day (BID) | ORAL | 0 refills | Status: AC

## 2018-05-05 NOTE — Progress Notes (Signed)
05/05/2018 1:24 PM   Oswaldo Milian 02/06/1966 [id number]  Referring provider: Verlee Monte, PA-C [address] [place], [place] [address]  Chief Complaint  Patient presents with  . Hospitalization Follow-up    HPI: 52 year old female presents for hospital follow-up.  She was admitted on 04/20/2018 with pyelonephritis/sepsis.  Urine and blood cultures were positive for E. coli.  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  There was questionable renal mass however an MRI was performed and findings were consistent with pyelonephritis.  She was discharged on 04/23/2018 on a 10-day course of ciprofloxacin.  Overall she is feeling better.  She is still having some low-grade fevers at nighttime.  She does have a history of recurrent lower tract UTIs.   PMH: Past Medical History:  Diagnosis Date  . Diabetes mellitus without complication ([facility])   . Hyperlipidemia   . Hypertension     Surgical History: Past Surgical History:  Procedure Laterality Date  . CESAREAN SECTION     x3    Home Medications:  Allergies as of 05/05/2018   No Known Allergies     Medication List        Accurate as of 05/05/18  1:24 PM. Always use your most recent med list.          atenolol 50 MG tablet Commonly known as:  TENORMIN Take 50 mg by mouth daily.   ciprofloxacin 250 MG tablet Commonly known as:  CIPRO Take 250 mg by mouth 2 (two) times daily.   cyclobenzaprine 5 MG tablet Commonly known as:  FLEXERIL Take 5 mg by mouth daily as needed for muscle spasms.   fexofenadine 60 MG tablet Commonly known as:  ALLEGRA Take 60 mg by mouth 2 (two) times daily as needed for allergies.   gabapentin 300 MG capsule Commonly known as:  NEURONTIN Take 300 mg by mouth 2 (two) times daily.   GLIPIZIDE XL 10 MG 24 hr tablet Generic drug:  glipiZIDE Take 10 mg by mouth 2 (two) times daily.   insulin aspart 100 UNIT/ML injection Commonly known as:  novoLOG Inject  30 Units into the skin 3 (three) times daily before meals.   insulin detemir 100 UNIT/ML injection Commonly known as:  LEVEMIR Inject 60 Units into the skin at bedtime.   lisinopril-hydrochlorothiazide 20-12.5 MG tablet Commonly known as:  PRINZIDE,ZESTORETIC Take 2 tablets by mouth daily.   meloxicam 15 MG tablet Commonly known as:  MOBIC Take 15 mg by mouth daily.   metFORMIN 500 MG tablet Commonly known as:  GLUCOPHAGE Take 1,000 mg by mouth 2 (two) times daily.   omeprazole 20 MG capsule Commonly known as:  PRILOSEC Take 20 mg by mouth daily.   simvastatin 40 MG tablet Commonly known as:  ZOCOR Take 40 mg by mouth at bedtime.       Allergies: No Known Allergies  Family History: No family history on file.  Social History:  reports that she has never smoked. She has never used smokeless tobacco. She reports that she drank alcohol. She reports that she has current or past drug history.  ROS: UROLOGY Frequent Urination?: No Hard to postpone urination?: No Burning/pain with urination?: No Get up at night to urinate?: No Leakage of urine?: No Urine stream starts and stops?: No Trouble starting stream?: No Do you have to strain to urinate?: No Blood in urine?: No Urinary tract infection?: No Sexually transmitted disease?: No Injury to kidneys or bladder?: No Painful intercourse?: No  Weak stream?: No Currently pregnant?: No Vaginal bleeding?: No Last menstrual period?: Postmenopausal  Gastrointestinal Nausea?: No Vomiting?: No Indigestion/heartburn?: No Diarrhea?: No Constipation?: No  Constitutional Fever: Yes Night sweats?: Yes Weight loss?: No Fatigue?: Yes  Skin Skin rash/lesions?: No Itching?: No  Eyes Blurred vision?: Yes Double vision?: No  Ears/Nose/Throat Sore throat?: No Sinus problems?: No  Hematologic/Lymphatic Swollen glands?: No Easy bruising?: No  Cardiovascular Leg swelling?: No Chest pain?: No  Respiratory Cough?:  No Shortness of breath?: No  Endocrine Excessive thirst?: No  Musculoskeletal Back pain?: Yes Joint pain?: No  Neurological Headaches?: Yes Dizziness?: Yes  Psychologic Depression?: No Anxiety?: No  Physical Exam: BP 120/79 (BP Location: Left Arm, Patient Position: Sitting, Cuff Size: Large)   Pulse 76   Ht 5\' 1"  (1.549 m)   Wt 162 lb 9.6 oz (73.8 kg)   BMI 30.72 kg/m   Constitutional:  Alert and oriented, No acute distress. HEENT: [place] AT, moist mucus membranes.  Trachea midline, no masses. Cardiovascular: No clubbing, cyanosis, or edema. Respiratory: Normal respiratory effort, no increased work of breathing. GI: Abdomen is soft, nontender, nondistended, no abdominal masses GU: No CVA tenderness Lymph: No cervical or inguinal lymphadenopathy. Skin: No rashes, bruises or suspicious lesions. Neurologic: Grossly intact, no focal deficits, moving all 4 extremities. Psychiatric: Normal mood and affect.  Laboratory Data:  Urinalysis Dipstick/microscopy negative  Assessment & Plan:   52 year old female with recent episode of pyelonephritis/E. coli bacteremia.  Urinalysis today was normal.  PVR by bladder scan was 0 mL.  Since she is still having low-grade fever will send an additional 5 days of antibiotics.  Follow-up 3 months and to call earlier for recurrent symptoms.  Riki Altes, MD  [facility] [address], [address] [place], [place] [address] [phone]

## 2018-08-10 ENCOUNTER — Encounter: Payer: Self-pay | Admitting: Urology

## 2018-08-10 ENCOUNTER — Ambulatory Visit: Payer: 59 | Admitting: Urology

## 2018-08-10 NOTE — Progress Notes (Incomplete)
08/10/2018 7:51 AM   Nicole Villegas 19-Jan-1966 [id number]  Referring provider: Verlee Monte, PA-C [address] [place], [place] [address]  No chief complaint on file.   HPI: Nicole Villegas is a 53 yo F with a recent episode of pyelonephrities/E.coli bacteremia returns today for a 3 month recheck.   Previous history:  She was admitted on 04/20/2018 at the hospital with pyelonephritis/sepsis. Her urine and blood cultures at the time was positive for E.coli. 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  There was questionable renal mass however an MRI was performed and findings were consistent with pyelonephritis. She was discharged on 04/23/2018 on a 10-day course of  ciprofloxacin. She does have a history of recurrent lower tract UTIs.   PMH: Past Medical History:  Diagnosis Date   Diabetes mellitus without complication ([facility])    Hyperlipidemia    Hypertension     Surgical History: Past Surgical History:  Procedure Laterality Date   CESAREAN SECTION     x3    Home Medications:  Allergies as of 08/10/2018   No Known Allergies     Medication List       Accurate as of August 10, 2018  7:51 AM. Always use your most recent med list.        atenolol 50 MG tablet Commonly known as:  TENORMIN Take 50 mg by mouth daily.   ciprofloxacin 250 MG tablet Commonly known as:  CIPRO Take 250 mg by mouth 2 (two) times daily.   cyclobenzaprine 5 MG tablet Commonly known as:  FLEXERIL Take 5 mg by mouth daily as needed for muscle spasms.   fexofenadine 60 MG tablet Commonly known as:  ALLEGRA Take 60 mg by mouth 2 (two) times daily as needed for allergies.   gabapentin 300 MG capsule Commonly known as:  NEURONTIN Take 300 mg by mouth 2 (two) times daily.   GLIPIZIDE XL 10 MG 24 hr tablet Generic drug:  glipiZIDE Take 10 mg by mouth 2 (two) times daily.   insulin aspart 100 UNIT/ML injection Commonly known as:  novoLOG Inject 30 Units  into the skin 3 (three) times daily before meals.   insulin detemir 100 UNIT/ML injection Commonly known as:  LEVEMIR Inject 60 Units into the skin at bedtime.   lisinopril-hydrochlorothiazide 20-12.5 MG tablet Commonly known as:  PRINZIDE,ZESTORETIC Take 2 tablets by mouth daily.   meloxicam 15 MG tablet Commonly known as:  MOBIC Take 15 mg by mouth daily.   metFORMIN 500 MG tablet Commonly known as:  GLUCOPHAGE Take 1,000 mg by mouth 2 (two) times daily.   omeprazole 20 MG capsule Commonly known as:  PRILOSEC Take 20 mg by mouth daily.   simvastatin 40 MG tablet Commonly known as:  ZOCOR Take 40 mg by mouth at bedtime.       Allergies: No Known Allergies  Family History: No family history on file.  Social History:  reports that she has never smoked. She has never used smokeless tobacco. She reports previous alcohol use. She reports previous drug use.  ROS:                                        Physical Exam: There were no vitals taken for this visit.  Constitutional:  Alert and oriented, No acute distress. HEENT:  AT, moist mucus membranes.  Trachea midline, no masses. Cardiovascular:  No clubbing, cyanosis, or edema. Respiratory: Normal respiratory effort, no increased work of breathing. GI: Abdomen is soft, nontender, nondistended, no abdominal masses GU: No CVA tenderness Lymph: No cervical or inguinal lymphadenopathy. Skin: No rashes, bruises or suspicious lesions. Neurologic: Grossly intact, no focal deficits, moving all 4 extremities. Psychiatric: Normal mood and affect.  Laboratory Data:  Pertinent Imaging: ***  Assessment & Plan:    1. rUTI -recent episode of pyelonephritis/E.coli bacteremia    No follow-ups on file.  [facility] [address], [address] [place], [place] [address] [phone]  I, Donne Hazel, am acting as a Neurosurgeon for [employer],  {Add Scribe Attestation Statement}

## 2018-08-14 ENCOUNTER — Encounter: Payer: Self-pay | Admitting: Urology

## 2018-08-15 NOTE — Progress Notes (Signed)
Certified letter given for translation on 08/15/2018.

## 2018-08-18 ENCOUNTER — Encounter: Payer: Self-pay | Admitting: Urology

## 2018-09-15 ENCOUNTER — Telehealth: Payer: Self-pay | Admitting: Urology

## 2018-11-09 NOTE — Telephone Encounter (Signed)
error 

## 2018-11-28 ENCOUNTER — Other Ambulatory Visit: Payer: Self-pay | Admitting: Physician Assistant

## 2018-11-28 DIAGNOSIS — R42 Dizziness and giddiness: Secondary | ICD-10-CM

## 2018-12-06 ENCOUNTER — Ambulatory Visit
Admission: RE | Admit: 2018-12-06 | Discharge: 2018-12-06 | Disposition: A | Discharge status: Home / Self Care | Payer: 59 | Source: Ambulatory Visit | Attending: Physician Assistant | Admitting: Physician Assistant

## 2018-12-06 ENCOUNTER — Other Ambulatory Visit: Payer: Self-pay

## 2018-12-06 DIAGNOSIS — R42 Dizziness and giddiness: Secondary | ICD-10-CM | POA: Diagnosis not present

## 2018-12-07 ENCOUNTER — Ambulatory Visit: Admission: RE | Admit: 2018-12-07 | Payer: 59 | Source: Ambulatory Visit

## 2020-01-30 ENCOUNTER — Emergency Department: Payer: BLUE CROSS/BLUE SHIELD

## 2020-01-30 ENCOUNTER — Observation Stay
Admission: EM | Admit: 2020-01-30 | Discharge: 2020-01-31 | Disposition: A | Discharge status: Home / Self Care | Payer: BLUE CROSS/BLUE SHIELD | Attending: Internal Medicine | Admitting: Internal Medicine

## 2020-01-30 ENCOUNTER — Other Ambulatory Visit: Payer: Self-pay

## 2020-01-30 DIAGNOSIS — R778 Other specified abnormalities of plasma proteins: Secondary | ICD-10-CM

## 2020-01-30 DIAGNOSIS — I10 Essential (primary) hypertension: Secondary | ICD-10-CM | POA: Diagnosis not present

## 2020-01-30 DIAGNOSIS — E119 Type 2 diabetes mellitus without complications: Secondary | ICD-10-CM | POA: Insufficient documentation

## 2020-01-30 DIAGNOSIS — Z794 Long term (current) use of insulin: Secondary | ICD-10-CM | POA: Insufficient documentation

## 2020-01-30 DIAGNOSIS — R0789 Other chest pain: Principal | ICD-10-CM | POA: Insufficient documentation

## 2020-01-30 DIAGNOSIS — Z20822 Contact with and (suspected) exposure to covid-19: Secondary | ICD-10-CM | POA: Diagnosis not present

## 2020-01-30 DIAGNOSIS — Z79899 Other long term (current) drug therapy: Secondary | ICD-10-CM | POA: Insufficient documentation

## 2020-01-30 DIAGNOSIS — R079 Chest pain, unspecified: Secondary | ICD-10-CM | POA: Diagnosis present

## 2020-01-30 DIAGNOSIS — R7989 Other specified abnormal findings of blood chemistry: Secondary | ICD-10-CM | POA: Insufficient documentation

## 2020-01-30 LAB — BASIC METABOLIC PANEL
Anion gap: 9 (ref 5–15)
BUN: 38 mg/dL — ABNORMAL HIGH (ref 6–20)
CO2: 24 mmol/L (ref 22–32)
Calcium: 9.2 mg/dL (ref 8.9–10.3)
Chloride: 103 mmol/L (ref 98–111)
Creatinine, Ser: 1.74 mg/dL — ABNORMAL HIGH (ref 0.44–1.00)
GFR calc Af Amer: 38 mL/min — ABNORMAL LOW (ref >60)
GFR calc non Af Amer: 33 mL/min — ABNORMAL LOW (ref >60)
Glucose, Bld: 126 mg/dL — ABNORMAL HIGH (ref 70–99)
Potassium: 4.8 mmol/L (ref 3.5–5.1)
Sodium: 136 mmol/L (ref 135–145)

## 2020-01-30 LAB — CREATININE, SERUM
Creatinine, Ser: 1.72 mg/dL — ABNORMAL HIGH (ref 0.44–1.00)
GFR calc Af Amer: 38 mL/min — ABNORMAL LOW (ref >60)
GFR calc non Af Amer: 33 mL/min — ABNORMAL LOW (ref >60)

## 2020-01-30 LAB — CBC
HCT: 41.2 % (ref 36.0–46.0)
HCT: 42.2 % (ref 36.0–46.0)
Hemoglobin: 13.7 g/dL (ref 12.0–15.0)
Hemoglobin: 14.1 g/dL (ref 12.0–15.0)
MCH: 28.3 pg (ref 26.0–34.0)
MCH: 28.5 pg (ref 26.0–34.0)
MCHC: 33.3 g/dL (ref 30.0–36.0)
MCHC: 33.4 g/dL (ref 30.0–36.0)
MCV: 85.1 fL (ref 80.0–100.0)
MCV: 85.3 fL (ref 80.0–100.0)
Platelets: 284 10*3/uL (ref 150–400)
Platelets: 275 10*3/uL (ref 150–400)
RBC: 4.84 MIL/uL (ref 3.87–5.11)
RBC: 4.95 MIL/uL (ref 3.87–5.11)
RDW: 13.1 % (ref 11.5–15.5)
RDW: 13.1 % (ref 11.5–15.5)
WBC: 9.2 10*3/uL (ref 4.0–10.5)
WBC: 9.3 10*3/uL (ref 4.0–10.5)
nRBC: 0 % (ref 0.0–0.2)
nRBC: 0 % (ref 0.0–0.2)

## 2020-01-30 LAB — T4, FREE: Free T4: 0.76 ng/dL (ref 0.61–1.12)

## 2020-01-30 LAB — BRAIN NATRIURETIC PEPTIDE: B Natriuretic Peptide: 52.4 pg/mL (ref 0.0–100.0)

## 2020-01-30 LAB — TROPONIN I (HIGH SENSITIVITY)
Troponin I (High Sensitivity): 20 ng/L — ABNORMAL HIGH (ref <18)
Troponin I (High Sensitivity): 22 ng/L — ABNORMAL HIGH (ref <18)
Troponin I (High Sensitivity): 17 ng/L (ref <18)

## 2020-01-30 LAB — TSH: TSH: 0.473 u[IU]/mL (ref 0.350–4.500)

## 2020-01-30 LAB — SARS CORONAVIRUS 2 BY RT PCR (HOSPITAL ORDER, PERFORMED IN ~~LOC~~ HOSPITAL LAB): SARS Coronavirus 2: NEGATIVE

## 2020-01-30 MED ORDER — ATENOLOL 25 MG PO TABS
50.0000 mg | ORAL_TABLET | Freq: Every day | ORAL | Status: DC
  Administered 2020-01-31: 50 mg via ORAL
  Filled 2020-01-30 (×2): qty 2

## 2020-01-30 MED ORDER — FLUTICASONE PROPIONATE 50 MCG/ACT NA SUSP
2.0000 | Freq: Every day | NASAL | Status: DC | PRN
  Filled 2020-01-30: qty 16

## 2020-01-30 MED ORDER — ASPIRIN 300 MG RE SUPP: 300.0000 mg | RECTAL | Status: AC

## 2020-01-30 MED ORDER — GABAPENTIN 100 MG PO CAPS
100.0000 mg | ORAL_CAPSULE | Freq: Two times a day (BID) | ORAL | Status: DC
  Administered 2020-01-31: 100 mg via ORAL
  Filled 2020-01-30: qty 1

## 2020-01-30 MED ORDER — ASPIRIN EC 81 MG PO TBEC
81.0000 mg | DELAYED_RELEASE_TABLET | Freq: Every day | ORAL | Status: DC
  Administered 2020-01-31: 81 mg via ORAL
  Filled 2020-01-30: qty 1

## 2020-01-30 MED ORDER — HEPARIN SODIUM (PORCINE) 5000 UNIT/ML IJ SOLN
5000.0000 [IU] | Freq: Three times a day (TID) | INTRAMUSCULAR | Status: DC
  Administered 2020-01-31 (×2): 5000 [IU] via SUBCUTANEOUS
  Filled 2020-01-30 (×2): qty 1

## 2020-01-30 MED ORDER — SODIUM CHLORIDE 0.9 % IV SOLN: INTRAVENOUS | Status: DC

## 2020-01-30 MED ORDER — LISINOPRIL-HYDROCHLOROTHIAZIDE 20-12.5 MG PO TABS: 2.0000 | ORAL_TABLET | Freq: Every day | ORAL | Status: DC

## 2020-01-30 MED ORDER — ACETAMINOPHEN 325 MG PO TABS: 650.0000 mg | ORAL_TABLET | ORAL | Status: DC | PRN

## 2020-01-30 MED ORDER — LORATADINE 10 MG PO TABS
10.0000 mg | ORAL_TABLET | Freq: Every day | ORAL | Status: DC
  Administered 2020-01-31: 10 mg via ORAL
  Filled 2020-01-30 (×2): qty 1

## 2020-01-30 MED ORDER — ATORVASTATIN CALCIUM 20 MG PO TABS
80.0000 mg | ORAL_TABLET | Freq: Every day | ORAL | Status: DC
  Filled 2020-01-30: qty 4

## 2020-01-30 MED ORDER — NITROGLYCERIN 0.4 MG SL SUBL: 0.4000 mg | SUBLINGUAL_TABLET | SUBLINGUAL | Status: DC | PRN

## 2020-01-30 MED ORDER — ASPIRIN 81 MG PO CHEW
324.0000 mg | CHEWABLE_TABLET | ORAL | Status: AC
  Administered 2020-01-31: 324 mg via ORAL
  Filled 2020-01-30: qty 4

## 2020-01-30 MED ORDER — ONDANSETRON HCL 4 MG/2ML IJ SOLN: 4.0000 mg | Freq: Four times a day (QID) | INTRAMUSCULAR | Status: DC | PRN

## 2020-01-30 NOTE — ED Provider Notes (Signed)
[facility] Emergency Department Provider Note ____________________________________________   First MD Initiated Contact with Patient 01/30/20 1910     (approximate)  I have reviewed the triage vital signs and the nursing notes.   HISTORY  Chief Complaint Chest Pain  History of present illness and review of systems obtained via in-person Spanish interpreter  HPI Nicole Villegas is a 54 y.o. female with PMH as noted below but no prior cardiac history presents with chest pain, worse with exertion especially when she is at work, described as pressure, and lasting about 10 minutes when it happens.  She states that it has been present intermittently over the last 2 weeks.  She denies any prior history of this symptom.  She has no leg pain or swelling, no palpitations.  Past Medical History:  Diagnosis Date  . Diabetes mellitus without complication (HCC)   . Hyperlipidemia   . Hypertension     Patient Active Problem List   Diagnosis Date Noted  . UTI (urinary tract infection) 04/20/2018    Past Surgical History:  Procedure Laterality Date  . CESAREAN SECTION     x3    Prior to Admission medications   Medication Sig Start Date End Date Taking? Authorizing Provider  atenolol (TENORMIN) 50 MG tablet Take 50 mg by mouth daily.   Yes [provider]  cyclobenzaprine (FLEXERIL) 5 MG tablet Take 5 mg by mouth daily as needed for muscle spasms.    Yes [provider]  gabapentin (NEURONTIN) 300 MG capsule Take 300 mg by mouth 2 (two) times daily.   Yes [provider]  fexofenadine (ALLEGRA) 60 MG tablet Take 60 mg by mouth 2 (two) times daily as needed for allergies.     [provider]  GLIPIZIDE XL 10 MG 24 hr tablet Take 10 mg by mouth 2 (two) times daily.    [provider]  insulin aspart (NOVOLOG) 100 UNIT/ML injection Inject 30 Units into the skin 3 (three) times daily before meals.    [provider]    insulin detemir (LEVEMIR) 100 UNIT/ML injection Inject 60 Units into the skin at bedtime.    [provider]  lisinopril-hydrochlorothiazide (PRINZIDE,ZESTORETIC) 20-12.5 MG tablet Take 2 tablets by mouth daily.    [provider]  meloxicam (MOBIC) 15 MG tablet Take 15 mg by mouth daily.    [provider]  metFORMIN (GLUCOPHAGE) 500 MG tablet Take 1,000 mg by mouth 2 (two) times daily.    [provider]  omeprazole (PRILOSEC) 20 MG capsule Take 20 mg by mouth daily.    [provider]  simvastatin (ZOCOR) 40 MG tablet Take 40 mg by mouth at bedtime.    [provider]    Allergies Patient has no known allergies.  No family history on file.  Social History Social History   Tobacco Use  . Smoking status: Never Smoker  . Smokeless tobacco: Never Used  Vaping Use  . Vaping Use: Never used  Substance Use Topics  . Alcohol use: Not Currently  . Drug use: Not Currently    Review of Systems  Constitutional: No fever/chills. Eyes: No visual changes. ENT: No sore throat. Cardiovascular: Positive for intermittent chest pain. Respiratory: Denies shortness of breath. Gastrointestinal: No vomiting or diarrhea.  Genitourinary: Negative for dysuria.  Musculoskeletal: Negative for back pain. Skin: Negative for rash. Neurological: Negative for headache.   ____________________________________________   PHYSICAL EXAM:  VITAL SIGNS: ED Triage Vitals  Enc Vitals Group  BP 01/30/20 1451 113/66     Pulse Rate 01/30/20 1451 69     Resp 01/30/20 1451 16     Temp 01/30/20 1451 98.3 F (36.8 C)     Temp Source 01/30/20 1451 Oral     SpO2 01/30/20 1451 100 %     Weight 01/30/20 1451 162 lb (73.5 kg)     Height 01/30/20 1451 5\' 1"  (1.549 m)     Head Circumference --      Peak Flow --      Pain Score 01/30/20 1503 8     Pain Loc --      Pain Edu? --      Excl. in GC? --     Constitutional: Alert and oriented. Well  appearing and in no acute distress. Eyes: Conjunctivae are normal.  Head: Atraumatic. Nose: No congestion/rhinnorhea. Mouth/Throat: Mucous membranes are moist.   Neck: Normal range of motion.  Cardiovascular: Normal rate, regular rhythm. Grossly normal heart sounds.  Good peripheral circulation. Respiratory: Normal respiratory effort.  No retractions. Lungs CTAB. Gastrointestinal: No distention.  Musculoskeletal: No lower extremity edema.  Extremities warm and well perfused.  Neurologic:  Normal speech and language. No gross focal neurologic deficits are appreciated.  Skin:  Skin is warm and dry. No rash noted. Psychiatric: Mood and affect are normal. Speech and behavior are normal.  ____________________________________________   LABS (all labs ordered are listed, but only abnormal results are displayed)  Labs Reviewed  BASIC METABOLIC PANEL - Abnormal; Notable for the following components:      Result Value   Glucose, Bld 126 (*)    BUN 38 (*)    Creatinine, Ser 1.74 (*)    GFR calc non Af Amer 33 (*)    GFR calc Af Amer 38 (*)    All other components within normal limits  TROPONIN I (HIGH SENSITIVITY) - Abnormal; Notable for the following components:   Troponin I (High Sensitivity) 20 (*)    All other components within normal limits  TROPONIN I (HIGH SENSITIVITY) - Abnormal; Notable for the following components:   Troponin I (High Sensitivity) 22 (*)    All other components within normal limits  SARS CORONAVIRUS 2 BY RT PCR (HOSPITAL ORDER, PERFORMED IN [facility])  CBC   ____________________________________________  EKG  ED ECG REPORT I, 02/01/20, the attending physician, personally viewed and interpreted this ECG.  Date: 01/30/2020 EKG Time: 1452 Rate: 68 Rhythm: normal sinus rhythm QRS Axis: normal Intervals: normal ST/T Wave abnormalities: normal Narrative Interpretation: no evidence of acute  ischemia  ____________________________________________  RADIOLOGY  CXR: No focal infiltrate or edema  ____________________________________________   PROCEDURES  Procedure(s) performed: No  Procedures  Critical Care performed: No ____________________________________________   INITIAL IMPRESSION / ASSESSMENT AND PLAN / ED COURSE  Pertinent labs & imaging results that were available during my care of the patient were reviewed by me and considered in my medical decision making (see chart for details).  54 year old female with PMH as noted above but no prior cardiac history presents with exertional but otherwise somewhat atypical chest pain over the last 2 weeks, which is not present currently.  On exam, the patient is overall well-appearing.  Her vital signs are normal except for mild hypertension.  The physical exam is otherwise unremarkable.  EKG shows a possible old Q waves and poor R wave progression, but this may be due to lead placement.  There are no acute ischemic findings.  Initial lab work-up  reveals a troponin of 20.  I will discuss the case with cardiology and obtain a repeat.  ----------------------------------------- 8:48 PM on 01/30/2020 -----------------------------------------  Repeat troponin is 22.  Although this is not a marked rise, the patient has no significant kidney disease or other explanation for an elevated baseline troponin.  This is somewhat concerning for subacute ACS.  I discussed the case with Dr. Elease Hashimoto from cardiology who recommends admission for further observation and possible additional cardiac work-up in the morning.  The patient agrees with this plan.  I discussed the case with Dr. Allena Katz from the hospitalist service.  ____________________________________________   FINAL CLINICAL IMPRESSION(S) / ED DIAGNOSES  Final diagnoses:  Chest pain, unspecified type  Elevated troponin      NEW MEDICATIONS STARTED DURING THIS VISIT:  New  Prescriptions   No medications on file     Note:  This document was prepared using Dragon voice recognition software and may include unintentional dictation errors.   Dionne Bucy, MD 01/30/20 2050

## 2020-01-30 NOTE — ED Triage Notes (Signed)
Interpreter used for triage. Left sided chest pain X 2 weeks, sent to Ed. Radiates to right side of chest and right shoulder. Pt alert and oriented X4, cooperative, RR even and unlabored, color WNL. Pt in NAD.

## 2020-01-30 NOTE — ED Notes (Signed)
Admitting MD at bedside.

## 2020-01-30 NOTE — Progress Notes (Addendum)
Patient ID: Nicole Villegas, female   DOB: 1965-12-21, 54 y.o.   MRN: [id number]  History and Physical    Nicole Villegas QPR:[id number] DOB: 1966/04/25 DOA: 01/30/2020   PCP: Nicole Fossa, PA-C  Patient coming from: Home  Chief Complaint:  Chest pain  HPI: Nicole Villegas is a 54 y.o. female with medical history significant of HTN,DM II seen in ed for left sided chest pain. Pt is spanish speaking only . Spanish interpretor at bedside.  Pt is on jan[employer]. 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  Pt stopped and rest after her chest pain and it resolved. Last 10 minutes.  Next episode was a weeks ago and was same nature of pain and description . Left side ,rad to right , pain was pressure/ 8/10/ third episode was Monday  And affected to her neck and back. Nausea +/ No vomiting. Pt states she came in er as she was advised by her doctor today as she was feeling weak.   ED Course:  Pt is alert/awake/oriented. Blood pressure (!) 143/106, pulse 67, temperature 98.3 F (36.8 C), temperature source Oral, resp. rate 18, height 5\' 1"  (1.549 m), weight 73.5 kg, SpO2 100 %. Troponin is 20 and 22.  Pt is moderate to high risk for cardiac ischemia due to age/sex/ ad dm ii.  Review of Systems: As per HPI otherwise 10 point review of systems negative.    Past Medical History:  Diagnosis Date  . Diabetes mellitus without complication (HCC)   . Hyperlipidemia   . Hypertension     Past Surgical History:  Procedure Laterality Date  . CESAREAN SECTION     x3     reports that she has never smoked. She has never used smokeless tobacco. She reports previous alcohol use. She reports previous drug use.  No Known Allergies  History reviewed. No pertinent family history. Both parents were healthy.   Prior to Admission medications   Medication Sig Start Date End Date Taking? Authorizing Provider  atenolol (TENORMIN) 50 MG tablet Take 50 mg by mouth daily.     [provider]  ciprofloxacin (CIPRO) 250 MG tablet Take 250 mg by mouth 2 (two) times daily.    [provider]  cyclobenzaprine (FLEXERIL) 5 MG tablet Take 5 mg by mouth daily as needed for muscle spasms.     [provider]  fexofenadine (ALLEGRA) 60 MG tablet Take 60 mg by mouth 2 (two) times daily as needed for allergies.     [provider]  gabapentin (NEURONTIN) 300 MG capsule Take 300 mg by mouth 2 (two) times daily.    [provider]  GLIPIZIDE XL 10 MG 24 hr tablet Take 10 mg by mouth 2 (two) times daily.    [provider]  insulin aspart (NOVOLOG) 100 UNIT/ML injection Inject 30 Units into the skin 3 (three) times daily before meals.    [provider]  insulin detemir (LEVEMIR) 100 UNIT/ML injection Inject 60 Units into the skin at bedtime.    [provider]  lisinopril-hydrochlorothiazide (PRINZIDE,ZESTORETIC) 20-12.5 MG tablet Take 2 tablets by mouth daily.    [provider]  meloxicam (MOBIC) 15 MG tablet Take 15 mg by mouth daily.    [provider]  metFORMIN (GLUCOPHAGE) 500 MG tablet Take 1,000 mg by mouth 2 (two) times daily.    [provider]  omeprazole (PRILOSEC) 20 MG capsule Take 20 mg by mouth daily.    [provider]  simvastatin (ZOCOR) 40 MG tablet Take 40 mg by mouth at bedtime.    [provider]    Physical Exam: Vitals:   01/30/20 1803 01/30/20 1932 01/30/20 1934 01/30/20 2000  BP: 110/82 (!) 144/97  (!) 143/106  Pulse: 66  62 67  Resp: 18 14 18 18   Temp:      TempSrc:      SpO2: 98%  100% 100%  Weight:      Height:        Constitutional: NAD, calm, comfortable Vitals:   01/30/20 1803 01/30/20 1932 01/30/20 1934 01/30/20 2000  BP: 110/82 (!) 144/97  (!) 143/106  Pulse: 66  62 67  Resp: 18 14 18 18   Temp:      TempSrc:      SpO2: 98%  100% 100%  Weight:      Height:       Eyes: PERRL, lids and conjunctivae normal ENMT:  Mucous membranes are moist. Posterior pharynx clear of any exudate or lesions.Normal dentition.  Neck: normal, supple, no masses, no thyromegaly Respiratory: clear to auscultation bilaterally, no wheezing, no crackles. Normal respiratory effort. No accessory muscle use.  Cardiovascular: Regular rate and rhythm, no murmurs / rubs / gallops. No extremity edema. 2+ pedal pulses. No carotid bruits.  Abdomen: no tenderness, no masses palpated. No hepatosplenomegaly. Bowel sounds positive.  Musculoskeletal: no clubbing / cyanosis. No joint deformity upper and lower extremities. Good ROM, no contractures. Normal muscle tone.  Skin: no rashes, lesions, ulcers. No induration Neurologic: CN 2-12 grossly intact. Sensation intact, DTR normal. Strength 5/5 in all 4.  Psychiatric: Normal judgment and insight. Alert and oriented x 3. Normal mood.   Labs on Admission: I have personally reviewed following labs and imaging studies  CBC: Recent Labs  Lab 01/30/20 1503  WBC 9.2  HGB 13.7  HCT 41.2  MCV 85.1  PLT 284   Basic Metabolic Panel: Recent Labs  Lab 01/30/20 1503  NA 136  K 4.8  CL 103  CO2 24  GLUCOSE 126*  BUN 38*  CREATININE 1.74*  CALCIUM 9.2   GFR: Estimated Creatinine Clearance: 33.9 mL/min (A) (by C-G formula based on SCr of 1.74 mg/dL (H)). Urine analysis:    Component Value Date/Time   COLORURINE YELLOW (A) 04/20/2018 0308   APPEARANCEUR Clear 05/05/2018 1336   LABSPEC 1.020 04/20/2018 0308   PHURINE 6.0 04/20/2018 0308   GLUCOSEU Negative 05/05/2018 1336   HGBUR MODERATE (A) 04/20/2018 0308   BILIRUBINUR Negative 05/05/2018 1336   KETONESUR 5 (A) 04/20/2018 0308   PROTEINUR Negative 05/05/2018 1336   PROTEINUR 30 (A) 04/20/2018 0308   NITRITE Negative 05/05/2018 1336   NITRITE NEGATIVE 04/20/2018 0308   LEUKOCYTESUR Negative 05/05/2018 1336    Radiological Exams on Admission: DG Chest 2 View  Result Date: 01/30/2020 CLINICAL DATA:  Chest pain EXAM: CHEST - 2  VIEW COMPARISON:  None. FINDINGS: The heart size and mediastinal contours are within normal limits. Both lungs are clear. The visualized skeletal structures are unremarkable. IMPRESSION: No active cardiopulmonary disease. Electronically Signed   By: 05/07/2018 M.D.   On: 01/30/2020 15:33    EKG: Independently reviewed. Sinus rhythm 61/ V1 Has q waves and twi.  Assessment/Plan  Chest pain: -admit to cardiac floor. -covid-19 immunized pcr pending.  -cardiac enzymes and 2 d echo and stress test in am. -cardiac diet  And npo after midnight.   DMII: SSI/accucheck/a1c. Carb consistent diet  Glipizde held to prevent hypoglycemia  as pt is npo.  Insulin held and cover with ssi.  HTN: home regimen of lisinopril-hctz HELD.  AKI: Pt is not aware of any kidney issues.  Stop metformin. ivf and monitor level/ avoid iv contrast and dye and renally dose meds.   DVT prophylaxis: Heparin Code Status: Full. Family Communication: Spouse [phone] Raul.  Disposition Plan: Home Consults called: None Admission status: Observation.    Gertha Calkin MD [facility] If 7PM-7AM, please contact night-coverage [email or web address] Password [facility]  01/30/2020, 9:12 PM

## 2020-01-31 ENCOUNTER — Other Ambulatory Visit: Payer: BLUE CROSS/BLUE SHIELD

## 2020-01-31 ENCOUNTER — Observation Stay (HOSPITAL_BASED_OUTPATIENT_CLINIC_OR_DEPARTMENT_OTHER)
Admit: 2020-01-31 | Discharge: 2020-01-31 | Disposition: A | Discharge status: Home / Self Care | Payer: BLUE CROSS/BLUE SHIELD | Attending: Cardiovascular Disease | Admitting: Cardiovascular Disease

## 2020-01-31 ENCOUNTER — Other Ambulatory Visit: Payer: Self-pay

## 2020-01-31 DIAGNOSIS — R778 Other specified abnormalities of plasma proteins: Secondary | ICD-10-CM | POA: Diagnosis not present

## 2020-01-31 DIAGNOSIS — R079 Chest pain, unspecified: Secondary | ICD-10-CM | POA: Diagnosis not present

## 2020-01-31 DIAGNOSIS — I361 Nonrheumatic tricuspid (valve) insufficiency: Secondary | ICD-10-CM

## 2020-01-31 DIAGNOSIS — E1165 Type 2 diabetes mellitus with hyperglycemia: Secondary | ICD-10-CM

## 2020-01-31 DIAGNOSIS — E782 Mixed hyperlipidemia: Secondary | ICD-10-CM

## 2020-01-31 DIAGNOSIS — I10 Essential (primary) hypertension: Secondary | ICD-10-CM

## 2020-01-31 DIAGNOSIS — F419 Anxiety disorder, unspecified: Secondary | ICD-10-CM

## 2020-01-31 LAB — LIPID PANEL
Cholesterol: 191 mg/dL (ref 0–200)
HDL: 31 mg/dL — ABNORMAL LOW (ref >40)
LDL Cholesterol: UNDETERMINED mg/dL (ref 0–99)
Total CHOL/HDL Ratio: 6.2 RATIO
Triglycerides: 525 mg/dL — ABNORMAL HIGH (ref <150)
VLDL: UNDETERMINED mg/dL (ref 0–40)

## 2020-01-31 LAB — ECHOCARDIOGRAM COMPLETE
AR max vel: 1.72 cm2
AV Area VTI: 1.88 cm2
AV Area mean vel: 1.95 cm2
AV Mean grad: 4 mmHg
AV Peak grad: 9.5 mmHg
Ao pk vel: 1.54 m/s
Area-P 1/2: 3.91 cm2
Calc EF: 53.6 %
Height: 61 in
S' Lateral: 2.58 cm
Single Plane A2C EF: 53.3 %
Single Plane A4C EF: 53.5 %
Weight: 2592 oz

## 2020-01-31 LAB — HEMOGLOBIN A1C
Hgb A1c MFr Bld: 12.8 % — ABNORMAL HIGH (ref 4.8–5.6)
Mean Plasma Glucose: 320.66 mg/dL

## 2020-01-31 LAB — GLUCOSE, CAPILLARY
Glucose-Capillary: 312 mg/dL — ABNORMAL HIGH (ref 70–99)
Glucose-Capillary: 260 mg/dL — ABNORMAL HIGH (ref 70–99)
Glucose-Capillary: 228 mg/dL — ABNORMAL HIGH (ref 70–99)

## 2020-01-31 LAB — HIV ANTIBODY (ROUTINE TESTING W REFLEX): HIV Screen 4th Generation wRfx: NONREACTIVE

## 2020-01-31 LAB — LDL CHOLESTEROL, DIRECT: Direct LDL: 83.6 mg/dL (ref 0–99)

## 2020-01-31 LAB — TROPONIN I (HIGH SENSITIVITY): Troponin I (High Sensitivity): 15 ng/L (ref <18)

## 2020-01-31 MED ORDER — ATORVASTATIN CALCIUM 80 MG PO TABS: 80.0000 mg | ORAL_TABLET | Freq: Every day | ORAL | 1 refills | Status: AC

## 2020-01-31 MED ORDER — ASPIRIN 81 MG PO TBEC: 81.0000 mg | DELAYED_RELEASE_TABLET | Freq: Every day | ORAL | 11 refills | Status: DC

## 2020-01-31 MED ORDER — INSULIN ASPART 100 UNIT/ML ~~LOC~~ SOLN
0.0000 [IU] | SUBCUTANEOUS | Status: DC
  Administered 2020-01-31: 5 [IU] via SUBCUTANEOUS
  Administered 2020-01-31: 8 [IU] via SUBCUTANEOUS
  Filled 2020-01-31 (×2): qty 1

## 2020-01-31 MED ORDER — PANTOPRAZOLE SODIUM 40 MG PO TBEC
40.0000 mg | DELAYED_RELEASE_TABLET | Freq: Every day | ORAL | 1 refills | Status: DC
Start: 2020-01-31 — End: 2023-10-03

## 2020-01-31 NOTE — Progress Notes (Signed)
*  PRELIMINARY RESULTS* Echocardiogram 2D Echocardiogram has been performed.  Nicole Villegas 01/31/2020, 9:23 AM

## 2020-01-31 NOTE — ED Notes (Signed)
Pt discharged from ED- AVS printed and gone over with patient and husband. All questions and concerns addressed. Pt educated on 3 new prescriptions and how to take them.

## 2020-01-31 NOTE — Discharge Summary (Signed)
Physician Discharge Summary  Nicole Villegas DJS:[id number] DOB: 29-Oct-1965 DOA: 01/30/2020  PCP: Marya Fossa, PA-C  Admit date: 01/30/2020 Discharge date: 01/31/2020  Admitted From: Home  Disposition: Home   Recommendations for Outpatient Follow-up:  1. Follow up with PCP in 1-2 weeks 2. Please obtain BMP/CBC in one week 3.         Please follow up cardiology for outpatient stress evaluation.  4. Please stop  Janumet as your creatinine is around 1.7.   Discharge Condition: stable.  CODE STATUS: Full code.  Diet recommendation: Heart Healthy / Carb Modified  Brief/Interim Summary:  Nicole Villegas is a 54 y.o. female with medical history significant of HTN,DM II seen in ED for left sided chest pain. Pt is spanish speaking only . Spanish interpretor at bedside. Chest pain occurred while making packages. Currently resolved. ACS ruled out. Cardiology consulted for possible stress evaluation.  Cardiology recommended outpatient follow up for a stress test.  Pt has multiple co morbidities, with uncontrolled DM, hypertension and stage 3 a CKD.  Discussed with the patient and her husband at bedside, who requested to be discharged to follow up with PCP and cardiology as outpatient.   Discharge Diagnoses:  Active Problems:   Chest pain   Chest pain with atypical and typical features.  Resolved.  ACS is ruled out.  EKG does not show any ischemic changes. CXR is negative.  troponin's flat to negative.  Cardiology consulted for stress test as she is high risk for cardiovascular complications due to her uncontrolled DM, hypertension, , age .  Recommend outpatient follow up for stress test.    Uncontrolled DM;  Suspect non compliance to medications.  Currently on insulin, glipizide and Janumet.  Recommended to stop the Janumet and follow up with lantus, and glipizide.  A1c is 12.5.    Hypertension:  BP parameters are sub optimal.    AKI:  ? Dehydration. Gently hydrated.  Repeat renal  parameters in one week.  Hold Janumet.     Hyperlipidemia:  Started her on LIpitor 80 mg daily.         Discharge Instructions  Discharge Instructions    Diet - low sodium heart healthy   Complete by: As directed    Discharge instructions   Complete by: As directed    Please follow up with PCP in 2 weeks.     Allergies as of 01/31/2020   No Known Allergies     Medication List    STOP taking these medications   ibuprofen 200 MG tablet Commonly known as: ADVIL   omeprazole 20 MG capsule Commonly known as: PRILOSEC   simvastatin 40 MG tablet Commonly known as: ZOCOR     TAKE these medications   aspirin 81 MG EC tablet Take 1 tablet (81 mg total) by mouth daily. Swallow whole. Start taking on: February 01, 2020   atenolol 50 MG tablet Commonly known as: TENORMIN Take 50 mg by mouth daily.   atorvastatin 80 MG tablet Commonly known as: LIPITOR Take 1 tablet (80 mg total) by mouth daily.   cyclobenzaprine 5 MG tablet Commonly known as: FLEXERIL Take 5 mg by mouth at bedtime.   fexofenadine 60 MG tablet Commonly known as: ALLEGRA Take 60 mg by mouth 2 (two) times daily as needed for allergies.   fluticasone 50 MCG/ACT nasal spray Commonly known as: FLONASE Place 2 sprays into both nostrils daily as needed for allergies.   gabapentin 300 MG capsule Commonly known as: NEURONTIN Take 300 mg  by mouth 2 (two) times daily.   glipiZIDE XL 10 MG 24 hr tablet Generic drug: glipiZIDE Take 10 mg by mouth 2 (two) times daily.   insulin aspart 100 UNIT/ML FlexPen Commonly known as: NOVOLOG Inject 39 Units into the skin See admin instructions. Inject 39 units twice a day before lunch and dinner.   insulin detemir 100 UNIT/ML injection Commonly known as: LEVEMIR Inject 50 Units into the skin at bedtime.   lisinopril-hydrochlorothiazide 20-12.5 MG tablet Commonly known as: ZESTORETIC Take 2 tablets by mouth daily.   pantoprazole 40 MG tablet Commonly known  as: Protonix Take 1 tablet (40 mg total) by mouth daily.         Follow-up Information    Marya Fossa, PA-C. Schedule an appointment as soon as possible for a visit in 2 week(s).   Specialty: Physician Assistant Contact information: [address] [place] [address] [phone]              No Known Allergies  Consultations:  Cardiology.    Procedures/Studies: DG Chest 2 View  Result Date: 01/30/2020 CLINICAL DATA:  Chest pain EXAM: CHEST - 2 VIEW COMPARISON:  None. FINDINGS: The heart size and mediastinal contours are within normal limits. Both lungs are clear. The visualized skeletal structures are unremarkable. IMPRESSION: No active cardiopulmonary disease. Electronically Signed   By: Jonna Clark M.D.   On: 01/30/2020 15:33       Subjective: No chest pain    Discharge Exam: Vitals:   01/31/20 0930 01/31/20 1000  BP:  129/66  Pulse: 67 66  Resp:    Temp:    SpO2: 100% 99%   Vitals:   01/31/20 0830 01/31/20 0900 01/31/20 0930 01/31/20 1000  BP:    129/66  Pulse: 74 70 67 66  Resp:      Temp:      TempSrc:      SpO2: 100% 97% 100% 99%  Weight:      Height:        General: Pt is alert, awake, not in acute distress Cardiovascular: RRR, S1/S2 +, no rubs, no gallops Respiratory: CTA bilaterally, no wheezing, no rhonchi Abdominal: Soft, NT, ND, bowel sounds + Extremities: no edema, no cyanosis    The results of significant diagnostics from this hospitalization (including imaging, microbiology, ancillary and laboratory) are listed below for reference.     Microbiology: Recent Results (from the past 240 hour(s))  SARS Coronavirus 2 by RT PCR (hospital order, performed in [facility] hospital lab) Nasopharyngeal Nasopharyngeal Swab     Status: None   Collection Time: 01/30/20  8:40 PM   Specimen: Nasopharyngeal Swab  Result Value Ref Range Status   SARS Coronavirus 2 NEGATIVE NEGATIVE Final    Comment: (NOTE) SARS-CoV-2 target  nucleic acids are NOT DETECTED.  The SARS-CoV-2 RNA is generally detectable in upper and lower respiratory specimens during the acute phase of infection. The lowest concentration of SARS-CoV-2 viral copies this assay can detect is 250 copies / mL. A negative result does not preclude SARS-CoV-2 infection and should not be used as the sole basis for treatment or other patient management decisions.  A negative result may occur with improper specimen collection / handling, submission of specimen other than nasopharyngeal swab, presence of viral mutation(s) within the areas targeted by this assay, and inadequate number of viral copies (<250 copies / mL). A negative result must be combined with clinical observations, patient history, and epidemiological information.  Fact Sheet for Patients:  BoilerBrush.com.cy  Fact Sheet for Healthcare Providers: https://pope.com/  This test is not yet approved or  cleared by the [place] FDA and has been authorized for detection and/or diagnosis of SARS-CoV-2 by FDA under an Emergency Use Authorization (EUA).  This EUA will remain in effect (meaning this test can be used) for the duration of the COVID-19 declaration under Section 564(b)(1) of the Act, 21 U.S.C. section 360bbb-3(b)(1), unless the authorization is terminated or revoked sooner.  Performed at [facility], [address] [place], [place] [address]      Labs: BNP (last 3 results) Recent Labs    01/30/20 2138  BNP 52.4   Basic Metabolic Panel: Recent Labs  Lab 01/30/20 1503 01/30/20 2138  NA 136  --   K 4.8  --   CL 103  --   CO2 24  --   GLUCOSE 126*  --   BUN 38*  --   CREATININE 1.74* 1.72*  CALCIUM 9.2  --    Liver Function Tests: No results for input(s): AST, ALT, ALKPHOS, BILITOT, PROT, ALBUMIN in the last 168 hours. No results for input(s): LIPASE, AMYLASE in the last 168 hours. No results for  input(s): AMMONIA in the last 168 hours. CBC: Recent Labs  Lab 01/30/20 1503 01/30/20 2138  WBC 9.2 9.3  HGB 13.7 14.1  HCT 41.2 42.2  MCV 85.1 85.3  PLT 284 275   Cardiac Enzymes: No results for input(s): CKTOTAL, CKMB, CKMBINDEX, TROPONINI in the last 168 hours. BNP: Invalid input(s): POCBNP CBG: Recent Labs  Lab 01/31/20 0114 01/31/20 0354 01/31/20 0951  GLUCAP 312* 260* 228*   D-Dimer No results for input(s): DDIMER in the last 72 hours. Hgb A1c Recent Labs    01/31/20 0125  HGBA1C 12.8*   Lipid Profile Recent Labs    01/31/20 0125  CHOL 191  HDL 31*  LDLCALC UNABLE TO CALCULATE IF TRIGLYCERIDE OVER 400 mg/dL  TRIG 284*  CHOLHDL 6.2  LDLDIRECT 83.6   Thyroid function studies Recent Labs    01/30/20 2138  TSH 0.473   Anemia work up No results for input(s): VITAMINB12, FOLATE, FERRITIN, TIBC, IRON, RETICCTPCT in the last 72 hours. Urinalysis    Component Value Date/Time   COLORURINE YELLOW (A) 04/20/2018 0308   APPEARANCEUR Clear 05/05/2018 1336   LABSPEC 1.020 04/20/2018 0308   PHURINE 6.0 04/20/2018 0308   GLUCOSEU Negative 05/05/2018 1336   HGBUR MODERATE (A) 04/20/2018 0308   BILIRUBINUR Negative 05/05/2018 1336   KETONESUR 5 (A) 04/20/2018 0308   PROTEINUR Negative 05/05/2018 1336   PROTEINUR 30 (A) 04/20/2018 0308   NITRITE Negative 05/05/2018 1336   NITRITE NEGATIVE 04/20/2018 0308   LEUKOCYTESUR Negative 05/05/2018 1336   Sepsis Labs Invalid input(s): PROCALCITONIN,  WBC,  LACTICIDVEN Microbiology Recent Results (from the past 240 hour(s))  SARS Coronavirus 2 by RT PCR (hospital order, performed in [facility] hospital lab) Nasopharyngeal Nasopharyngeal Swab     Status: None   Collection Time: 01/30/20  8:40 PM   Specimen: Nasopharyngeal Swab  Result Value Ref Range Status   SARS Coronavirus 2 NEGATIVE NEGATIVE Final    Comment: (NOTE) SARS-CoV-2 target nucleic acids are NOT DETECTED.  The SARS-CoV-2 RNA is generally detectable  in upper and lower respiratory specimens during the acute phase of infection. The lowest concentration of SARS-CoV-2 viral copies this assay can detect is 250 copies / mL. A negative result does not preclude SARS-CoV-2 infection and should not be used as the sole basis for  treatment or other patient management decisions.  A negative result may occur with improper specimen collection / handling, submission of specimen other than nasopharyngeal swab, presence of viral mutation(s) within the areas targeted by this assay, and inadequate number of viral copies (<250 copies / mL). A negative result must be combined with clinical observations, patient history, and epidemiological information.  Fact Sheet for Patients:   BoilerBrush.com.cyhttps://www.fda.gov/media/136312/download  Fact Sheet for Healthcare Providers: https://pope.com/https://www.fda.gov/media/136313/download  This test is not yet approved or  cleared by the [place] FDA and has been authorized for detection and/or diagnosis of SARS-CoV-2 by FDA under an Emergency Use Authorization (EUA).  This EUA will remain in effect (meaning this test can be used) for the duration of the COVID-19 declaration under Section 564(b)(1) of the Act, 21 U.S.C. section 360bbb-3(b)(1), unless the authorization is terminated or revoked sooner.  Performed at [facility], [address] [place], [place] [id number]      Time coordinating discharge: 36 minutes  SIGNED:   Kathlen ModyVijaya Shanyce Daris, MD  [facility] 01/31/2020, 4:37 PM

## 2020-01-31 NOTE — Consult Note (Signed)
Cardiology Consultation:   Patient ID: Nicole Villegas MRN: [id number]; DOB: Aug 26, 1965  Admit date: 01/30/2020 Date of Consult: 01/31/2020  Primary Care Provider: Marya Fossa, PA-C Primary Cardiologist: Tona Sensing, Dr. Mariah Milling rounding Primary Electrophysiologist:  None    Patient Profile:   Nicole Villegas is a 54 y.o. female with a hx of hypertension, DM2, and no previously known cardiac history, and who is being seen today for the evaluation of chest pain and elevated Tn at the request of Dr. Marisa Severin.  History of Present Illness:   [name] is a 54 year old female with PMH as above.    She has no previously known cardiac history.  She reports a significant amount of stress and anxiety surrounding her employment.  She recently had pulled a significant amount of towels, which was stressful for her.  Chest pain was further described as located on the left side of her chest and radiating to her right chest.  It was a pressure rated 8/10 in severity with radiation to both her neck and back.  Chest pain episodes lasted approximately 10 minutes and were recurrent over the last couple of weeks.  She reported residual weakness following her CP but denied any other associated symptoms.  She contacted her doctor 2/2 her weakness and was advised to go to the emergency department.  In the ED, vitals significant for BP 113/66, HR 69 bpm, RR 16.  Labs as below and showed  creatinine 1.74 and BUN 38.  Hemoglobin A1c elevated at 12.8.  High-sensitivity troponin minimally elevated and flat trending 20  22  17. EKG without acute ST/T changes. CXR without active cardiopulmonary dz.   Heart Pathway Score:     Past Medical History:  Diagnosis Date  . Diabetes mellitus without complication (HCC)   . Hyperlipidemia   . Hypertension     Past Surgical History:  Procedure Laterality Date  . CESAREAN SECTION     x3     Home Medications:  Prior to Admission medications   Medication Sig Start Date End  Date Taking? Authorizing Provider  atenolol (TENORMIN) 50 MG tablet Take 50 mg by mouth daily.   Yes [provider]  cyclobenzaprine (FLEXERIL) 5 MG tablet Take 5 mg by mouth at bedtime.    Yes [provider]  fexofenadine (ALLEGRA) 60 MG tablet Take 60 mg by mouth 2 (two) times daily as needed for allergies.    Yes [provider]  fluticasone (FLONASE) 50 MCG/ACT nasal spray Place 2 sprays into both nostrils daily as needed for allergies.    Yes [provider]  gabapentin (NEURONTIN) 300 MG capsule Take 300 mg by mouth 2 (two) times daily.   Yes [provider]  GLIPIZIDE XL 10 MG 24 hr tablet Take 10 mg by mouth 2 (two) times daily.   Yes [provider]  insulin aspart (NOVOLOG) 100 UNIT/ML FlexPen Inject 39 Units into the skin See admin instructions. Inject 39 units twice a day before lunch and dinner.   Yes [provider]  insulin detemir (LEVEMIR) 100 UNIT/ML injection Inject 50 Units into the skin at bedtime.    Yes [provider]  lisinopril-hydrochlorothiazide (PRINZIDE,ZESTORETIC) 20-12.5 MG tablet Take 2 tablets by mouth daily.    Yes [provider]  sitaGLIPtin-metformin (JANUMET) 50-1000 MG tablet Take 1 tablet by mouth 2 (two) times daily with a meal.   Yes [provider]  aspirin EC 81 MG EC tablet Take 1 tablet (81 mg total) by mouth daily.  Swallow whole. 02/01/20   Kathlen ModyAkula, Vijaya, MD  atorvastatin (LIPITOR) 80 MG tablet Take 1 tablet (80 mg total) by mouth daily. 01/31/20   Kathlen ModyAkula, Vijaya, MD  pantoprazole (PROTONIX) 40 MG tablet Take 1 tablet (40 mg total) by mouth daily. 01/31/20 03/31/20  Kathlen ModyAkula, Vijaya, MD    Inpatient Medications: Scheduled Meds: . aspirin EC  81 mg Oral Daily  . atenolol  50 mg Oral Daily  . atorvastatin  80 mg Oral Daily  . gabapentin  100 mg Oral BID  . heparin  5,000 Units Subcutaneous Q8H  . insulin aspart  0-15 Units Subcutaneous Q4H  . loratadine  10 mg Oral  Daily   Continuous Infusions: . sodium chloride 75 mL/hr at 01/31/20 0010   PRN Meds: acetaminophen, fluticasone, nitroGLYCERIN, ondansetron (ZOFRAN) IV  Allergies:   No Known Allergies  Social History:   Social History   Socioeconomic History  . Marital status: Married    Spouse name: Not on file  . Number of children: Not on file  . Years of education: Not on file  . Highest education level: Not on file  Occupational History  . Not on file  Tobacco Use  . Smoking status: Never Smoker  . Smokeless tobacco: Never Used  Vaping Use  . Vaping Use: Never used  Substance and Sexual Activity  . Alcohol use: Not Currently  . Drug use: Not Currently  . Sexual activity: Not on file  Other Topics Concern  . Not on file  Social History Narrative  . Not on file   Social Determinants of Health   Financial Resource Strain:   . Difficulty of Paying Living Expenses:   Food Insecurity:   . Worried About Programme researcher, broadcasting/film/videounning Out of Food in the Last Year:   . Baristaan Out of Food in the Last Year:   Transportation Needs:   . Freight forwarderLack of Transportation (Medical):   Marland Kitchen. Lack of Transportation (Non-Medical):   Physical Activity:   . Days of Exercise per Week:   . Minutes of Exercise per Session:   Stress:   . Feeling of Stress :   Social Connections:   . Frequency of Communication with Friends and Family:   . Frequency of Social Gatherings with Friends and Family:   . Attends Religious Services:   . Active Member of Clubs or Organizations:   . Attends BankerClub or Organization Meetings:   Marland Kitchen. Marital Status:   Intimate Partner Violence:   . Fear of Current or Ex-Partner:   . Emotionally Abused:   Marland Kitchen. Physically Abused:   . Sexually Abused:     Family History:   History reviewed. No pertinent family history.   ROS:  Please see the history of present illness.  Review of Systems  Constitutional: Positive for malaise/fatigue.  Respiratory: Negative for shortness of breath.   Cardiovascular: Positive for  chest pain. Negative for palpitations, orthopnea and leg swelling.  Gastrointestinal: Negative for blood in stool.  Genitourinary: Negative for hematuria.  Psychiatric/Behavioral: The patient is nervous/anxious.     All other ROS reviewed and negative.     Physical Exam/Data:   Vitals:   01/31/20 0400 01/31/20 0700 01/31/20 0730 01/31/20 0757  BP:    113/61  Pulse:  68 67 71  Resp:      Temp: 98 F (36.7 C)     TempSrc: Oral     SpO2:  93% 99% 97%  Weight:      Height:       No intake  or output data in the 24 hours ending 01/31/20 0835 Last 3 Weights 01/30/2020 05/05/2018 04/23/2018  Weight (lbs) 162 lb 162 lb 9.6 oz 174 lb 1.6 oz  Weight (kg) 73.483 kg 73.755 kg 78.971 kg     Body mass index is 30.61 kg/m.  General:  Well nourished, well developed, in no acute distress HEENT: normal Neck: no JVD Vascular: No carotid bruits; radial pulses 2+ bilaterally Cardiac:  normal S1, S2; RRR; no murmur  Lungs:  clear to auscultation bilaterally, no wheezing, rhonchi or rales  Abd: soft, nontender, no hepatomegaly  Ext: no edema Musculoskeletal:  No deformities, BUE and BLE strength normal and equal Skin: warm and dry  Neuro:  No focal abnormalities noted Psych:  Normal affect   EKG:  The EKG was personally reviewed and demonstrates:  NSR, 68bpm Telemetry:  Telemetry was personally reviewed and demonstrates: NSR  Relevant CV Studies: Echo pending  Laboratory Data:  High Sensitivity Troponin:   Recent Labs  Lab 01/30/20 1503 01/30/20 1808 01/30/20 2138 01/31/20 0124  TROPONINIHS 20* 22* 17 15     Cardiac EnzymesNo results for input(s): TROPONINI in the last 168 hours. No results for input(s): TROPIPOC in the last 168 hours.  Chemistry Recent Labs  Lab 01/30/20 1503 01/30/20 2138  NA 136  --   K 4.8  --   CL 103  --   CO2 24  --   GLUCOSE 126*  --   BUN 38*  --   CREATININE 1.74* 1.72*  CALCIUM 9.2  --   GFRNONAA 33* 33*  GFRAA 38* 38*  ANIONGAP 9  --       No results for input(s): PROT, ALBUMIN, AST, ALT, ALKPHOS, BILITOT in the last 168 hours. Hematology Recent Labs  Lab 01/30/20 1503 01/30/20 2138  WBC 9.2 9.3  RBC 4.84 4.95  HGB 13.7 14.1  HCT 41.2 42.2  MCV 85.1 85.3  MCH 28.3 28.5  MCHC 33.3 33.4  RDW 13.1 13.1  PLT 284 275   BNP Recent Labs  Lab 01/30/20 2138  BNP 52.4    DDimer No results for input(s): DDIMER in the last 168 hours.   Radiology/Studies:  DG Chest 2 View  Result Date: 01/30/2020 CLINICAL DATA:  Chest pain EXAM: CHEST - 2 VIEW COMPARISON:  None. FINDINGS: The heart size and mediastinal contours are within normal limits. Both lungs are clear. The visualized skeletal structures are unremarkable. IMPRESSION: No active cardiopulmonary disease. Electronically Signed   By: Jonna Clark M.D.   On: 01/30/2020 15:33    Assessment and Plan:    Chest pain with elevated HS Tn --No current CP. Reports earlier L sided CP with high stress job and over the last two weeks.  Not volume overloaded on exam. --EKG without significant ST/T changes.  --HS Tn minimally elevated, flat trending.  --Not consistent with ACS. --Suspect supply demand ischemia in the setting of intermittently elevated blood pressure, elevated Cr, and uncontrolled A1c. --No further ischemic workup needed as an inpatient. --Echo ordered and pending. --If further CP, could consider outpatient stress test / follow-up. Will defer for now unless echocardiogram shows significantly reduced EF or acute structural changes.  AKI --Cr elevated. Consider as contributing to her elevated HS Tn.  Recommend adequate hydration. Caution with nephrotoxins. Consider that uncontrolled DM2 may also be influencing.  HTN --Continue home medications once renal function recovers.   DM2 --SSI, per IM.  Recommend follow-up with PCP.  For questions or updates, please contact [facility]  Please consult [email or web address] for contact info under     Signed, Lennon Alstrom, PA-C  01/31/2020 8:35 AM

## 2020-02-25 DIAGNOSIS — R778 Other specified abnormalities of plasma proteins: Secondary | ICD-10-CM | POA: Insufficient documentation

## 2020-04-02 IMAGING — US US ABDOMEN LIMITED
1 series · 14 of 25 positions shown · non-contrast
Comparison: Right upper quadrant ultrasound 09/22/2015

CLINICAL DATA: Right upper quadrant pain.

EXAM:
ULTRASOUND ABDOMEN LIMITED RIGHT UPPER QUADRANT

[Series 1: us abdomen limited · 0.24mm/px · 14 of 38 slices shown]
[im 1/38]
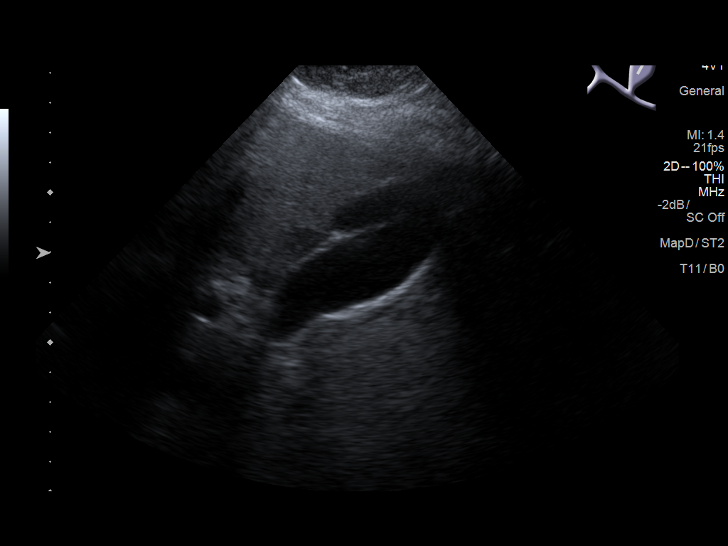
[im 4/38]
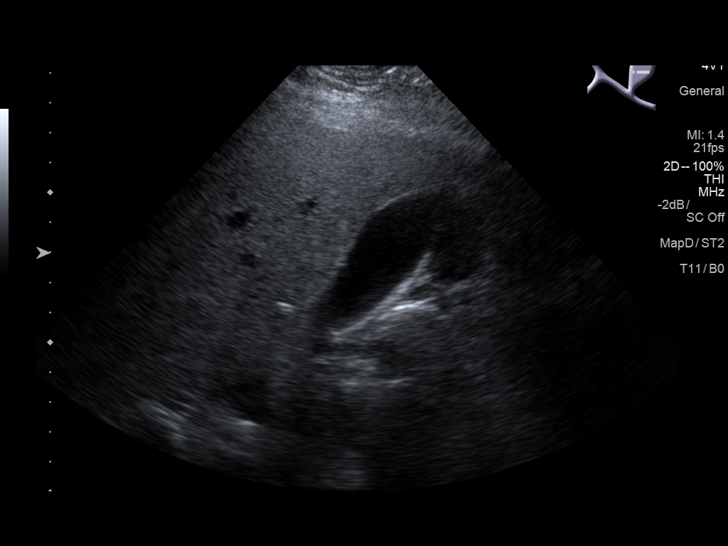
[im 7/38]
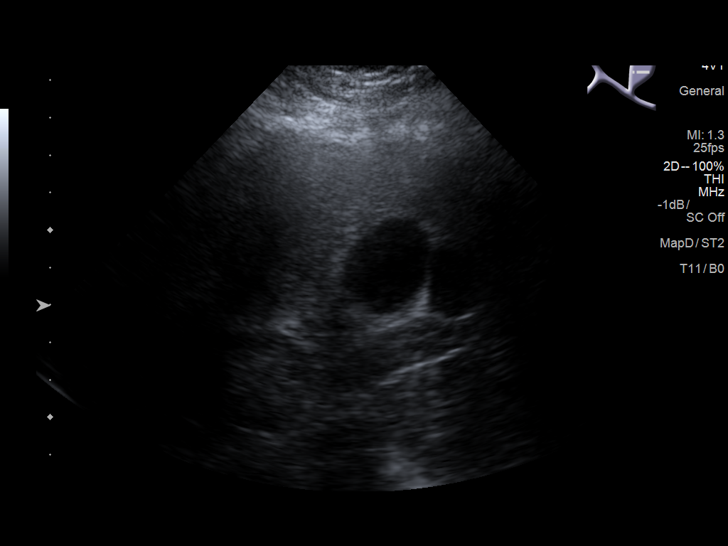
[im 10/38]
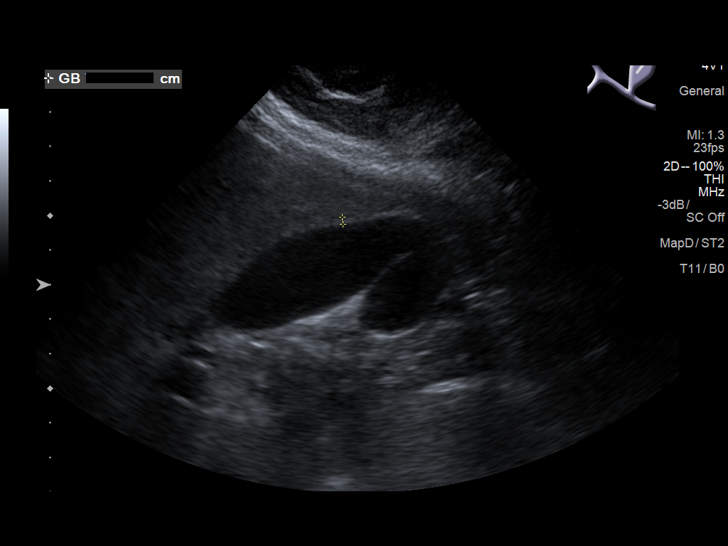
[im 13/38]
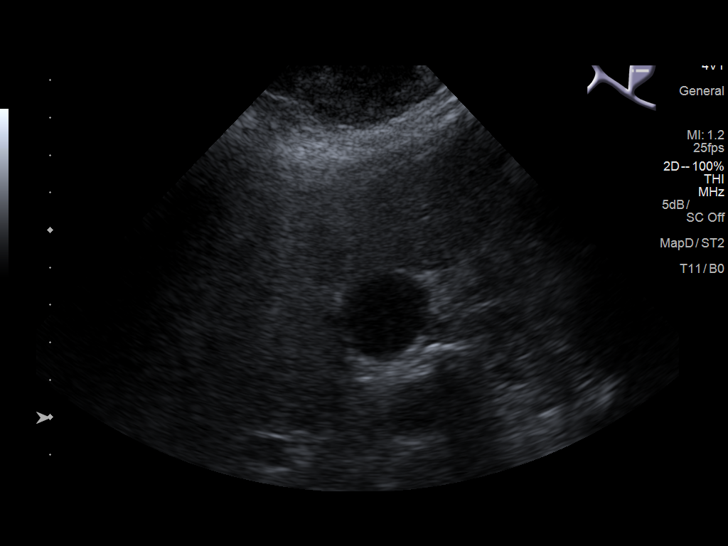
[im 14/38]
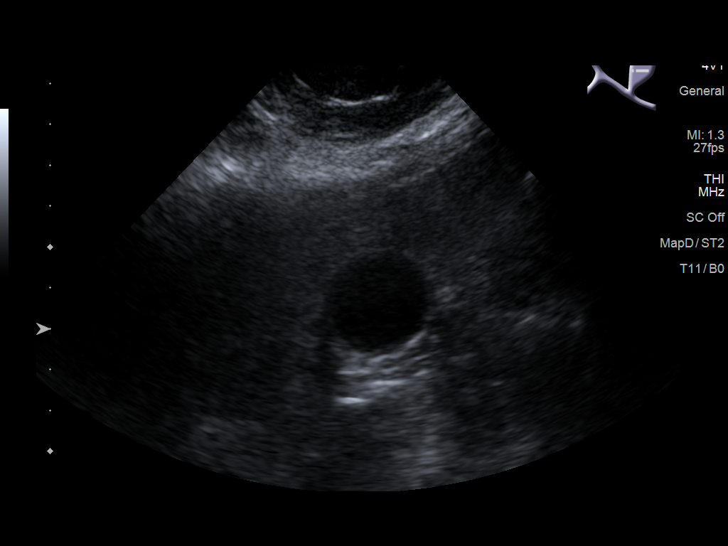
[im 17/38]
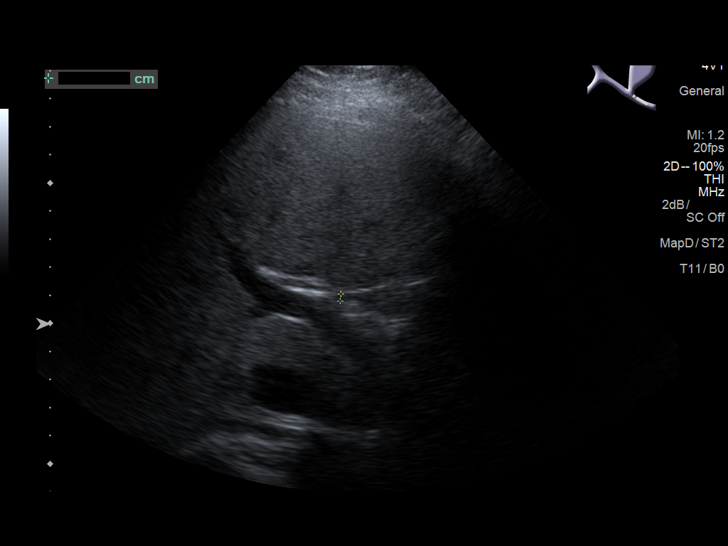
[im 21/38]
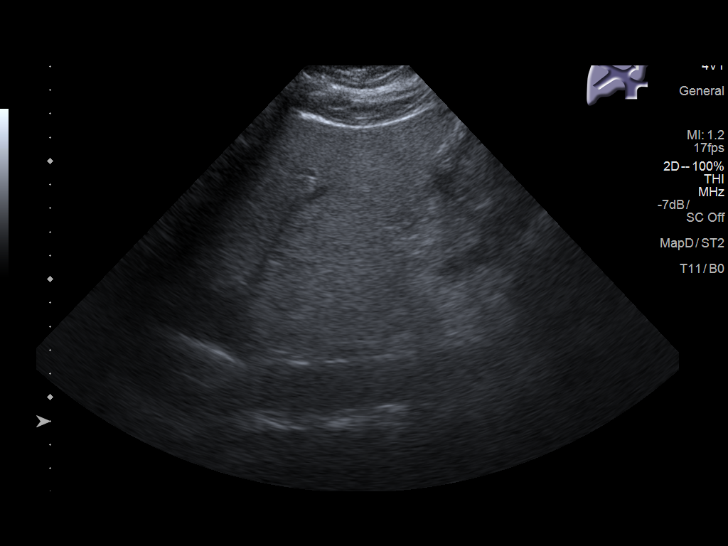
[im 24/38]
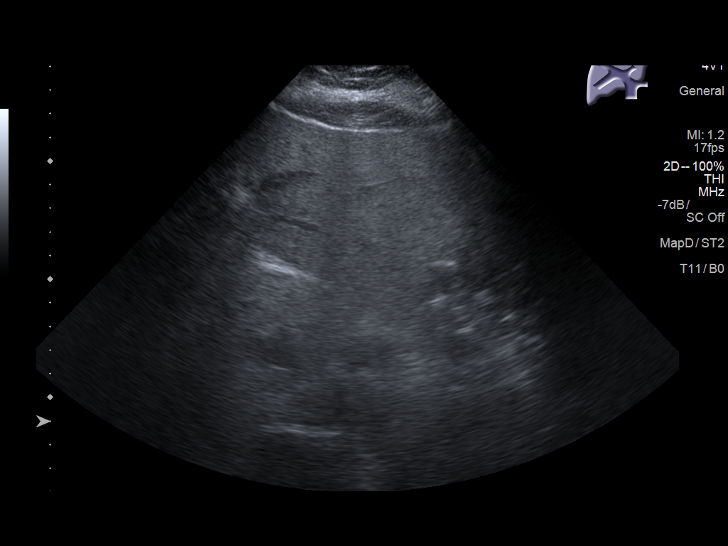
[im 25/38]
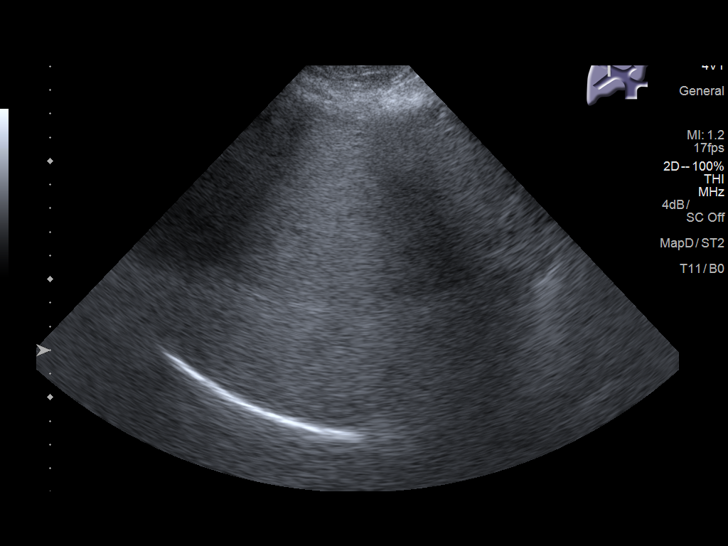
[im 28/38]
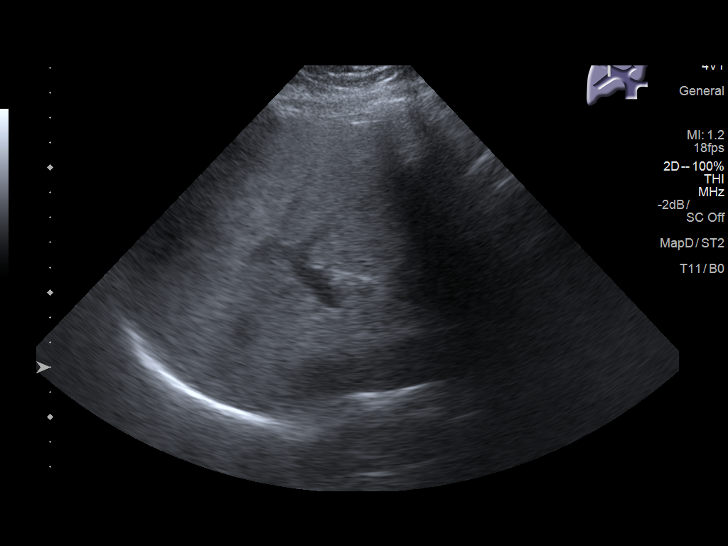
[im 31/38]
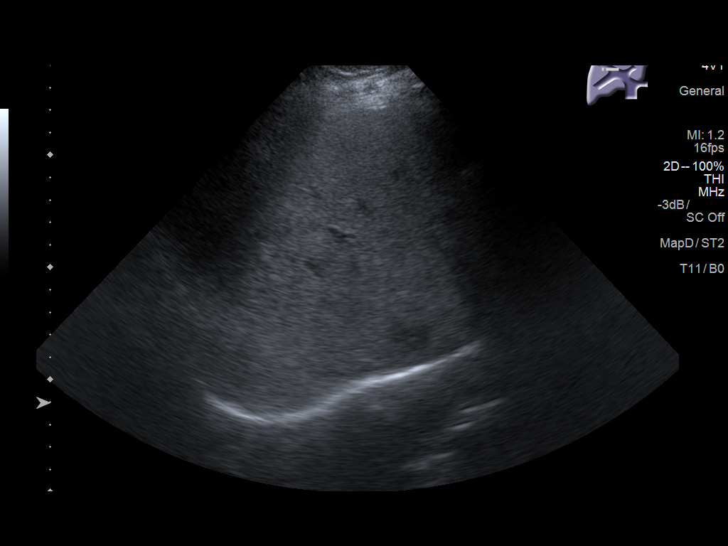
[im 34/38]
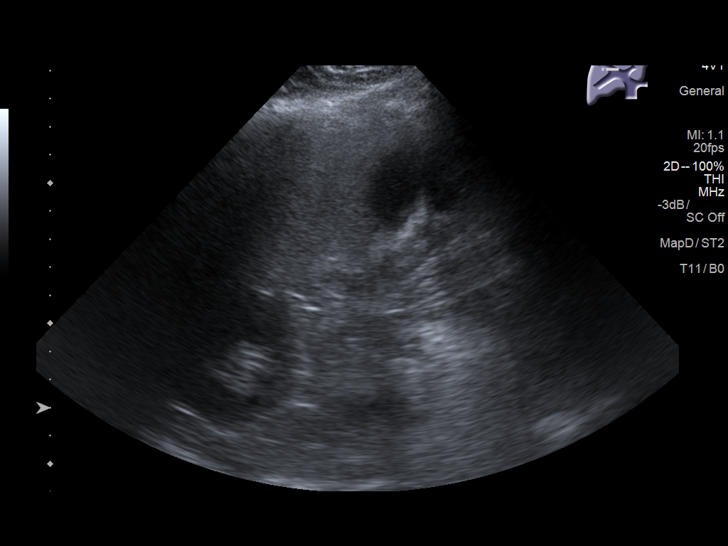
[im 38/38]
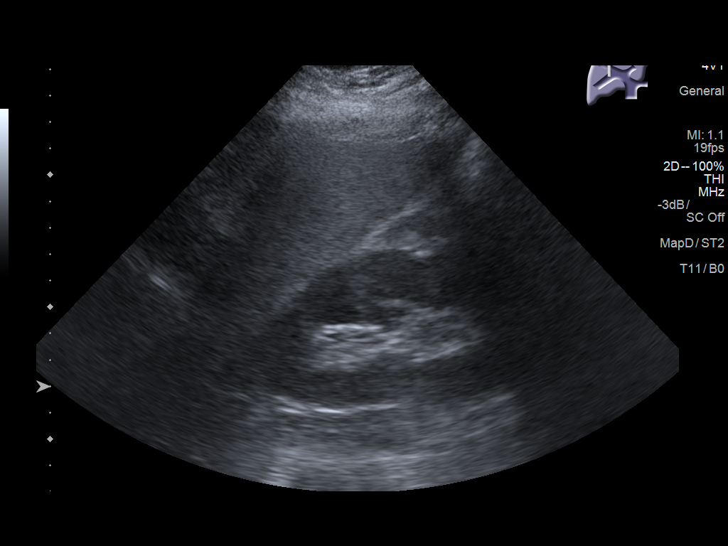

[14 of 25 positions shown; findings below may reference images not displayed]

FINDINGS: Gallbladder:

Physiologically distended, fold in the fundus. No gallstones or wall
thickening visualized. No pericholecystic fluid. No sonographic
Murphy sign noted by sonographer.

Common bile duct:

Diameter: 3 mm, normal.

Liver:

No focal lesion identified. Diffusely increased in parenchymal
echogenicity. Liver parenchyma is difficult to penetrate. Portal
vein is patent on color Doppler imaging with normal direction of
blood flow towards the liver.
IMPRESSION: 1. Normal sonographic appearance of the gallbladder and biliary
tree.
2. Hepatic steatosis.

## 2020-05-29 ENCOUNTER — Other Ambulatory Visit: Payer: Self-pay | Admitting: Physician Assistant

## 2020-05-29 DIAGNOSIS — Z1231 Encounter for screening mammogram for malignant neoplasm of breast: Secondary | ICD-10-CM

## 2021-07-01 ENCOUNTER — Other Ambulatory Visit: Payer: Self-pay | Admitting: Physician Assistant

## 2021-07-01 DIAGNOSIS — H53149 Visual discomfort, unspecified: Secondary | ICD-10-CM

## 2021-07-01 DIAGNOSIS — R110 Nausea: Secondary | ICD-10-CM

## 2021-07-01 DIAGNOSIS — H538 Other visual disturbances: Secondary | ICD-10-CM

## 2021-07-01 DIAGNOSIS — H9313 Tinnitus, bilateral: Secondary | ICD-10-CM

## 2021-07-01 DIAGNOSIS — G43019 Migraine without aura, intractable, without status migrainosus: Secondary | ICD-10-CM

## 2021-07-01 DIAGNOSIS — R200 Anesthesia of skin: Secondary | ICD-10-CM

## 2021-07-06 ENCOUNTER — Other Ambulatory Visit: Payer: Self-pay | Admitting: Family Medicine

## 2021-07-06 DIAGNOSIS — Z1231 Encounter for screening mammogram for malignant neoplasm of breast: Secondary | ICD-10-CM

## 2021-07-14 ENCOUNTER — Inpatient Hospital Stay
Admission: EM | Admit: 2021-07-14 | Discharge: 2021-07-18 | DRG: 854 | Disposition: A | Discharge status: Home / Self Care | Payer: BC Managed Care – PPO | Attending: Internal Medicine | Admitting: Internal Medicine

## 2021-07-14 ENCOUNTER — Emergency Department: Payer: BC Managed Care – PPO

## 2021-07-14 ENCOUNTER — Other Ambulatory Visit: Payer: Self-pay

## 2021-07-14 DIAGNOSIS — E1165 Type 2 diabetes mellitus with hyperglycemia: Secondary | ICD-10-CM | POA: Diagnosis present

## 2021-07-14 DIAGNOSIS — A4151 Sepsis due to Escherichia coli [E. coli]: Secondary | ICD-10-CM | POA: Diagnosis present

## 2021-07-14 DIAGNOSIS — N179 Acute kidney failure, unspecified: Secondary | ICD-10-CM

## 2021-07-14 DIAGNOSIS — E785 Hyperlipidemia, unspecified: Secondary | ICD-10-CM | POA: Diagnosis present

## 2021-07-14 DIAGNOSIS — Z79899 Other long term (current) drug therapy: Secondary | ICD-10-CM | POA: Diagnosis not present

## 2021-07-14 DIAGNOSIS — R6520 Severe sepsis without septic shock: Secondary | ICD-10-CM | POA: Diagnosis present

## 2021-07-14 DIAGNOSIS — Z833 Family history of diabetes mellitus: Secondary | ICD-10-CM

## 2021-07-14 DIAGNOSIS — Z794 Long term (current) use of insulin: Secondary | ICD-10-CM

## 2021-07-14 DIAGNOSIS — E1122 Type 2 diabetes mellitus with diabetic chronic kidney disease: Secondary | ICD-10-CM | POA: Diagnosis present

## 2021-07-14 DIAGNOSIS — N3000 Acute cystitis without hematuria: Secondary | ICD-10-CM

## 2021-07-14 DIAGNOSIS — D696 Thrombocytopenia, unspecified: Secondary | ICD-10-CM | POA: Diagnosis present

## 2021-07-14 DIAGNOSIS — K219 Gastro-esophageal reflux disease without esophagitis: Secondary | ICD-10-CM | POA: Diagnosis present

## 2021-07-14 DIAGNOSIS — Z6830 Body mass index (BMI) 30.0-30.9, adult: Secondary | ICD-10-CM

## 2021-07-14 DIAGNOSIS — E1121 Type 2 diabetes mellitus with diabetic nephropathy: Secondary | ICD-10-CM

## 2021-07-14 DIAGNOSIS — Z8249 Family history of ischemic heart disease and other diseases of the circulatory system: Secondary | ICD-10-CM

## 2021-07-14 DIAGNOSIS — F32A Depression, unspecified: Secondary | ICD-10-CM | POA: Diagnosis present

## 2021-07-14 DIAGNOSIS — Z20822 Contact with and (suspected) exposure to covid-19: Secondary | ICD-10-CM | POA: Diagnosis present

## 2021-07-14 DIAGNOSIS — E860 Dehydration: Secondary | ICD-10-CM | POA: Diagnosis present

## 2021-07-14 DIAGNOSIS — K353 Acute appendicitis with localized peritonitis, without perforation or gangrene: Secondary | ICD-10-CM | POA: Diagnosis present

## 2021-07-14 DIAGNOSIS — N182 Chronic kidney disease, stage 2 (mild): Secondary | ICD-10-CM | POA: Diagnosis present

## 2021-07-14 DIAGNOSIS — E1169 Type 2 diabetes mellitus with other specified complication: Secondary | ICD-10-CM

## 2021-07-14 DIAGNOSIS — I129 Hypertensive chronic kidney disease with stage 1 through stage 4 chronic kidney disease, or unspecified chronic kidney disease: Secondary | ICD-10-CM | POA: Diagnosis present

## 2021-07-14 DIAGNOSIS — N390 Urinary tract infection, site not specified: Secondary | ICD-10-CM | POA: Diagnosis present

## 2021-07-14 DIAGNOSIS — A419 Sepsis, unspecified organism: Secondary | ICD-10-CM | POA: Diagnosis not present

## 2021-07-14 DIAGNOSIS — Z7982 Long term (current) use of aspirin: Secondary | ICD-10-CM

## 2021-07-14 DIAGNOSIS — I10 Essential (primary) hypertension: Secondary | ICD-10-CM | POA: Diagnosis present

## 2021-07-14 DIAGNOSIS — E1129 Type 2 diabetes mellitus with other diabetic kidney complication: Secondary | ICD-10-CM | POA: Diagnosis present

## 2021-07-14 DIAGNOSIS — K3580 Unspecified acute appendicitis: Secondary | ICD-10-CM | POA: Diagnosis present

## 2021-07-14 DIAGNOSIS — Z7984 Long term (current) use of oral hypoglycemic drugs: Secondary | ICD-10-CM

## 2021-07-14 DIAGNOSIS — R7881 Bacteremia: Secondary | ICD-10-CM | POA: Diagnosis not present

## 2021-07-14 LAB — CBC WITH DIFFERENTIAL/PLATELET
Abs Immature Granulocytes: 0.04 10*3/uL (ref 0.00–0.07)
Basophils Absolute: 0 10*3/uL (ref 0.0–0.1)
Basophils Relative: 0 %
Eosinophils Absolute: 0 10*3/uL (ref 0.0–0.5)
Eosinophils Relative: 0 %
HCT: 45 % (ref 36.0–46.0)
Hemoglobin: 15.4 g/dL — ABNORMAL HIGH (ref 12.0–15.0)
Immature Granulocytes: 1 %
Lymphocytes Relative: 5 %
Lymphs Abs: 0.3 10*3/uL — ABNORMAL LOW (ref 0.7–4.0)
MCH: 28.6 pg (ref 26.0–34.0)
MCHC: 34.2 g/dL (ref 30.0–36.0)
MCV: 83.5 fL (ref 80.0–100.0)
Monocytes Absolute: 0 10*3/uL — ABNORMAL LOW (ref 0.1–1.0)
Monocytes Relative: 0 %
Neutro Abs: 6.7 10*3/uL (ref 1.7–7.7)
Neutrophils Relative %: 94 %
Platelets: 237 10*3/uL (ref 150–400)
RBC: 5.39 MIL/uL — ABNORMAL HIGH (ref 3.87–5.11)
RDW: 12.6 % (ref 11.5–15.5)
Smear Review: NORMAL
WBC: 7.2 10*3/uL (ref 4.0–10.5)
nRBC: 0 % (ref 0.0–0.2)

## 2021-07-14 LAB — LACTIC ACID, PLASMA
Lactic Acid, Venous: 7.1 mmol/L (ref 0.5–1.9)
Lactic Acid, Venous: 5.9 mmol/L (ref 0.5–1.9)

## 2021-07-14 LAB — URINALYSIS, ROUTINE W REFLEX MICROSCOPIC
Bilirubin Urine: NEGATIVE
Glucose, UA: >=500 mg/dL — AB
Hgb urine dipstick: NEGATIVE
Ketones, ur: 5 mg/dL — AB
Leukocytes,Ua: NEGATIVE
Nitrite: NEGATIVE
Protein, ur: 100 mg/dL — AB
Specific Gravity, Urine: 1.023 (ref 1.005–1.030)
pH: 5 (ref 5.0–8.0)

## 2021-07-14 LAB — TYPE AND SCREEN
ABO/RH(D): O POS
Antibody Screen: NEGATIVE

## 2021-07-14 LAB — COMPREHENSIVE METABOLIC PANEL
ALT: 15 U/L (ref 0–44)
AST: 31 U/L (ref 15–41)
Albumin: 4.1 g/dL (ref 3.5–5.0)
Alkaline Phosphatase: 95 U/L (ref 38–126)
Anion gap: 15 (ref 5–15)
BUN: 20 mg/dL (ref 6–20)
CO2: 23 mmol/L (ref 22–32)
Calcium: 9.5 mg/dL (ref 8.9–10.3)
Chloride: 88 mmol/L — ABNORMAL LOW (ref 98–111)
Creatinine, Ser: 1.12 mg/dL — ABNORMAL HIGH (ref 0.44–1.00)
GFR, Estimated: 58 mL/min — ABNORMAL LOW (ref >60)
Glucose, Bld: 522 mg/dL (ref 70–99)
Potassium: 4.1 mmol/L (ref 3.5–5.1)
Sodium: 126 mmol/L — ABNORMAL LOW (ref 135–145)
Total Bilirubin: 1.5 mg/dL — ABNORMAL HIGH (ref 0.3–1.2)
Total Protein: 8.3 g/dL — ABNORMAL HIGH (ref 6.5–8.1)

## 2021-07-14 LAB — BRAIN NATRIURETIC PEPTIDE: B Natriuretic Peptide: 204.4 pg/mL — ABNORMAL HIGH (ref 0.0–100.0)

## 2021-07-14 LAB — RESP PANEL BY RT-PCR (FLU A&B, COVID) ARPGX2
Influenza A by PCR: NEGATIVE
Influenza B by PCR: NEGATIVE
SARS Coronavirus 2 by RT PCR: NEGATIVE

## 2021-07-14 LAB — PROTIME-INR
INR: 1.1 (ref 0.8–1.2)
Prothrombin Time: 14 seconds (ref 11.4–15.2)

## 2021-07-14 LAB — APTT: aPTT: 32 seconds (ref 24–36)

## 2021-07-14 LAB — CBG MONITORING, ED
Glucose-Capillary: 364 mg/dL — ABNORMAL HIGH (ref 70–99)
Glucose-Capillary: 311 mg/dL — ABNORMAL HIGH (ref 70–99)

## 2021-07-14 LAB — GLUCOSE, CAPILLARY: Glucose-Capillary: 166 mg/dL — ABNORMAL HIGH (ref 70–99)

## 2021-07-14 LAB — HIV ANTIBODY (ROUTINE TESTING W REFLEX): HIV Screen 4th Generation wRfx: NONREACTIVE

## 2021-07-14 LAB — PROCALCITONIN: Procalcitonin: 8.4 ng/mL

## 2021-07-14 MED ORDER — PIPERACILLIN-TAZOBACTAM 3.375 G IVPB
3.3750 g | Freq: Three times a day (TID) | INTRAVENOUS | Status: DC
  Administered 2021-07-14 – 2021-07-15 (×2): 3.375 g via INTRAVENOUS
  Filled 2021-07-14 (×2): qty 50

## 2021-07-14 MED ORDER — INSULIN GLARGINE-YFGN 100 UNIT/ML ~~LOC~~ SOLN
45.0000 [IU] | Freq: Every day | SUBCUTANEOUS | Status: DC
  Administered 2021-07-14 – 2021-07-18 (×4): 45 [IU] via SUBCUTANEOUS
  Filled 2021-07-14 (×5): qty 0.45

## 2021-07-14 MED ORDER — SODIUM CHLORIDE 0.9 % IV BOLUS (SEPSIS)
1000.0000 mL | Freq: Once | INTRAVENOUS | Status: AC
  Administered 2021-07-14: 14:00:00 1000 mL via INTRAVENOUS

## 2021-07-14 MED ORDER — MUPIROCIN 2 % EX OINT
1.0000 "application " | TOPICAL_OINTMENT | Freq: Two times a day (BID) | CUTANEOUS | Status: DC
  Administered 2021-07-15 – 2021-07-18 (×7): 1 via NASAL
  Filled 2021-07-14: qty 22

## 2021-07-14 MED ORDER — DEXTROSE IN LACTATED RINGERS 5 % IV SOLN: INTRAVENOUS | Status: DC

## 2021-07-14 MED ORDER — SODIUM CHLORIDE 0.9 % IV BOLUS (SEPSIS): 250.0000 mL | Freq: Once | INTRAVENOUS | Status: DC

## 2021-07-14 MED ORDER — INSULIN ASPART 100 UNIT/ML IJ SOLN
0.0000 [IU] | Freq: Three times a day (TID) | INTRAMUSCULAR | Status: DC
  Administered 2021-07-15 – 2021-07-16 (×3): 3 [IU] via SUBCUTANEOUS
  Administered 2021-07-16: 18:00:00 8 [IU] via SUBCUTANEOUS
  Administered 2021-07-16 – 2021-07-17 (×2): 5 [IU] via SUBCUTANEOUS
  Administered 2021-07-17: 18:00:00 8 [IU] via SUBCUTANEOUS
  Administered 2021-07-17 – 2021-07-18 (×2): 5 [IU] via SUBCUTANEOUS
  Administered 2021-07-18: 11 [IU] via SUBCUTANEOUS
  Filled 2021-07-14 (×8): qty 1

## 2021-07-14 MED ORDER — AMLODIPINE BESYLATE 5 MG PO TABS
2.5000 mg | ORAL_TABLET | Freq: Every day | ORAL | Status: DC
  Administered 2021-07-14: 17:00:00 2.5 mg via ORAL
  Filled 2021-07-14: qty 1

## 2021-07-14 MED ORDER — METRONIDAZOLE 500 MG/100ML IV SOLN
500.0000 mg | Freq: Once | INTRAVENOUS | Status: AC
  Administered 2021-07-14: 16:00:00 500 mg via INTRAVENOUS
  Filled 2021-07-14: qty 100

## 2021-07-14 MED ORDER — DEXTROSE 50 % IV SOLN: 0.0000 mL | INTRAVENOUS | Status: DC | PRN

## 2021-07-14 MED ORDER — INSULIN ASPART 100 UNIT/ML IJ SOLN
8.0000 [IU] | Freq: Once | INTRAMUSCULAR | Status: AC
  Administered 2021-07-14: 17:00:00 8 [IU] via SUBCUTANEOUS
  Filled 2021-07-14: qty 1

## 2021-07-14 MED ORDER — ONDANSETRON HCL 4 MG/2ML IJ SOLN: 4.0000 mg | Freq: Three times a day (TID) | INTRAMUSCULAR | Status: DC | PRN

## 2021-07-14 MED ORDER — MORPHINE SULFATE (PF) 4 MG/ML IV SOLN: 4.0000 mg | INTRAVENOUS | Status: DC | PRN

## 2021-07-14 MED ORDER — GABAPENTIN 300 MG PO CAPS
300.0000 mg | ORAL_CAPSULE | Freq: Two times a day (BID) | ORAL | Status: DC
  Administered 2021-07-14 – 2021-07-18 (×8): 300 mg via ORAL
  Filled 2021-07-14 (×9): qty 1

## 2021-07-14 MED ORDER — INSULIN ASPART 100 UNIT/ML IJ SOLN
0.0000 [IU] | Freq: Every day | INTRAMUSCULAR | Status: DC
  Administered 2021-07-15: 22:00:00 3 [IU] via SUBCUTANEOUS
  Administered 2021-07-16 – 2021-07-17 (×2): 2 [IU] via SUBCUTANEOUS
  Filled 2021-07-14 (×3): qty 1

## 2021-07-14 MED ORDER — MORPHINE SULFATE (PF) 2 MG/ML IV SOLN
2.0000 mg | INTRAVENOUS | Status: DC | PRN
  Administered 2021-07-15 – 2021-07-16 (×4): 2 mg via INTRAVENOUS
  Filled 2021-07-14 (×4): qty 1

## 2021-07-14 MED ORDER — INSULIN ASPART 100 UNIT/ML IJ SOLN
8.0000 [IU] | Freq: Once | INTRAMUSCULAR | Status: AC
  Administered 2021-07-14: 15:00:00 8 [IU] via SUBCUTANEOUS
  Filled 2021-07-14: qty 1

## 2021-07-14 MED ORDER — LACTATED RINGERS IV SOLN: INTRAVENOUS | Status: DC

## 2021-07-14 MED ORDER — SODIUM CHLORIDE 0.9 % IV SOLN
2.0000 g | Freq: Once | INTRAVENOUS | Status: AC
  Administered 2021-07-14: 14:00:00 2 g via INTRAVENOUS
  Filled 2021-07-14: qty 20

## 2021-07-14 MED ORDER — IOHEXOL 300 MG/ML  SOLN
100.0000 mL | Freq: Once | INTRAMUSCULAR | Status: AC | PRN
  Administered 2021-07-14: 14:00:00 100 mL via INTRAVENOUS

## 2021-07-14 MED ORDER — INSULIN REGULAR(HUMAN) IN NACL 100-0.9 UT/100ML-% IV SOLN: INTRAVENOUS | Status: DC

## 2021-07-14 MED ORDER — HYDRALAZINE HCL 20 MG/ML IJ SOLN: 5.0000 mg | INTRAMUSCULAR | Status: DC | PRN

## 2021-07-14 MED ORDER — ATENOLOL 50 MG PO TABS
50.0000 mg | ORAL_TABLET | Freq: Every day | ORAL | Status: DC
  Administered 2021-07-14 – 2021-07-18 (×5): 50 mg via ORAL
  Filled 2021-07-14: qty 1
  Filled 2021-07-14: qty 2
  Filled 2021-07-14 (×3): qty 1

## 2021-07-14 MED ORDER — ACETAMINOPHEN 325 MG PO TABS
650.0000 mg | ORAL_TABLET | Freq: Four times a day (QID) | ORAL | Status: DC | PRN
  Administered 2021-07-14 – 2021-07-16 (×4): 650 mg via ORAL
  Filled 2021-07-14 (×5): qty 2

## 2021-07-14 MED ORDER — ONDANSETRON HCL 4 MG/2ML IJ SOLN: 4.0000 mg | Freq: Once | INTRAMUSCULAR | Status: DC

## 2021-07-14 MED ORDER — SODIUM CHLORIDE 0.9 % IV SOLN: INTRAVENOUS | Status: DC

## 2021-07-14 MED ORDER — NORTRIPTYLINE HCL 10 MG PO CAPS
20.0000 mg | ORAL_CAPSULE | Freq: Every day | ORAL | Status: DC
  Administered 2021-07-15 – 2021-07-17 (×3): 20 mg via ORAL
  Filled 2021-07-14 (×6): qty 2

## 2021-07-14 MED ORDER — PANTOPRAZOLE SODIUM 40 MG PO TBEC
40.0000 mg | DELAYED_RELEASE_TABLET | Freq: Every day | ORAL | Status: DC
  Administered 2021-07-14 – 2021-07-18 (×5): 40 mg via ORAL
  Filled 2021-07-14 (×5): qty 1

## 2021-07-14 MED ORDER — POTASSIUM CHLORIDE 10 MEQ/100ML IV SOLN: 10.0000 meq | INTRAVENOUS | Status: DC

## 2021-07-14 NOTE — H&P (Signed)
History and Physical    Nicole Villegas [id number] DOB: 30-Nov-1965 DOA: 07/14/2021  Referring MD/NP/PA:   PCP: Kerri Perches, PA-C   Patient coming from:  The patient is coming from home.  At baseline, pt is independent for most of ADL.        Chief Complaint: Abdominal pain  HPI: Nicole Villegas is a 55 y.o. female with medical history significant of hypertension, hyperlipidemia, diabetes mellitus, GERD, depression, CKD-2, who presents with abdominal pain.  Patient states that she has abdominal pain for more than 2 days, which is located in right lower quadrant, constant, sharp, moderate to severe, nonradiating.  Associated with nausea and multiple episodes of nonbilious nonbloody vomiting.  Patient had 1 episode of loose stool bowel movement earlier, currently no diarrhea.  Patient has fever and chills.  Denies chest pain, cough, shortness breath.  Patient has dysuria, burning on urination and increased urinary frequency.  ED Course: pt was found to have WBC 7.3, lactic acid 7.1, INR 1.1, blood sugar 522 with anion gap 15, negative COVID PCR, urinalysis (hazy appearance, negative leukocyte, many bacteria, WBC 6-10), slightly worsening renal function, temperature 100.8, blood pressure 131/71, heart rate 123, RR 20, oxygen saturation 99% on room air.  Chest x-ray negative.  CT abdomen/pelvis that showed acute pancreatitis.  Patient is admitted to [place] bed as inpatient. Dr. Lysle Pearl of surgery is consulted.   Review of Systems:   General: has fevers, chills, no body weight gain, has poor appetite, has fatigue HEENT: no blurry vision, hearing changes or sore throat Respiratory: no dyspnea, coughing, wheezing CV: no chest pain, no palpitations GI: has nausea, vomiting, abdominal pain, diarrhea, no constipation GU: has dysuria, burning on urination, increased urinary frequency, no hematuria  Ext: no leg edema Neuro: no unilateral weakness, numbness, or tingling, no vision change or hearing  loss Skin: no rash, no skin tear. MSK: No muscle spasm, no deformity, no limitation of range of movement in spin Heme: No easy bruising.  Travel history: No recent long distant travel.  Allergy: No Known Allergies  Past Medical History:  Diagnosis Date   Diabetes mellitus without complication ([place])    Hyperlipidemia    Hypertension     Past Surgical History:  Procedure Laterality Date   CESAREAN SECTION     x3    Social History:  reports that she has never smoked. She has never used smokeless tobacco. She reports that she does not currently use alcohol. She reports that she does not currently use drugs.  Family History:  Family History  Problem Relation Age of Onset   Diabetes Mellitus I Maternal Aunt    Hypertension Maternal Aunt      Prior to Admission medications   Medication Sig Start Date End Date Taking? Authorizing Provider  fexofenadine (ALLEGRA) 60 MG tablet Take 60 mg by mouth 2 (two) times daily as needed for allergies.    Yes [provider]  fluticasone (FLONASE) 50 MCG/ACT nasal spray Place 2 sprays into both nostrils daily as needed for allergies.    Yes [provider]  nortriptyline (PAMELOR) 10 MG capsule Take 2 capsules by mouth at bedtime. 06/18/21  Yes [provider]  amLODipine (NORVASC) 2.5 MG tablet Take 2.5 mg by mouth daily. 06/17/21   [provider]  aspirin EC 81 MG EC tablet Take 1 tablet (81 mg total) by mouth daily. Swallow whole. Patient not taking: Reported on 07/14/2021 02/01/20   Hosie Poisson, MD  atenolol (TENORMIN) 50 MG tablet  Take 50 mg by mouth daily.    [provider]  atorvastatin (LIPITOR) 80 MG tablet Take 1 tablet (80 mg total) by mouth daily. Patient not taking: Reported on 07/14/2021 01/31/20   Hosie Poisson, MD  cyclobenzaprine (FLEXERIL) 5 MG tablet Take 5 mg by mouth at bedtime.  Patient not taking: Reported on 07/14/2021    [provider]  gabapentin (NEURONTIN) 300 MG  capsule Take 300 mg by mouth 2 (two) times daily.    [provider]  GLIPIZIDE XL 10 MG 24 hr tablet Take 10 mg by mouth 2 (two) times daily.    [provider]  insulin aspart (NOVOLOG) 100 UNIT/ML FlexPen Inject 50 Units into the skin See admin instructions. Inject 39 units twice a day before lunch and dinner.    [provider]  insulin detemir (LEVEMIR) 100 UNIT/ML injection Inject 60 Units into the skin at bedtime.    [provider]  lisinopril-hydrochlorothiazide (PRINZIDE,ZESTORETIC) 20-12.5 MG tablet Take 2 tablets by mouth daily.     [provider]  OZEMPIC, 0.25 OR 0.5 MG/DOSE, 2 MG/1.5ML SOPN Inject into the skin. 04/02/21   [provider]  pantoprazole (PROTONIX) 40 MG tablet Take 1 tablet (40 mg total) by mouth daily. 01/31/20 03/31/20  Hosie Poisson, MD  sitaGLIPtin-metformin (JANUMET) 50-1000 MG tablet Take 1 tablet by mouth 2 (two) times daily with a meal.    [provider]    Physical Exam: Vitals:   07/14/21 1430 07/14/21 1500 07/14/21 1600 07/14/21 1630  BP: 124/68 131/71 121/68 (!) 123/53  Pulse: (!) 112 (!) 113 (!) 109 (!) 108  Resp: 19 20 (!) 28 (!) 22  Temp:    (!) 100.8 F (38.2 C)  TempSrc:    Oral  SpO2: 99% 99% 100% 100%  Weight:      Height:       General: Not in acute distress HEENT:       Eyes: PERRL, EOMI, no scleral icterus.       ENT: No discharge from the ears and nose, no pharynx injection, no tonsillar enlargement.        Neck: No JVD, no bruit, no mass felt. Heme: No neck lymph node enlargement. Cardiac: S1/S2, RRR, No murmurs, No gallops or rubs. Respiratory: No rales, wheezing, rhonchi or rubs. GI: Soft, nondistended, has tenderness in the right lower quadrant, no rebound pain, no organomegaly, BS present. GU: No hematuria Ext: No pitting leg edema bilaterally. 1+DP/PT pulse bilaterally. Musculoskeletal: No joint deformities, No joint redness or warmth, no limitation of ROM in  spin. Skin: No rashes.  Neuro: Alert, oriented X3, cranial nerves II-XII grossly intact, moves all extremities normally. Psych: Patient is not psychotic, no suicidal or hemocidal ideation.  Labs on Admission: I have personally reviewed following labs and imaging studies  CBC: Recent Labs  Lab 07/14/21 1219  WBC 7.2  NEUTROABS 6.7  HGB 15.4*  HCT 45.0  MCV 83.5  PLT 123XX123   Basic Metabolic Panel: Recent Labs  Lab 07/14/21 1219  NA 126*  K 4.1  CL 88*  CO2 23  GLUCOSE 522*  BUN 20  CREATININE 1.12*  CALCIUM 9.5   GFR: Estimated Creatinine Clearance: 52.1 mL/min (A) (by C-G formula based on SCr of 1.12 mg/dL (H)). Liver Function Tests: Recent Labs  Lab 07/14/21 1219  AST 31  ALT 15  ALKPHOS 95  BILITOT 1.5*  PROT 8.3*  ALBUMIN 4.1   No results for input(s): LIPASE, AMYLASE in the  last 168 hours. No results for input(s): AMMONIA in the last 168 hours. Coagulation Profile: Recent Labs  Lab 07/14/21 1219  INR 1.1   Cardiac Enzymes: No results for input(s): CKTOTAL, CKMB, CKMBINDEX, TROPONINI in the last 168 hours. BNP (last 3 results) No results for input(s): PROBNP in the last 8760 hours. HbA1C: No results for input(s): HGBA1C in the last 72 hours. CBG: Recent Labs  Lab 07/14/21 1628  GLUCAP 364*   Lipid Profile: No results for input(s): CHOL, HDL, LDLCALC, TRIG, CHOLHDL, LDLDIRECT in the last 72 hours. Thyroid Function Tests: No results for input(s): TSH, T4TOTAL, FREET4, T3FREE, THYROIDAB in the last 72 hours. Anemia Panel: No results for input(s): VITAMINB12, FOLATE, FERRITIN, TIBC, IRON, RETICCTPCT in the last 72 hours. Urine analysis:    Component Value Date/Time   COLORURINE YELLOW (A) 07/14/2021 1219   APPEARANCEUR HAZY (A) 07/14/2021 1219   APPEARANCEUR Clear 05/05/2018 1336   LABSPEC 1.023 07/14/2021 1219   PHURINE 5.0 07/14/2021 1219   GLUCOSEU >=500 (A) 07/14/2021 1219   HGBUR NEGATIVE 07/14/2021 1219   BILIRUBINUR NEGATIVE  07/14/2021 1219   BILIRUBINUR Negative 05/05/2018 1336   KETONESUR 5 (A) 07/14/2021 1219   PROTEINUR 100 (A) 07/14/2021 1219   NITRITE NEGATIVE 07/14/2021 1219   LEUKOCYTESUR NEGATIVE 07/14/2021 1219   Sepsis Labs: @LABRCNTIP (procalcitonin:4,lacticidven:4) ) Recent Results (from the past 240 hour(s))  Resp Panel by RT-PCR (Flu A&B, Covid) Nasopharyngeal Swab     Status: None   Collection Time: 07/14/21 12:19 PM   Specimen: Nasopharyngeal Swab; Nasopharyngeal(NP) swabs in vial transport medium  Result Value Ref Range Status   SARS Coronavirus 2 by RT PCR NEGATIVE NEGATIVE Final    Comment: (NOTE) SARS-CoV-2 target nucleic acids are NOT DETECTED.  The SARS-CoV-2 RNA is generally detectable in upper respiratory specimens during the acute phase of infection. The lowest concentration of SARS-CoV-2 viral copies this assay can detect is 138 copies/mL. A negative result does not preclude SARS-Cov-2 infection and should not be used as the sole basis for treatment or other patient management decisions. A negative result may occur with  improper specimen collection/handling, submission of specimen other than nasopharyngeal swab, presence of viral mutation(s) within the areas targeted by this assay, and inadequate number of viral copies(<138 copies/mL). A negative result must be combined with clinical observations, patient history, and epidemiological information. The expected result is Negative.  Fact Sheet for Patients:  07/16/21  Fact Sheet for Healthcare Providers:  BloggerCourse.com  This test is no t yet approved or cleared by the [employer] FDA and  has been authorized for detection and/or diagnosis of SARS-CoV-2 by FDA under an Emergency Use Authorization (EUA). This EUA will remain  in effect (meaning this test can be used) for the duration of the COVID-19 declaration under Section 564(b)(1) of the Act,  21 U.S.C.section 360bbb-3(b)(1), unless the authorization is terminated  or revoked sooner.       Influenza A by PCR NEGATIVE NEGATIVE Final   Influenza B by PCR NEGATIVE NEGATIVE Final    Comment: (NOTE) The Xpert Xpress SARS-CoV-2/FLU/RSV plus assay is intended as an aid in the diagnosis of influenza from Nasopharyngeal swab specimens and should not be used as a sole basis for treatment. Nasal washings and aspirates are unacceptable for Xpert Xpress SARS-CoV-2/FLU/RSV testing.  Fact Sheet for Patients: [place]  Fact Sheet for Healthcare Providers: BloggerCourse.com  This test is not yet approved or cleared by the [employer] FDA and has been authorized for detection and/or diagnosis of SARS-CoV-2  by FDA under an Emergency Use Authorization (EUA). This EUA will remain in effect (meaning this test can be used) for the duration of the COVID-19 declaration under Section 564(b)(1) of the Act, 21 U.S.C. section 360bbb-3(b)(1), unless the authorization is terminated or revoked.  Performed at [facility] [address] [place], [place] [address]      Radiological Exams on Admission: DG Chest 2 View  Result Date: 07/14/2021 CLINICAL DATA:  Suspected Sepsis, abdominal pain, nausea, vomiting and fever EXAM: CHEST - 2 VIEW COMPARISON:  01/30/2020 chest radiograph. FINDINGS: Stable cardiomediastinal silhouette with normal heart size. No pneumothorax. No pleural effusion. Lungs appear clear, with no acute consolidative airspace disease and no pulmonary edema. IMPRESSION: No active cardiopulmonary disease. Electronically Signed   By: Ilona Sorrel M.D.   On: 07/14/2021 12:54   CT ABDOMEN PELVIS W CONTRAST  Result Date: 07/14/2021 CLINICAL DATA:  Abdominal pain, acute, nonlocalized EXAM: CT ABDOMEN AND PELVIS WITH CONTRAST TECHNIQUE: Multidetector CT imaging of the abdomen and pelvis was performed using the standard  protocol following bolus administration of intravenous contrast. CONTRAST:  181mL OMNIPAQUE IOHEXOL 300 MG/ML  SOLN COMPARISON:  None. FINDINGS: Motion artifact is present. Lower chest: No acute abnormality. Hepatobiliary: No focal liver abnormality is seen. No gallstones, gallbladder wall thickening, or biliary dilatation. Pancreas: Unremarkable. Spleen: Unremarkable. Adrenals/Urinary Tract: Adrenals, kidneys, and partially distended bladder are unremarkable. Stomach/Bowel: Stomach is within normal limits. Bowel is normal in caliber. Fluid-filled, dilated appendix with surrounding fat infiltration. Vascular/Lymphatic: Atherosclerosis.  No enlarged lymph nodes. Reproductive: Uterus and bilateral adnexa are unremarkable. Other: No free air.  No abscess. Musculoskeletal: Lumbar spine degenerative changes. IMPRESSION: Acute appendicitis without complication. These results were called by telephone at the time of interpretation on 07/14/2021 at 2:07 pm to provider CARI TRIPLETT , who verbally acknowledged these results. Electronically Signed   By: Macy Mis M.D.   On: 07/14/2021 14:15     EKG: Not done in ED, will get one.   Assessment/Plan Principal Problem:   Acute appendicitis Active Problems:   UTI (urinary tract infection)   Type II diabetes mellitus with renal manifestations (HCC)   CKD (chronic kidney disease), stage II   HLD (hyperlipidemia)   Hypertension   GERD (gastroesophageal reflux disease)   Depression   Severe sepsis (HCC)   Acute appendicitis: CT scan showed acute appendicitis without complications.  Dr. Lysle Pearl of general surgery is consulted.  Patient has elevated blood sugar up to 522, will need to control blood sugar before surgery can be done.  -will admitted to [facility] bed as inpatient -Zosyn IV (patient received 1 dose of Rocephin and Flagyl in ED) -Follow-up blood culture -As needed Zofran, morphine -IV fluid  UTI (urinary tract infection) -On Zosyn -Follow-up of  urine culture  Severe sepsis due to UTI and acute appendicitis: Patient meets criteria for severe sepsis with tachycardia with heart rate 123, fever of 100.8.  Lactic acid is elevated 7.1.  -Follow-up of blood culture and urine culture -will get Procalcitonin and trend lactic acid levels per sepsis protocol. -IVF: 2.25 L of NS bolus in ED, followed by 75 cc/h   Type II diabetes mellitus with renal manifestations [facility]): Recent A1c 12.8, poorly controlled.  Patient taking glipizide, Janumet, Ozempic, NovoLog, Levemir 60 units daily.  Her blood sugar is 522, anion gap 15, bicarbonate 23.  No DKA, but the patient  is at high risk of developing DKA.  Patient was given 8 units of NovoLog and 2 L normal saline  in ED, blood sugar decreased to 360. -continue IVF -will give another 8 units of NovoLog -Start sliding scale insulin -Glargine insulin 45 units daily  CKD-II: Baseline creatinine 0.7 on 05/22/2020.  Her creatinine is 1.12, BUN 20.  Slightly worsening.  Likely due to dehydration and UTI -Hold Prinzide -IV fluid as above  HLD (hyperlipidemia): Patient is not taking Lipitor currently -Follow-up with PCP  Hypertension -Hold amlodipine and Prinzide since patient is at high risk of developing hypotension due to severe sepsis -Continue home atenolol -IV hydralazine as needed  GERD (gastroesophageal reflux disease) -Protonix  Depression -Continue home medications        DVT ppx: SCD Code Status: Full code Family Communication:   Yes, patient's   husband, daughter at bed side.   Disposition Plan:  Anticipate discharge back to previous environment Consults called: Dr. Lysle Pearl of general surgery Admission status and Level of care: Med-Surg:    as inpt       Status is: Inpatient  Remains inpatient appropriate because: Patient has multiple comorbidities, now presents with severe sepsis due to acute appendicitis and UTI.  Lactic acid is up to 7.1.  Patient also has elevated blood sugar  up to 522.  Her presentation is highly complicated.  Patient will need surgery.  She is at high risk of deteriorating.  Need to be treated in the hospital for at least 2 days.          Date of Service 07/14/2021    Ivor Costa [facility]   If 7PM-7AM, please contact night-coverage [email or web address] 07/14/2021, 5:45 PM

## 2021-07-14 NOTE — ED Provider Notes (Signed)
[facility] Emergency Department Provider Note    Event Date/Time   First MD Initiated Contact with Patient 07/14/21 1311     (approximate)  I have reviewed the triage vital signs and the nursing notes.   HISTORY  Chief Complaint Abdominal Pain (/) and Nausea    HPI Nicole Villegas is a 55 y.o. female with a history of diabetes hyperlipidemia as well as hypertension presents to the ER for evaluation of nausea vomiting abdominal pain for the past 2 days.  Pain is in the right lower quadrant radiates across her abdomen.  Is having fevers and chills.  No recent antibiotics.  Denies any chest pain or shortness of breath.  Past Medical History:  Diagnosis Date   Diabetes mellitus without complication ([place])    Hyperlipidemia    Hypertension    History reviewed. No pertinent family history. Past Surgical History:  Procedure Laterality Date   CESAREAN SECTION     x3   Patient Active Problem List   Diagnosis Date Noted   Elevated troponin    Chest pain 01/30/2020   UTI (urinary tract infection) 04/20/2018      Prior to Admission medications   Medication Sig Start Date End Date Taking? Authorizing Provider  aspirin EC 81 MG EC tablet Take 1 tablet (81 mg total) by mouth daily. Swallow whole. 02/01/20   Hosie Poisson, MD  atenolol (TENORMIN) 50 MG tablet Take 50 mg by mouth daily.    [provider]  atorvastatin (LIPITOR) 80 MG tablet Take 1 tablet (80 mg total) by mouth daily. 01/31/20   Hosie Poisson, MD  cyclobenzaprine (FLEXERIL) 5 MG tablet Take 5 mg by mouth at bedtime.     [provider]  fexofenadine (ALLEGRA) 60 MG tablet Take 60 mg by mouth 2 (two) times daily as needed for allergies.     [provider]  fluticasone (FLONASE) 50 MCG/ACT nasal spray Place 2 sprays into both nostrils daily as needed for allergies.     [provider]  gabapentin (NEURONTIN) 300 MG capsule Take 300 mg by mouth 2 (two) times  daily.    [provider]  GLIPIZIDE XL 10 MG 24 hr tablet Take 10 mg by mouth 2 (two) times daily.    [provider]  insulin aspart (NOVOLOG) 100 UNIT/ML FlexPen Inject 39 Units into the skin See admin instructions. Inject 39 units twice a day before lunch and dinner.    [provider]  insulin detemir (LEVEMIR) 100 UNIT/ML injection Inject 50 Units into the skin at bedtime.     [provider]  lisinopril-hydrochlorothiazide (PRINZIDE,ZESTORETIC) 20-12.5 MG tablet Take 2 tablets by mouth daily.     [provider]  pantoprazole (PROTONIX) 40 MG tablet Take 1 tablet (40 mg total) by mouth daily. 01/31/20 03/31/20  Hosie Poisson, MD  sitaGLIPtin-metformin (JANUMET) 50-1000 MG tablet Take 1 tablet by mouth 2 (two) times daily with a meal.    [provider]    Allergies Patient has no known allergies.    Social History Social History   Tobacco Use   Smoking status: Never   Smokeless tobacco: Never  Vaping Use   Vaping Use: Never used  Substance Use Topics   Alcohol use: Not Currently   Drug use: Not Currently    Review of Systems Patient denies headaches, rhinorrhea, blurry vision, numbness, shortness of breath, chest pain, edema, cough, abdominal pain, nausea, vomiting, diarrhea, dysuria, fevers, rashes or hallucinations unless otherwise stated  above in HPI. ____________________________________________   PHYSICAL EXAM:  VITAL SIGNS: Vitals:   07/14/21 1206  BP: 126/81  Pulse: (!) 123  Resp: 18  Temp: 99.4 F (37.4 C)  SpO2: 100%    Constitutional: Alert and oriented.  Eyes: Conjunctivae are normal.  Head: Atraumatic. Nose: No congestion/rhinnorhea. Mouth/Throat: Mucous membranes are moist.   Neck: No stridor. Painless ROM.  Cardiovascular: Normal rate, regular rhythm. Grossly normal heart sounds.  Good peripheral circulation. Respiratory: Normal respiratory effort.  No retractions. Lungs  CTAB. Gastrointestinal: Soft with mild rlq ttp no guarding or rebound No distention. No abdominal bruits. No CVA tenderness. Genitourinary:  Musculoskeletal: No lower extremity tenderness nor edema.  No joint effusions. Neurologic:  Normal speech and language. No gross focal neurologic deficits are appreciated. No facial droop Skin:  Skin is warm, dry and intact. No rash noted. Psychiatric: Mood and affect are normal. Speech and behavior are normal.  ____________________________________________   LABS (all labs ordered are listed, but only abnormal results are displayed)  Results for orders placed or performed during the hospital encounter of 07/14/21 (from the past 24 hour(s))  Resp Panel by RT-PCR (Flu A&B, Covid) Nasopharyngeal Swab     Status: None   Collection Time: 07/14/21 12:19 PM   Specimen: Nasopharyngeal Swab; Nasopharyngeal(NP) swabs in vial transport medium  Result Value Ref Range   SARS Coronavirus 2 by RT PCR NEGATIVE NEGATIVE   Influenza A by PCR NEGATIVE NEGATIVE   Influenza B by PCR NEGATIVE NEGATIVE  Comprehensive metabolic panel     Status: Abnormal   Collection Time: 07/14/21 12:19 PM  Result Value Ref Range   Sodium 126 (L) 135 - 145 mmol/L   Potassium 4.1 3.5 - 5.1 mmol/L   Chloride 88 (L) 98 - 111 mmol/L   CO2 23 22 - 32 mmol/L   Glucose, Bld 522 (HH) 70 - 99 mg/dL   BUN 20 6 - 20 mg/dL   Creatinine, Ser 1.12 (H) 0.44 - 1.00 mg/dL   Calcium 9.5 8.9 - 10.3 mg/dL   Total Protein 8.3 (H) 6.5 - 8.1 g/dL   Albumin 4.1 3.5 - 5.0 g/dL   AST 31 15 - 41 U/L   ALT 15 0 - 44 U/L   Alkaline Phosphatase 95 38 - 126 U/L   Total Bilirubin 1.5 (H) 0.3 - 1.2 mg/dL   GFR, Estimated 58 (L) >60 mL/min   Anion gap 15 5 - 15  Lactic acid, plasma     Status: Abnormal   Collection Time: 07/14/21 12:19 PM  Result Value Ref Range   Lactic Acid, Venous 7.1 (HH) 0.5 - 1.9 mmol/L  CBC with Differential     Status: Abnormal   Collection Time: 07/14/21 12:19 PM  Result Value  Ref Range   WBC 7.2 4.0 - 10.5 K/uL   RBC 5.39 (H) 3.87 - 5.11 MIL/uL   Hemoglobin 15.4 (H) 12.0 - 15.0 g/dL   HCT 45.0 36.0 - 46.0 %   MCV 83.5 80.0 - 100.0 fL   MCH 28.6 26.0 - 34.0 pg   MCHC 34.2 30.0 - 36.0 g/dL   RDW 12.6 11.5 - 15.5 %   Platelets 237 150 - 400 K/uL   nRBC 0.0 0.0 - 0.2 %   Neutrophils Relative % 94 %   Neutro Abs 6.7 1.7 - 7.7 K/uL   Lymphocytes Relative 5 %   Lymphs Abs 0.3 (L) 0.7 - 4.0 K/uL   Monocytes Relative 0 %   Monocytes Absolute 0.0 (L)  0.1 - 1.0 K/uL   Eosinophils Relative 0 %   Eosinophils Absolute 0.0 0.0 - 0.5 K/uL   Basophils Relative 0 %   Basophils Absolute 0.0 0.0 - 0.1 K/uL   WBC Morphology MORPHOLOGY UNREMARKABLE    RBC Morphology MORPHOLOGY UNREMARKABLE    Smear Review Normal platelet morphology    Immature Granulocytes 1 %   Abs Immature Granulocytes 0.04 0.00 - 0.07 K/uL  Protime-INR     Status: None   Collection Time: 07/14/21 12:19 PM  Result Value Ref Range   Prothrombin Time 14.0 11.4 - 15.2 seconds   INR 1.1 0.8 - 1.2  Urinalysis, Routine w reflex microscopic Nasopharyngeal Swab     Status: Abnormal   Collection Time: 07/14/21 12:19 PM  Result Value Ref Range   Color, Urine YELLOW (A) YELLOW   APPearance HAZY (A) CLEAR   Specific Gravity, Urine 1.023 1.005 - 1.030   pH 5.0 5.0 - 8.0   Glucose, UA >=500 (A) NEGATIVE mg/dL   Hgb urine dipstick NEGATIVE NEGATIVE   Bilirubin Urine NEGATIVE NEGATIVE   Ketones, ur 5 (A) NEGATIVE mg/dL   Protein, ur 735 (A) NEGATIVE mg/dL   Nitrite NEGATIVE NEGATIVE   Leukocytes,Ua NEGATIVE NEGATIVE   RBC / HPF 0-5 0 - 5 RBC/hpf   WBC, UA 6-10 0 - 5 WBC/hpf   Bacteria, UA MANY (A) NONE SEEN   Squamous Epithelial / LPF 0-5 0 - 5   Mucus PRESENT    ____________________________________________ ____________________________________________  RADIOLOGY  I personally reviewed all radiographic images ordered to evaluate for the above acute complaints and reviewed radiology reports and  findings.  These findings were personally discussed with the patient.  Please see medical record for radiology report.  ____________________________________________   PROCEDURES  Procedure(s) performed:  .Critical Care Performed by: Willy Eddy, MD Authorized by: Willy Eddy, MD   Critical care provider statement:    Critical care time (minutes):  35   Critical care was necessary to treat or prevent imminent or life-threatening deterioration of the following conditions:  Sepsis   Critical care was time spent personally by me on the following activities:  Ordering and performing treatments and interventions, ordering and review of laboratory studies, ordering and review of radiographic studies, pulse oximetry, re-evaluation of patient's condition, review of old charts, obtaining history from patient or surrogate, examination of patient, evaluation of patient's response to treatment, discussions with primary provider, discussions with consultants and development of treatment plan with patient or surrogate    Critical Care performed: yes ____________________________________________   INITIAL IMPRESSION / ASSESSMENT AND PLAN / ED COURSE  Pertinent labs & imaging results that were available during my care of the patient were reviewed by me and considered in my medical decision making (see chart for details).   DDX: sepsis, appendicitis, stone, pyelo, colitis  Nicole Villegas is a 55 y.o. who presents to the ED with abdominal pain as described above.  Tachycardia lactic acidemia.  CT imaging will be ordered for above differential.  Clinical Course as of 07/14/21 1429  Tue Jul 14, 2021  1355 CT imaging on my review appears consistent with acute appendicitis.  Will consult general surgery. [PR]  1422 CT does show evidence of acute appendicitis.  We will repeat lactate as she clinically appears quite well to have a lactate of 7.  We will continue with IV fluids.  Dr. Tonna Boehringer will  evaluate patient at bedside. [PR]    Clinical Course User Index [PR] Willy Eddy, MD  The patient was evaluated in Emergency Department today for the symptoms described in the history of present illness. He/she was evaluated in the context of the global COVID-19 pandemic, which necessitated consideration that the patient might be at risk for infection with the SARS-CoV-2 virus that causes COVID-19. Institutional protocols and algorithms that pertain to the evaluation of patients at risk for COVID-19 are in a state of rapid change based on information released by regulatory bodies including the CDC and federal and state organizations. These policies and algorithms were followed during the patient's care in the ED.  As part of my medical decision making, I reviewed the following data within the [place] notes reviewed and incorporated, Labs reviewed, notes from prior ED visits and [place] Controlled Substance Database   ____________________________________________   FINAL CLINICAL IMPRESSION(S) / ED DIAGNOSES  Final diagnoses:  Acute appendicitis with localized peritonitis, unspecified whether abscess present, unspecified whether gangrene present, unspecified whether perforation present      NEW MEDICATIONS STARTED DURING THIS VISIT:  New Prescriptions   No medications on file     Note:  This document was prepared using Dragon voice recognition software and may include unintentional dictation errors.    Merlyn Lot, MD 07/14/21 [phone]

## 2021-07-14 NOTE — Progress Notes (Signed)
CODE SEPSIS - PHARMACY COMMUNICATION  **Broad Spectrum Antibiotics should be administered within 1 hour of Sepsis diagnosis**  Time Code Sepsis Called/Page Received: 1312  Antibiotics Ordered: ceftriaxone and metronidazole  Time of 1st antibiotic administration: 1407    Raiford Noble ,PharmD Clinical Pharmacist  07/14/2021  1:24 PM

## 2021-07-14 NOTE — Anesthesia Preprocedure Evaluation (Addendum)
Anesthesia Evaluation  Patient identified by MRN, date of birth, ID band Patient awake  General Assessment Comment:  Pt with acute appendicitis, arrived to hospital hyperglycemic with sugars in the 500s. Subsequently fluid hydrated and insulin administered, now in the 100s, no DKA.  Reviewed: Allergy & Precautions, H&P , NPO status , Patient's Chart, lab work & pertinent test results, reviewed documented beta blocker date and time   History of Anesthesia Complications Negative for: history of anesthetic complications  Airway Mallampati: III  TM Distance: >3 FB Neck ROM: full    Dental no notable dental hx. (+) Teeth Intact   Pulmonary neg pulmonary ROS, neg sleep apnea, neg COPD, Patient abstained from smoking.Not current smoker,    Pulmonary exam normal breath sounds clear to auscultation       Cardiovascular Exercise Tolerance: Good METShypertension, Pt. on medications (-) CAD and (-) Past MI Normal cardiovascular exam(-) dysrhythmias  Rhythm:regular Rate:Normal - Systolic murmurs    Neuro/Psych PSYCHIATRIC DISORDERS Depression negative neurological ROS     GI/Hepatic Neg liver ROS, GERD  Controlled,  Endo/Other  diabetes, Poorly Controlled, Type 2, Insulin Dependent  Renal/GU CRFRenal disease  negative genitourinary   Musculoskeletal   Abdominal   Peds  Hematology negative hematology ROS (+)   Anesthesia Other Findings Past Medical History: No date: Diabetes mellitus without complication (HCC) No date: Hyperlipidemia No date: Hypertension Past Surgical History: No date: CESAREAN SECTION     Comment:  x3 BMI    Body Mass Index: 30.62 kg/m     Reproductive/Obstetrics negative OB ROS                           Anesthesia Physical Anesthesia Plan  ASA: 3  Anesthesia Plan: General   Post-op Pain Management: Gabapentin PO (pre-op), Tylenol PO (pre-op) and Toradol IV (intra-op)    Induction: Intravenous and Rapid sequence  PONV Risk Score and Plan: 4 or greater and Ondansetron, Dexamethasone and Midazolam  Airway Management Planned: Oral ETT  Additional Equipment: None  Intra-op Plan:   Post-operative Plan: Extubation in OR  Informed Consent: I have reviewed the patients History and Physical, chart, labs and discussed the procedure including the risks, benefits and alternatives for the proposed anesthesia with the patient or authorized representative who has indicated his/her understanding and acceptance.     Dental Advisory Given and Interpreter used for interveiw (bedside spanish interpreter)  Plan Discussed with: CRNA  Anesthesia Plan Comments: (Discussed risks of anesthesia with patient, including PONV, sore throat, lip/dental/eye damage. Rare risks discussed as well, such as cardiorespiratory and neurological sequelae, and allergic reactions. Discussed the role of CRNA in patient's perioperative care. Patient understands.)       Anesthesia Quick Evaluation

## 2021-07-14 NOTE — H&P (View-Only) (Signed)
Subjective:  CC: Acute appendicitis  HPI:  Nicole Villegas is a 55 y.o. female who is consulted by Quentin Cornwall for evaluation of  above cc.  Symptoms were first noted 2 days ago. Pain is sharp associated with nausea emesis x1 , exacerbated by nothing specific.  Also complains of dysuria which is a chronic issue, requiring antibiotic use in the past.     Past Medical History:  has a past medical history of Diabetes mellitus without complication ([place]), Hyperlipidemia, and Hypertension.  Past Surgical History:  has a past surgical history that includes Cesarean section.  Family History: family history is not on file.  Social History:  reports that she has never smoked. She has never used smokeless tobacco. She reports that she does not currently use alcohol. She reports that she does not currently use drugs.  Current Medications:  Prior to Admission medications  Medication Sig Start Date End Date Taking? Authorizing Provider aspirin EC 81 MG EC tablet Take 1 tablet (81 mg total) by mouth daily. Swallow whole. 02/01/20   Hosie Poisson, MD atenolol (TENORMIN) 50 MG tablet Take 50 mg by mouth daily.    [provider] atorvastatin (LIPITOR) 80 MG tablet Take 1 tablet (80 mg total) by mouth daily. 01/31/20   Hosie Poisson, MD cyclobenzaprine (FLEXERIL) 5 MG tablet Take 5 mg by mouth at bedtime.     [provider] fexofenadine (ALLEGRA) 60 MG tablet Take 60 mg by mouth 2 (two) times daily as needed for allergies.     [provider] fluticasone (FLONASE) 50 MCG/ACT nasal spray Place 2 sprays into both nostrils daily as needed for allergies.     [provider] gabapentin (NEURONTIN) 300 MG capsule Take 300 mg by mouth 2 (two) times daily.    [provider] GLIPIZIDE XL 10 MG 24 hr tablet Take 10 mg by mouth 2 (two) times daily.    [provider] insulin aspart (NOVOLOG) 100 UNIT/ML FlexPen Inject 39 Units into the skin See admin instructions.  Inject 39 units twice a day before lunch and dinner.    [provider] insulin detemir (LEVEMIR) 100 UNIT/ML injection Inject 50 Units into the skin at bedtime.     [provider] lisinopril-hydrochlorothiazide (PRINZIDE,ZESTORETIC) 20-12.5 MG tablet Take 2 tablets by mouth daily.     [provider] pantoprazole (PROTONIX) 40 MG tablet Take 1 tablet (40 mg total) by mouth daily. 01/31/20 03/31/20  Hosie Poisson, MD sitaGLIPtin-metformin (JANUMET) 50-1000 MG tablet Take 1 tablet by mouth 2 (two) times daily with a meal.    [provider]   Allergies:  Allergies as of 07/14/2021  (No Known Allergies)   ROS:  General: Denies weight loss, weight gain, fatigue, fevers, chills, and night sweats. Eyes: Denies blurry vision, double vision, eye pain, itchy eyes, and tearing. Ears: Denies hearing loss, earache, and ringing in ears. Nose: Denies sinus pain, congestion, infections, runny nose, and nosebleeds. Mouth/throat: Denies hoarseness, sore throat, bleeding gums, and difficulty swallowing. Heart: Denies chest pain, palpitations, racing heart, irregular heartbeat, leg pain or swelling, and decreased activity tolerance. Respiratory: Denies breathing difficulty, shortness of breath, wheezing, cough, and sputum. GI: Denies change in appetite, heartburn, constipation, diarrhea, and blood in stool. GU: Denies difficulty urinating, urgency, frequency, blood in urine. Musculoskeletal: Denies joint stiffness, pain, swelling, muscle weakness. Skin: Denies rash, itching, mass, tumors, sores, and boils Neurologic: Denies headache, fainting, dizziness, seizures, numbness, and tingling. Psychiatric: Denies depression, anxiety, difficulty sleeping, and memory loss. Endocrine: Denies heat  or cold intolerance, and increased thirst or urination. Blood/lymph: Denies easy bruising, easy bruising, and swollen glands     Objective:    BP 126/81 (BP Location: Left Arm)     Pulse (!) 123    Temp 99.4 F (37.4 C) (Oral)    Resp 18    Ht 5\' 1"  (1.549 m)    Wt 73.5 kg    SpO2 100%    BMI 30.62 kg/m    Constitutional :  alert, cooperative, appears stated age, and no distress Lymphatics/Throat:  no asymmetry, masses, or scars Respiratory:  clear to auscultation bilaterally Cardiovascular:  Tachycardic, regular rhythm Gastrointestinal: Soft, no guarding, focal tenderness to palpation in right lower quadrant .  Musculoskeletal: Steady gait and movement Skin: Cool and moist, no surgical scars Psychiatric: Normal affect, non-agitated, not confused     LABS:  CMP Latest Ref Rng & Units 07/14/2021 01/30/2020 01/30/2020 Glucose 70 - 99 mg/dL 02/01/2020) - 619(JK) BUN 6 - 20 mg/dL 20 - 932(I) Creatinine 71(I - 1.00 mg/dL 4.58) 0.99(I) 3.38(S) Sodium 135 - 145 mmol/L 126(L) - 136 Potassium 3.5 - 5.1 mmol/L 4.1 - 4.8 Chloride 98 - 111 mmol/L 88(L) - 103 CO2 22 - 32 mmol/L 23 - 24 Calcium 8.9 - 10.3 mg/dL 9.5 - 9.2 Total Protein 6.5 - 8.1 g/dL 8.3(H) - - Total Bilirubin 0.3 - 1.2 mg/dL 5.05(L) - - Alkaline Phos 38 - 126 U/L 95 - - AST 15 - 41 U/L 31 - - ALT 0 - 44 U/L 15 - -  CBC Latest Ref Rng & Units 07/14/2021 01/30/2020 01/30/2020 WBC 4.0 - 10.5 K/uL 7.2 9.3 9.2 Hemoglobin 12.0 - 15.0 g/dL 15.4(H) 14.1 13.7 Hematocrit 36.0 - 46.0 % 45.0 42.2 41.2 Platelets 150 - 400 K/uL 237 275 284    RADS: CLINICAL DATA:  Abdominal pain, acute, nonlocalized   EXAM: CT ABDOMEN AND PELVIS WITH CONTRAST   TECHNIQUE: Multidetector CT imaging of the abdomen and pelvis was performed using the standard protocol following bolus administration of intravenous contrast.   CONTRAST:  02/01/2020 OMNIPAQUE IOHEXOL 300 MG/ML  SOLN   COMPARISON:  None.   FINDINGS: Motion artifact is present.   Lower chest: No acute abnormality.   Hepatobiliary: No focal liver abnormality is seen. No gallstones, gallbladder wall thickening, or biliary dilatation.   Pancreas: Unremarkable.    Spleen: Unremarkable.   Adrenals/Urinary Tract: Adrenals, kidneys, and partially distended bladder are unremarkable.   Stomach/Bowel: Stomach is within normal limits. Bowel is normal in caliber. Fluid-filled, dilated appendix with surrounding fat infiltration.   Vascular/Lymphatic: Atherosclerosis.  No enlarged lymph nodes.   Reproductive: Uterus and bilateral adnexa are unremarkable.   Other: No free air.  No abscess.   Musculoskeletal: Lumbar spine degenerative changes.   IMPRESSION: Acute appendicitis without complication.   These results were called by telephone at the time of interpretation on 07/14/2021 at 2:07 pm to provider CARI TRIPLETT , who verbally acknowledged these results.     Electronically Signed   By: 07/16/2021 M.D.   On: 07/14/2021 14:15 Assessment:     Acute appendicitis  Plan:     Discussed the risk of surgery including post-op infxn, seroma, hematoma, abscess formation, chronic pain, poor-delayed wound healing, possible bowel resection, possible ostomy, possible conversion to open procedure, post-op SBO or ileus, and need for additional procedures to address said risks.  The risks of general anesthetic including MI, CVA, sudden death or even reaction to anesthetic medications also discussed. Alternatives include continued observation, or  antibiotic treatment.  Benefits include possible symptom relief,   Typical post operative recovery of 3-5 days rest, also discussed.  The patient understands the risks, any and all questions were answered to the patient's satisfaction.  DM- uncontrolled.  Hospitalist will be admitting to monitor periop.  Surgery will be delayed for now per anesthesia until glucose levels lower.  Will monitor overnight and see if amenable to go as soon as glucose levels improve. NPO and continue abx from appendicitis standpoint in the meantime.

## 2021-07-14 NOTE — ED Triage Notes (Signed)
Pt here with N/V and abd pain for 2 days. Pt states that pain is RLQ and radiates across her abd. Pt in NAD in triage.

## 2021-07-14 NOTE — Progress Notes (Signed)
Pharmacy Antibiotic Note  Nicole Villegas is a 55 y.o. female admitted on 07/14/2021 with UTI and appendicitis.  Pharmacy has been consulted for Zosyn dosing.  Plan: Zosyn 3.375g IV q8h (4 hour infusion). Monitor renal function and adjust dose as clinically indicated  Height: 5\' 1"  (154.9 cm) Weight: 73.5 kg (162 lb 0.6 oz) IBW/kg (Calculated) : 47.8  Temp (24hrs), Avg:99.4 F (37.4 C), Min:99.4 F (37.4 C), Max:99.4 F (37.4 C)  Recent Labs  Lab 07/14/21 1219  WBC 7.2  CREATININE 1.12*  LATICACIDVEN 7.1*    Estimated Creatinine Clearance: 52.1 mL/min (A) (by C-G formula based on SCr of 1.12 mg/dL (H)).    No Known Allergies  Antimicrobials this admission: 12/27 ceftriaxone/metronidazole x 1 12/27 Zosyn >>    Microbiology results: 12/27 BCx: sent  Thank you for allowing pharmacy to be a part of this patients care.  1/28 Nicole Villegas 07/14/2021 4:52 PM

## 2021-07-14 NOTE — ED Triage Notes (Signed)
Presents with abd pain with n/v/d for 2 days

## 2021-07-14 NOTE — Consult Note (Signed)
Subjective:  CC: Acute appendicitis  HPI:  Nicole Villegas is a 55 y.o. female who is consulted by Quentin Cornwall for evaluation of  above cc.  Symptoms were first noted 2 days ago. Pain is sharp associated with nausea emesis x1 , exacerbated by nothing specific.  Also complains of dysuria which is a chronic issue, requiring antibiotic use in the past.     Past Medical History:  has a past medical history of Diabetes mellitus without complication ([place]), Hyperlipidemia, and Hypertension.  Past Surgical History:  has a past surgical history that includes Cesarean section.  Family History: family history is not on file.  Social History:  reports that she has never smoked. She has never used smokeless tobacco. She reports that she does not currently use alcohol. She reports that she does not currently use drugs.  Current Medications:  Prior to Admission medications  Medication Sig Start Date End Date Taking? Authorizing Provider aspirin EC 81 MG EC tablet Take 1 tablet (81 mg total) by mouth daily. Swallow whole. 02/01/20   Hosie Poisson, MD atenolol (TENORMIN) 50 MG tablet Take 50 mg by mouth daily.    [provider] atorvastatin (LIPITOR) 80 MG tablet Take 1 tablet (80 mg total) by mouth daily. 01/31/20   Hosie Poisson, MD cyclobenzaprine (FLEXERIL) 5 MG tablet Take 5 mg by mouth at bedtime.     [provider] fexofenadine (ALLEGRA) 60 MG tablet Take 60 mg by mouth 2 (two) times daily as needed for allergies.     [provider] fluticasone (FLONASE) 50 MCG/ACT nasal spray Place 2 sprays into both nostrils daily as needed for allergies.     [provider] gabapentin (NEURONTIN) 300 MG capsule Take 300 mg by mouth 2 (two) times daily.    [provider] GLIPIZIDE XL 10 MG 24 hr tablet Take 10 mg by mouth 2 (two) times daily.    [provider] insulin aspart (NOVOLOG) 100 UNIT/ML FlexPen Inject 39 Units into the skin See admin instructions.  Inject 39 units twice a day before lunch and dinner.    [provider] insulin detemir (LEVEMIR) 100 UNIT/ML injection Inject 50 Units into the skin at bedtime.     [provider] lisinopril-hydrochlorothiazide (PRINZIDE,ZESTORETIC) 20-12.5 MG tablet Take 2 tablets by mouth daily.     [provider] pantoprazole (PROTONIX) 40 MG tablet Take 1 tablet (40 mg total) by mouth daily. 01/31/20 03/31/20  Hosie Poisson, MD sitaGLIPtin-metformin (JANUMET) 50-1000 MG tablet Take 1 tablet by mouth 2 (two) times daily with a meal.    [provider]   Allergies:  Allergies as of 07/14/2021  (No Known Allergies)   ROS:  General: Denies weight loss, weight gain, fatigue, fevers, chills, and night sweats. Eyes: Denies blurry vision, double vision, eye pain, itchy eyes, and tearing. Ears: Denies hearing loss, earache, and ringing in ears. Nose: Denies sinus pain, congestion, infections, runny nose, and nosebleeds. Mouth/throat: Denies hoarseness, sore throat, bleeding gums, and difficulty swallowing. Heart: Denies chest pain, palpitations, racing heart, irregular heartbeat, leg pain or swelling, and decreased activity tolerance. Respiratory: Denies breathing difficulty, shortness of breath, wheezing, cough, and sputum. GI: Denies change in appetite, heartburn, constipation, diarrhea, and blood in stool. GU: Denies difficulty urinating, urgency, frequency, blood in urine. Musculoskeletal: Denies joint stiffness, pain, swelling, muscle weakness. Skin: Denies rash, itching, mass, tumors, sores, and boils Neurologic: Denies headache, fainting, dizziness, seizures, numbness, and tingling. Psychiatric: Denies depression, anxiety, difficulty sleeping, and memory loss. Endocrine: Denies heat  or cold intolerance, and increased thirst or urination. Blood/lymph: Denies easy bruising, easy bruising, and swollen glands     Objective:    BP 126/81 (BP Location: Left Arm)     Pulse (!) 123    Temp 99.4 F (37.4 C) (Oral)    Resp 18    Ht 5\' 1"  (1.549 m)    Wt 73.5 kg    SpO2 100%    BMI 30.62 kg/m    Constitutional :  alert, cooperative, appears stated age, and no distress Lymphatics/Throat:  no asymmetry, masses, or scars Respiratory:  clear to auscultation bilaterally Cardiovascular:  Tachycardic, regular rhythm Gastrointestinal: Soft, no guarding, focal tenderness to palpation in right lower quadrant .  Musculoskeletal: Steady gait and movement Skin: Cool and moist, no surgical scars Psychiatric: Normal affect, non-agitated, not confused     LABS:  CMP Latest Ref Rng & Units 07/14/2021 01/30/2020 01/30/2020 Glucose 70 - 99 mg/dL 02/01/2020) - 619(JK) BUN 6 - 20 mg/dL 20 - 932(I) Creatinine 71(I - 1.00 mg/dL 4.58) 0.99(I) 3.38(S) Sodium 135 - 145 mmol/L 126(L) - 136 Potassium 3.5 - 5.1 mmol/L 4.1 - 4.8 Chloride 98 - 111 mmol/L 88(L) - 103 CO2 22 - 32 mmol/L 23 - 24 Calcium 8.9 - 10.3 mg/dL 9.5 - 9.2 Total Protein 6.5 - 8.1 g/dL 8.3(H) - - Total Bilirubin 0.3 - 1.2 mg/dL 5.05(L) - - Alkaline Phos 38 - 126 U/L 95 - - AST 15 - 41 U/L 31 - - ALT 0 - 44 U/L 15 - -  CBC Latest Ref Rng & Units 07/14/2021 01/30/2020 01/30/2020 WBC 4.0 - 10.5 K/uL 7.2 9.3 9.2 Hemoglobin 12.0 - 15.0 g/dL 15.4(H) 14.1 13.7 Hematocrit 36.0 - 46.0 % 45.0 42.2 41.2 Platelets 150 - 400 K/uL 237 275 284    RADS: CLINICAL DATA:  Abdominal pain, acute, nonlocalized   EXAM: CT ABDOMEN AND PELVIS WITH CONTRAST   TECHNIQUE: Multidetector CT imaging of the abdomen and pelvis was performed using the standard protocol following bolus administration of intravenous contrast.   CONTRAST:  02/01/2020 OMNIPAQUE IOHEXOL 300 MG/ML  SOLN   COMPARISON:  None.   FINDINGS: Motion artifact is present.   Lower chest: No acute abnormality.   Hepatobiliary: No focal liver abnormality is seen. No gallstones, gallbladder wall thickening, or biliary dilatation.   Pancreas: Unremarkable.    Spleen: Unremarkable.   Adrenals/Urinary Tract: Adrenals, kidneys, and partially distended bladder are unremarkable.   Stomach/Bowel: Stomach is within normal limits. Bowel is normal in caliber. Fluid-filled, dilated appendix with surrounding fat infiltration.   Vascular/Lymphatic: Atherosclerosis.  No enlarged lymph nodes.   Reproductive: Uterus and bilateral adnexa are unremarkable.   Other: No free air.  No abscess.   Musculoskeletal: Lumbar spine degenerative changes.   IMPRESSION: Acute appendicitis without complication.   These results were called by telephone at the time of interpretation on 07/14/2021 at 2:07 pm to provider CARI TRIPLETT , who verbally acknowledged these results.     Electronically Signed   By: 07/16/2021 M.D.   On: 07/14/2021 14:15 Assessment:     Acute appendicitis  Plan:     Discussed the risk of surgery including post-op infxn, seroma, hematoma, abscess formation, chronic pain, poor-delayed wound healing, possible bowel resection, possible ostomy, possible conversion to open procedure, post-op SBO or ileus, and need for additional procedures to address said risks.  The risks of general anesthetic including MI, CVA, sudden death or even reaction to anesthetic medications also discussed. Alternatives include continued observation, or  antibiotic treatment.  Benefits include possible symptom relief,   Typical post operative recovery of 3-5 days rest, also discussed.  The patient understands the risks, any and all questions were answered to the patient's satisfaction.  DM- uncontrolled.  Hospitalist will be admitting to monitor periop.  Surgery will be delayed for now per anesthesia until glucose levels lower.  Will monitor overnight and see if amenable to go as soon as glucose levels improve. NPO and continue abx from appendicitis standpoint in the meantime.

## 2021-07-14 NOTE — Sepsis Progress Note (Signed)
ELink monitoring sepsis protocol 

## 2021-07-14 NOTE — ED Provider Notes (Signed)
Emergency Medicine Provider Triage Evaluation Note  Nicole Villegas , a 55 y.o. female  was evaluated in triage.  Pt complains of abdominal pain x 2 days with nausea and vomiting and fever.  Review of Systems  Positive: Nausea, vomiting, fever. Negative: Chest pain, shortness of breath  Physical Exam  There were no vitals taken for this visit. Gen:   Awake, no distress   Resp:  Normal effort  MSK:   Moves extremities without difficulty  Other:   Medical Decision Making  Medically screening exam initiated at 12:05 PM.  Appropriate orders placed.  Nicole Villegas was informed that the remainder of the evaluation will be completed by another provider, this initial triage assessment does not replace that evaluation, and the importance of remaining in the ED until their evaluation is complete.    Nicole Pester, FNP 07/14/21 1207    [place]ton [place], MD 07/15/21 [phone]

## 2021-07-14 NOTE — H&P (Deleted)
Subjective:   CC: Acute appendicitis  HPI:  Nicole Villegas is a 55 y.o. female who is consulted by Quentin Cornwall for evaluation of  above cc.  Symptoms were first noted 2 days ago. Pain is sharp associated with nausea emesis x1 , exacerbated by nothing specific.  Also complains of dysuria which is a chronic issue, requiring antibiotic use in the past.     Past Medical History:  has a past medical history of Diabetes mellitus without complication ([place]), Hyperlipidemia, and Hypertension.  Past Surgical History:  has a past surgical history that includes Cesarean section.  Family History: family history is not on file.  Social History:  reports that she has never smoked. She has never used smokeless tobacco. She reports that she does not currently use alcohol. She reports that she does not currently use drugs.  Current Medications:  Prior to Admission medications   Medication Sig Start Date End Date Taking? Authorizing Provider  aspirin EC 81 MG EC tablet Take 1 tablet (81 mg total) by mouth daily. Swallow whole. 02/01/20   Hosie Poisson, MD  atenolol (TENORMIN) 50 MG tablet Take 50 mg by mouth daily.    [provider]  atorvastatin (LIPITOR) 80 MG tablet Take 1 tablet (80 mg total) by mouth daily. 01/31/20   Hosie Poisson, MD  cyclobenzaprine (FLEXERIL) 5 MG tablet Take 5 mg by mouth at bedtime.     [provider]  fexofenadine (ALLEGRA) 60 MG tablet Take 60 mg by mouth 2 (two) times daily as needed for allergies.     [provider]  fluticasone (FLONASE) 50 MCG/ACT nasal spray Place 2 sprays into both nostrils daily as needed for allergies.     [provider]  gabapentin (NEURONTIN) 300 MG capsule Take 300 mg by mouth 2 (two) times daily.    [provider]  GLIPIZIDE XL 10 MG 24 hr tablet Take 10 mg by mouth 2 (two) times daily.    [provider]  insulin aspart (NOVOLOG) 100 UNIT/ML FlexPen Inject 39 Units into the skin See admin  instructions. Inject 39 units twice a day before lunch and dinner.    [provider]  insulin detemir (LEVEMIR) 100 UNIT/ML injection Inject 50 Units into the skin at bedtime.     [provider]  lisinopril-hydrochlorothiazide (PRINZIDE,ZESTORETIC) 20-12.5 MG tablet Take 2 tablets by mouth daily.     [provider]  pantoprazole (PROTONIX) 40 MG tablet Take 1 tablet (40 mg total) by mouth daily. 01/31/20 03/31/20  Hosie Poisson, MD  sitaGLIPtin-metformin (JANUMET) 50-1000 MG tablet Take 1 tablet by mouth 2 (two) times daily with a meal.    [provider]    Allergies:  Allergies as of 07/14/2021   (No Known Allergies)    ROS:  General: Denies weight loss, weight gain, fatigue, fevers, chills, and night sweats. Eyes: Denies blurry vision, double vision, eye pain, itchy eyes, and tearing. Ears: Denies hearing loss, earache, and ringing in ears. Nose: Denies sinus pain, congestion, infections, runny nose, and nosebleeds. Mouth/throat: Denies hoarseness, sore throat, bleeding gums, and difficulty swallowing. Heart: Denies chest pain, palpitations, racing heart, irregular heartbeat, leg pain or swelling, and decreased activity tolerance. Respiratory: Denies breathing difficulty, shortness of breath, wheezing, cough, and sputum. GI: Denies change in appetite, heartburn, constipation, diarrhea, and blood in stool. GU: Denies difficulty urinating, urgency, frequency, blood in urine. Musculoskeletal: Denies joint stiffness, pain, swelling, muscle weakness. Skin: Denies rash, itching, mass, tumors, sores, and boils Neurologic: Denies headache,  fainting, dizziness, seizures, numbness, and tingling. Psychiatric: Denies depression, anxiety, difficulty sleeping, and memory loss. Endocrine: Denies heat or cold intolerance, and increased thirst or urination. Blood/lymph: Denies easy bruising, easy bruising, and swollen glands     Objective:     BP 126/81 (BP  Location: Left Arm)    Pulse (!) 123    Temp 99.4 F (37.4 C) (Oral)    Resp 18    Ht 5\' 1"  (1.549 m)    Wt 73.5 kg    SpO2 100%    BMI 30.62 kg/m    Constitutional :  alert, cooperative, appears stated age, and no distress  Lymphatics/Throat:  no asymmetry, masses, or scars  Respiratory:  clear to auscultation bilaterally  Cardiovascular:  Tachycardic, regular rhythm  Gastrointestinal: Soft, no guarding, focal tenderness to palpation in right lower quadrant .   Musculoskeletal: Steady gait and movement  Skin: Cool and moist, no surgical scars  Psychiatric: Normal affect, non-agitated, not confused       LABS:  CMP Latest Ref Rng & Units 07/14/2021 01/30/2020 01/30/2020  Glucose 70 - 99 mg/dL 522(HH) - 126(H)  BUN 6 - 20 mg/dL 20 - 38(H)  Creatinine 0.44 - 1.00 mg/dL 1.12(H) 1.72(H) 1.74(H)  Sodium 135 - 145 mmol/L 126(L) - 136  Potassium 3.5 - 5.1 mmol/L 4.1 - 4.8  Chloride 98 - 111 mmol/L 88(L) - 103  CO2 22 - 32 mmol/L 23 - 24  Calcium 8.9 - 10.3 mg/dL 9.5 - 9.2  Total Protein 6.5 - 8.1 g/dL 8.3(H) - -  Total Bilirubin 0.3 - 1.2 mg/dL 1.5(H) - -  Alkaline Phos 38 - 126 U/L 95 - -  AST 15 - 41 U/L 31 - -  ALT 0 - 44 U/L 15 - -   CBC Latest Ref Rng & Units 07/14/2021 01/30/2020 01/30/2020  WBC 4.0 - 10.5 K/uL 7.2 9.3 9.2  Hemoglobin 12.0 - 15.0 g/dL 15.4(H) 14.1 13.7  Hematocrit 36.0 - 46.0 % 45.0 42.2 41.2  Platelets 150 - 400 K/uL 237 275 284     RADS: CLINICAL DATA:  Abdominal pain, acute, nonlocalized   EXAM: CT ABDOMEN AND PELVIS WITH CONTRAST   TECHNIQUE: Multidetector CT imaging of the abdomen and pelvis was performed using the standard protocol following bolus administration of intravenous contrast.   CONTRAST:  141mL OMNIPAQUE IOHEXOL 300 MG/ML  SOLN   COMPARISON:  None.   FINDINGS: Motion artifact is present.   Lower chest: No acute abnormality.   Hepatobiliary: No focal liver abnormality is seen. No gallstones, gallbladder wall thickening, or  biliary dilatation.   Pancreas: Unremarkable.   Spleen: Unremarkable.   Adrenals/Urinary Tract: Adrenals, kidneys, and partially distended bladder are unremarkable.   Stomach/Bowel: Stomach is within normal limits. Bowel is normal in caliber. Fluid-filled, dilated appendix with surrounding fat infiltration.   Vascular/Lymphatic: Atherosclerosis.  No enlarged lymph nodes.   Reproductive: Uterus and bilateral adnexa are unremarkable.   Other: No free air.  No abscess.   Musculoskeletal: Lumbar spine degenerative changes.   IMPRESSION: Acute appendicitis without complication.   These results were called by telephone at the time of interpretation on 07/14/2021 at 2:07 pm to provider CARI TRIPLETT , who verbally acknowledged these results.     Electronically Signed   By: Macy Mis M.D.   On: 07/14/2021 14:15 Assessment:      Acute appendicitis  Plan:      Discussed the risk of surgery including post-op infxn, seroma, hematoma, abscess formation, chronic pain, poor-delayed  wound healing, possible bowel resection, possible ostomy, possible conversion to open procedure, post-op SBO or ileus, and need for additional procedures to address said risks.  The risks of general anesthetic including MI, CVA, sudden death or even reaction to anesthetic medications also discussed. Alternatives include continued observation, or antibiotic treatment.  Benefits include possible symptom relief,   Typical post operative recovery of 3-5 days rest, also discussed.  The patient understands the risks, any and all questions were answered to the patient's satisfaction.  DM- uncontrolled will monitor periop, likely hospitalist consult to co-manage.

## 2021-07-15 ENCOUNTER — Inpatient Hospital Stay: Payer: BC Managed Care – PPO | Admitting: Anesthesiology

## 2021-07-15 ENCOUNTER — Encounter: Payer: Self-pay | Admitting: Internal Medicine

## 2021-07-15 ENCOUNTER — Encounter
Admission: EM | Disposition: A | Discharge status: Home / Self Care | Payer: Self-pay | Source: Home / Self Care | Attending: Internal Medicine

## 2021-07-15 DIAGNOSIS — N390 Urinary tract infection, site not specified: Secondary | ICD-10-CM

## 2021-07-15 DIAGNOSIS — R7881 Bacteremia: Secondary | ICD-10-CM

## 2021-07-15 HISTORY — PX: XI ROBOTIC LAPAROSCOPIC ASSISTED APPENDECTOMY: SHX6877

## 2021-07-15 LAB — BASIC METABOLIC PANEL
Anion gap: 8 (ref 5–15)
BUN: 19 mg/dL (ref 6–20)
CO2: 24 mmol/L (ref 22–32)
Calcium: 8.4 mg/dL — ABNORMAL LOW (ref 8.9–10.3)
Chloride: 103 mmol/L (ref 98–111)
Creatinine, Ser: 0.79 mg/dL (ref 0.44–1.00)
GFR, Estimated: >60 mL/min (ref >60)
Glucose, Bld: 197 mg/dL — ABNORMAL HIGH (ref 70–99)
Potassium: 3.5 mmol/L (ref 3.5–5.1)
Sodium: 135 mmol/L (ref 135–145)

## 2021-07-15 LAB — BLOOD CULTURE ID PANEL (REFLEXED) - BCID2
A.calcoaceticus-baumannii: NOT DETECTED
Bacteroides fragilis: DETECTED — AB
CTX-M ESBL: NOT DETECTED
Candida albicans: NOT DETECTED
Candida auris: NOT DETECTED
Candida glabrata: NOT DETECTED
Candida krusei: NOT DETECTED
Candida parapsilosis: NOT DETECTED
Candida tropicalis: NOT DETECTED
Carbapenem resist OXA 48 LIKE: NOT DETECTED
Carbapenem resistance IMP: NOT DETECTED
Carbapenem resistance KPC: NOT DETECTED
Carbapenem resistance NDM: NOT DETECTED
Carbapenem resistance VIM: NOT DETECTED
Cryptococcus neoformans/gattii: NOT DETECTED
Enterobacter cloacae complex: NOT DETECTED
Enterobacterales: DETECTED — AB
Enterococcus Faecium: NOT DETECTED
Enterococcus faecalis: NOT DETECTED
Escherichia coli: DETECTED — AB
Haemophilus influenzae: NOT DETECTED
Klebsiella aerogenes: NOT DETECTED
Klebsiella oxytoca: NOT DETECTED
Klebsiella pneumoniae: NOT DETECTED
Listeria monocytogenes: NOT DETECTED
Neisseria meningitidis: NOT DETECTED
Proteus species: NOT DETECTED
Pseudomonas aeruginosa: NOT DETECTED
Salmonella species: NOT DETECTED
Serratia marcescens: NOT DETECTED
Staphylococcus aureus (BCID): NOT DETECTED
Staphylococcus epidermidis: NOT DETECTED
Staphylococcus lugdunensis: NOT DETECTED
Staphylococcus species: NOT DETECTED
Stenotrophomonas maltophilia: NOT DETECTED
Streptococcus agalactiae: NOT DETECTED
Streptococcus pneumoniae: NOT DETECTED
Streptococcus pyogenes: NOT DETECTED
Streptococcus species: NOT DETECTED

## 2021-07-15 LAB — GLUCOSE, CAPILLARY
Glucose-Capillary: 176 mg/dL — ABNORMAL HIGH (ref 70–99)
Glucose-Capillary: 159 mg/dL — ABNORMAL HIGH (ref 70–99)
Glucose-Capillary: 176 mg/dL — ABNORMAL HIGH (ref 70–99)
Glucose-Capillary: 177 mg/dL — ABNORMAL HIGH (ref 70–99)
Glucose-Capillary: 269 mg/dL — ABNORMAL HIGH (ref 70–99)

## 2021-07-15 LAB — CBC
HCT: 35 % — ABNORMAL LOW (ref 36.0–46.0)
Hemoglobin: 12.2 g/dL (ref 12.0–15.0)
MCH: 28.8 pg (ref 26.0–34.0)
MCHC: 34.9 g/dL (ref 30.0–36.0)
MCV: 82.7 fL (ref 80.0–100.0)
Platelets: 146 10*3/uL — ABNORMAL LOW (ref 150–400)
RBC: 4.23 MIL/uL (ref 3.87–5.11)
RDW: 12.8 % (ref 11.5–15.5)
WBC: 8.1 10*3/uL (ref 4.0–10.5)
nRBC: 0 % (ref 0.0–0.2)

## 2021-07-15 LAB — PREGNANCY, URINE: Preg Test, Ur: NEGATIVE

## 2021-07-15 LAB — SURGICAL PCR SCREEN
MRSA, PCR: NEGATIVE
Staphylococcus aureus: POSITIVE — AB

## 2021-07-15 SURGERY — APPENDECTOMY, ROBOT-ASSISTED, LAPAROSCOPIC
Anesthesia: General

## 2021-07-15 MED ORDER — LIDOCAINE HCL (PF) 2 % IJ SOLN
INTRAMUSCULAR | Status: AC
  Filled 2021-07-15: qty 5

## 2021-07-15 MED ORDER — DEXAMETHASONE SODIUM PHOSPHATE 10 MG/ML IJ SOLN
INTRAMUSCULAR | Status: AC
  Filled 2021-07-15: qty 1

## 2021-07-15 MED ORDER — SUGAMMADEX SODIUM 200 MG/2ML IV SOLN
INTRAVENOUS | Status: DC | PRN
  Administered 2021-07-15: 200 mg via INTRAVENOUS

## 2021-07-15 MED ORDER — MIDAZOLAM HCL 2 MG/2ML IJ SOLN
INTRAMUSCULAR | Status: AC
  Filled 2021-07-15: qty 2

## 2021-07-15 MED ORDER — SUGAMMADEX SODIUM 500 MG/5ML IV SOLN
INTRAVENOUS | Status: AC
  Filled 2021-07-15: qty 5

## 2021-07-15 MED ORDER — EPHEDRINE SULFATE 50 MG/ML IJ SOLN
INTRAMUSCULAR | Status: DC | PRN
Start: 2021-07-15 — End: 2021-07-15
  Administered 2021-07-15 (×2): 5 mg via INTRAVENOUS

## 2021-07-15 MED ORDER — FENTANYL CITRATE (PF) 100 MCG/2ML IJ SOLN: 25.0000 ug | INTRAMUSCULAR | Status: DC | PRN

## 2021-07-15 MED ORDER — FENTANYL CITRATE (PF) 100 MCG/2ML IJ SOLN
INTRAMUSCULAR | Status: AC
  Filled 2021-07-15: qty 2

## 2021-07-15 MED ORDER — SUCCINYLCHOLINE CHLORIDE 200 MG/10ML IV SOSY
PREFILLED_SYRINGE | INTRAVENOUS | Status: AC
  Filled 2021-07-15: qty 10

## 2021-07-15 MED ORDER — ONDANSETRON HCL 4 MG/2ML IJ SOLN: 4.0000 mg | Freq: Once | INTRAMUSCULAR | Status: DC | PRN

## 2021-07-15 MED ORDER — ONDANSETRON HCL 4 MG/2ML IJ SOLN
INTRAMUSCULAR | Status: DC | PRN
  Administered 2021-07-15: 4 mg via INTRAVENOUS

## 2021-07-15 MED ORDER — MIDAZOLAM HCL 2 MG/2ML IJ SOLN
INTRAMUSCULAR | Status: DC | PRN
  Administered 2021-07-15: 2 mg via INTRAVENOUS

## 2021-07-15 MED ORDER — SUCCINYLCHOLINE CHLORIDE 200 MG/10ML IV SOSY
PREFILLED_SYRINGE | INTRAVENOUS | Status: DC | PRN
  Administered 2021-07-15: 80 mg via INTRAVENOUS

## 2021-07-15 MED ORDER — METRONIDAZOLE 500 MG/100ML IV SOLN
500.0000 mg | Freq: Two times a day (BID) | INTRAVENOUS | Status: DC
  Administered 2021-07-15 – 2021-07-18 (×7): 500 mg via INTRAVENOUS
  Filled 2021-07-15 (×8): qty 100

## 2021-07-15 MED ORDER — FENTANYL CITRATE (PF) 100 MCG/2ML IJ SOLN
INTRAMUSCULAR | Status: DC | PRN
  Administered 2021-07-15 (×2): 50 ug via INTRAVENOUS

## 2021-07-15 MED ORDER — ROCURONIUM BROMIDE 100 MG/10ML IV SOLN
INTRAVENOUS | Status: DC | PRN
Start: 2021-07-15 — End: 2021-07-15
  Administered 2021-07-15: 40 mg via INTRAVENOUS
  Administered 2021-07-15: 10 mg via INTRAVENOUS

## 2021-07-15 MED ORDER — LIVING WELL WITH DIABETES BOOK - IN SPANISH
Freq: Once | Status: DC
  Filled 2021-07-15: qty 1

## 2021-07-15 MED ORDER — ENOXAPARIN SODIUM 40 MG/0.4ML IJ SOSY
40.0000 mg | PREFILLED_SYRINGE | INTRAMUSCULAR | Status: DC
  Administered 2021-07-16 – 2021-07-18 (×3): 40 mg via SUBCUTANEOUS
  Filled 2021-07-15 (×3): qty 0.4

## 2021-07-15 MED ORDER — SODIUM CHLORIDE 0.9 % IV SOLN
2.0000 g | INTRAVENOUS | Status: DC
  Administered 2021-07-15 – 2021-07-18 (×4): 2 g via INTRAVENOUS
  Filled 2021-07-15: qty 2
  Filled 2021-07-15 (×2): qty 20
  Filled 2021-07-15: qty 2

## 2021-07-15 MED ORDER — BUPIVACAINE HCL (PF) 0.5 % IJ SOLN
INTRAMUSCULAR | Status: DC | PRN
  Administered 2021-07-15: 10 mL

## 2021-07-15 MED ORDER — SODIUM CHLORIDE FLUSH 0.9 % IV SOLN
INTRAVENOUS | Status: AC
  Filled 2021-07-15: qty 10

## 2021-07-15 MED ORDER — LIDOCAINE HCL (CARDIAC) PF 100 MG/5ML IV SOSY
PREFILLED_SYRINGE | INTRAVENOUS | Status: DC | PRN
  Administered 2021-07-15: 60 mg via INTRAVENOUS

## 2021-07-15 MED ORDER — PROPOFOL 10 MG/ML IV BOLUS
INTRAVENOUS | Status: AC
  Filled 2021-07-15: qty 20

## 2021-07-15 MED ORDER — OXYCODONE HCL 5 MG PO TABS: 5.0000 mg | ORAL_TABLET | Freq: Once | ORAL | Status: DC | PRN

## 2021-07-15 MED ORDER — DEXAMETHASONE SODIUM PHOSPHATE 10 MG/ML IJ SOLN
INTRAMUSCULAR | Status: DC | PRN
  Administered 2021-07-15: 4 mg via INTRAVENOUS

## 2021-07-15 MED ORDER — KETOROLAC TROMETHAMINE 30 MG/ML IJ SOLN
INTRAMUSCULAR | Status: AC
  Filled 2021-07-15: qty 1

## 2021-07-15 MED ORDER — PROPOFOL 10 MG/ML IV BOLUS
INTRAVENOUS | Status: DC | PRN
  Administered 2021-07-15: 110 mg via INTRAVENOUS

## 2021-07-15 MED ORDER — SODIUM CHLORIDE 0.9 % IR SOLN
Status: DC | PRN
  Administered 2021-07-15: 500 mL

## 2021-07-15 MED ORDER — ACETAMINOPHEN 10 MG/ML IV SOLN: 1000.0000 mg | Freq: Once | INTRAVENOUS | Status: DC | PRN

## 2021-07-15 MED ORDER — OXYCODONE HCL 5 MG/5ML PO SOLN: 5.0000 mg | Freq: Once | ORAL | Status: DC | PRN

## 2021-07-15 MED ORDER — ROCURONIUM BROMIDE 10 MG/ML (PF) SYRINGE
PREFILLED_SYRINGE | INTRAVENOUS | Status: AC
  Filled 2021-07-15: qty 10

## 2021-07-15 MED ORDER — LIDOCAINE-EPINEPHRINE (PF) 1 %-1:200000 IJ SOLN
INTRAMUSCULAR | Status: DC | PRN
  Administered 2021-07-15: 10 mL

## 2021-07-15 MED ORDER — ONDANSETRON HCL 4 MG/2ML IJ SOLN
INTRAMUSCULAR | Status: AC
  Filled 2021-07-15: qty 2

## 2021-07-15 MED ORDER — PHENYLEPHRINE HCL (PRESSORS) 10 MG/ML IV SOLN
INTRAVENOUS | Status: DC | PRN
  Administered 2021-07-15: 80 ug via INTRAVENOUS
  Administered 2021-07-15 (×2): 160 ug via INTRAVENOUS
  Administered 2021-07-15 (×3): 80 ug via INTRAVENOUS
  Administered 2021-07-15: 160 ug via INTRAVENOUS

## 2021-07-15 MED ORDER — GLYCOPYRROLATE 0.2 MG/ML IJ SOLN
INTRAMUSCULAR | Status: DC | PRN
  Administered 2021-07-15: .2 mg via INTRAVENOUS

## 2021-07-15 SURGICAL SUPPLY — 73 items
"PENCIL ELECTRO HAND CTR " (MISCELLANEOUS) ×1 IMPLANT
ANCHOR TIS RET SYS 235ML (MISCELLANEOUS) ×1 IMPLANT
BAG INFUSER PRESSURE 100CC (MISCELLANEOUS) IMPLANT
BLADE SURG SZ11 CARB STEEL (BLADE) ×2 IMPLANT
BULB RESERV EVAC DRAIN JP 100C (MISCELLANEOUS) ×1 IMPLANT
CANNULA REDUC XI 12-8 STAPL (CANNULA) ×1
CANNULA REDUCER 12-8 DVNC XI (CANNULA) ×1 IMPLANT
CHLORAPREP W/TINT 26 (MISCELLANEOUS) ×2 IMPLANT
COVER TIP SHEARS 8 DVNC (MISCELLANEOUS) ×1 IMPLANT
COVER TIP SHEARS 8MM DA VINCI (MISCELLANEOUS) ×1
DEFOGGER SCOPE WARMER CLEARIFY (MISCELLANEOUS) ×2 IMPLANT
DERMABOND ADVANCED (GAUZE/BANDAGES/DRESSINGS) ×1
DERMABOND ADVANCED .7 DNX12 (GAUZE/BANDAGES/DRESSINGS) ×1 IMPLANT
DRAIN CHANNEL JP 15F RND 16 (MISCELLANEOUS) ×1 IMPLANT
DRAPE ARM DVNC X/XI (DISPOSABLE) ×3 IMPLANT
DRAPE COLUMN DVNC XI (DISPOSABLE) ×1 IMPLANT
DRAPE DA VINCI XI ARM (DISPOSABLE) ×3
DRAPE DA VINCI XI COLUMN (DISPOSABLE) ×1
DRSG OPSITE POSTOP 3X4 (GAUZE/BANDAGES/DRESSINGS) ×2 IMPLANT
ELECT CAUTERY BLADE 6.4 (BLADE) IMPLANT
ELECT REM PT RETURN 9FT ADLT (ELECTROSURGICAL) ×2
ELECTRODE REM PT RTRN 9FT ADLT (ELECTROSURGICAL) ×1 IMPLANT
GAUZE 4X4 16PLY ~~LOC~~+RFID DBL (SPONGE) ×2 IMPLANT
GLOVE SURG SYN 6.5 ES PF (GLOVE) ×4 IMPLANT
GLOVE SURG SYN 6.5 PF PI (GLOVE) ×2 IMPLANT
GLOVE SURG UNDER POLY LF SZ7 (GLOVE) ×4 IMPLANT
GOWN STRL REUS W/ TWL LRG LVL3 (GOWN DISPOSABLE) ×3 IMPLANT
GOWN STRL REUS W/TWL LRG LVL3 (GOWN DISPOSABLE) ×3
GRASPER SUT TROCAR 14GX15 (MISCELLANEOUS) IMPLANT
IRRIGATOR SUCT 8 DISP DVNC XI (IRRIGATION / IRRIGATOR) IMPLANT
IRRIGATOR SUCTION 8MM XI DISP (IRRIGATION / IRRIGATOR) ×1
IV NS 1000ML (IV SOLUTION) ×1
IV NS 1000ML BAXH (IV SOLUTION) IMPLANT
JACKSON PRATT 7MM (INSTRUMENTS) IMPLANT
KIT TURNOVER KIT A (KITS) ×2 IMPLANT
LABEL OR SOLS (LABEL) IMPLANT
MANIFOLD NEPTUNE II (INSTRUMENTS) ×2 IMPLANT
NDL INSUFFLATION 14GA 120MM (NEEDLE) ×1 IMPLANT
NEEDLE HYPO 22GX1.5 SAFETY (NEEDLE) ×2 IMPLANT
NEEDLE INSUFFLATION 14GA 120MM (NEEDLE) ×2 IMPLANT
OBTURATOR OPTICAL STANDARD 8MM (TROCAR) ×1
OBTURATOR OPTICAL STND 8 DVNC (TROCAR) ×1
OBTURATOR OPTICALSTD 8 DVNC (TROCAR) ×1 IMPLANT
PACK LAP CHOLECYSTECTOMY (MISCELLANEOUS) ×2 IMPLANT
PENCIL ELECTRO HAND CTR (MISCELLANEOUS) ×2 IMPLANT
RELOAD STAPLE 45 2.5 WHT DVNC (STAPLE) IMPLANT
RELOAD STAPLE 45 3.5 BLU DVNC (STAPLE) ×1 IMPLANT
RELOAD STAPLER 2.5X45 WHT DVNC (STAPLE) IMPLANT
RELOAD STAPLER 3.5X45 BLU DVNC (STAPLE) ×1 IMPLANT
SEAL CANN UNIV 5-8 DVNC XI (MISCELLANEOUS) ×3 IMPLANT
SEAL XI 5MM-8MM UNIVERSAL (MISCELLANEOUS) ×3
SEALER VESSEL DA VINCI XI (MISCELLANEOUS) ×1
SEALER VESSEL EXT DVNC XI (MISCELLANEOUS) IMPLANT
SET TUBE SMOKE EVAC HIGH FLOW (TUBING) ×2 IMPLANT
SOLUTION ELECTROLUBE (MISCELLANEOUS) ×2 IMPLANT
STAPLER 45 DA VINCI SURE FORM (STAPLE) ×1
STAPLER 45 SUREFORM DVNC (STAPLE) ×1 IMPLANT
STAPLER CANNULA SEAL DVNC XI (STAPLE) ×1 IMPLANT
STAPLER CANNULA SEAL XI (STAPLE) ×1
STAPLER RELOAD 2.5X45 WHITE (STAPLE)
STAPLER RELOAD 2.5X45 WHT DVNC (STAPLE)
STAPLER RELOAD 3.5X45 BLU DVNC (STAPLE) ×1
STAPLER RELOAD 3.5X45 BLUE (STAPLE) ×1
STAPLER SKIN PROX 35W (STAPLE) ×1 IMPLANT
SUT ETHILON 3-0 FS-10 30 BLK (SUTURE) ×2
SUT MNCRL AB 4-0 PS2 18 (SUTURE) ×2 IMPLANT
SUT VIC AB 3-0 SH 27 (SUTURE)
SUT VIC AB 3-0 SH 27X BRD (SUTURE) IMPLANT
SUT VICRYL 0 AB UR-6 (SUTURE) ×2 IMPLANT
SUTURE EHLN 3-0 FS-10 30 BLK (SUTURE) IMPLANT
SYR 30ML LL (SYRINGE) ×2 IMPLANT
SYSTEM WECK SHIELD CLOSURE (TROCAR) ×1 IMPLANT
WATER STERILE IRR 500ML POUR (IV SOLUTION) ×2 IMPLANT

## 2021-07-15 NOTE — Progress Notes (Signed)
Inpatient Diabetes Program Recommendations  AACE/ADA: New Consensus Statement on Inpatient Glycemic Control   Target Ranges:  Prepandial:   less than 140 mg/dL      Peak postprandial:   less than 180 mg/dL (1-2 hours)      Critically ill patients:  140 - 180 mg/dL    Latest Reference Range & Units 07/15/21 07:55  Glucose-Capillary 70 - 99 mg/dL 299 (H)    Latest Reference Range & Units 07/14/21 16:28 07/14/21 17:39 07/14/21 22:27  Glucose-Capillary 70 - 99 mg/dL 242 (H) 683 (H) 419 (H)    Latest Reference Range & Units 07/14/21 12:19  Glucose 70 - 99 mg/dL 622 (HH)   Review of Glycemic Control  Diabetes history: DM2 Outpatient Diabetes medications: Levemir 60 units QHS, Novolog 50 units BID (with lunch and supper), Glipizide XL 10 mg BID, Janumet 50-1000 mg BID, Ozempic Qweek (Monday) Current orders for Inpatient glycemic control: Semglee 45 units QHS, Novolog 0-15 units TID with meals, Novolog 0-5 units QHS  Inpatient Diabetes Program Recommendations:    HbgA1C: Please consider ordering an A1C to evaluate glycemic control over the past 2-3 months.  NOTE: Noted consult for diabetes coordinator. Chart reviewed. Patient admitted with appendicitis, UTI, and sepsis. Initial glucose 522 mg/dl on 29/79/89. In reviewing chart, noted patient was being followed by [facility] and last visit was on 05/29/20 with Mollie Germany, NP; A1C was 15.4% on 05/22/20.  Spoke with patient (husband and daughter at bedside) with Marchelle Folks (Spanish interpreter on site at [facility]) about diabetes and home regimen for diabetes control. Patient reports being followed by [facility] and reports that she was seeing Starla Link, NP but she has left the office so she has an appointment on July 21, 2021 with new provider at [facility]. Patient reports she is taking Levemir 60 units QHS, Novolog 50 units BID (with lunch and supper), Glipizide XL 10 mg BID, Janumet 50-1000 mg BID, and  Ozempic Qweek (Monday) as an outpatient for diabetes control. Patient reports taking DM medications as prescribed. Patient reports she does not check glucose very often but when she does it is usually 200-300's mg/dl or higher.  Discussed glucose and A1C goals. Discussed importance of checking CBGs and maintaining good CBG control to prevent long-term and short-term complications. Explained how hyperglycemia leads to damage within blood vessels which lead to the common complications seen with uncontrolled diabetes. Stressed to the patient the importance of improving glycemic control to prevent further complications from uncontrolled diabetes especially following surgery. Discussed impact of nutrition, exercise, stress, sickness, and medications on diabetes control.  Patient is not following a carb modified diet and her husband and daughter (in the room) state that she does not listen to them about her food and she drinks regular sodas and eats whatever she wants. Discussed carbohydrates, carbohydrate goals per day and meal, along with portion sizes. Family asked for further diet education for patient; ordered RD consult for RD to speak directly with patient.  Informed patient that Living Well with DM book was ordered and encouraged patient to read entire book once received. Encouraged patient to check glucose at least 2 times per day and take glucometer with her to to doctor appointments. Explained how the doctor can use the glucose trends to continue to make adjustments with DM medications if needed. Patient states she has everything she needs at home for DM management.  Patient verbalized understanding of information discussed and reports no further questions at this time related to diabetes.  Thanks, Orlando Penner, RN, MSN, CDE Diabetes Coordinator Inpatient Diabetes Program [phone] (Team Pager from 8am to 5pm)

## 2021-07-15 NOTE — Op Note (Signed)
Preoperative diagnosis: acute appendicitis  Postoperative diagnosis: Same  Procedure: Robotic assisted laparoscopic appendectomy.  Anesthesia: GETA  Surgeon: Sung Amabile  Wound Classification: clean contaminated  Specimen: Appendix  Complications: None  Estimated Blood Loss: 75 mL   Indications: Patient is a 55 y.o. female  presented with above.  Please see H&P for further details.    FIndings: 1.  Irritated appendix  2. peri-appendiceal phlegmon 3. Normal anatomy 4. Appendiceal artery ligated and divided with vessel sealer 5. Adequate hemostasis.   Description of procedure: The patient was placed on the operating table in the supine position, left arm tucked. General anesthesia was induced. A time-out was completed verifying correct patient, procedure, site, positioning, and implant(s) and/or special equipment prior to beginning this procedure. The abdomen was prepped and draped in the usual sterile fashion.   Palmer's point located and Veress needle was inserted.  After confirming 2 clicks and a positive saline drop test, gas insufflation was initiated until the abdominal pressure was measured at 15 mmHg.  Afterwards, the Veress needle was removed and a 8 mm port was placed through a periumbilical site using Optiview technique after incision with an 11 blade.  After local was infused, 2 additional incision was made 8 cm apart each side along the left side of the abdominal wall from the initial incision.  An 8 mm port was caudaed and 11mm port cephalad from initial incision, both under direct visualization.  No injuries from trocar placements were noted. The table was placed in the Trendelenburg position with the right side elevated.  Xi robotic platform was then brought to the operative field and docked.  An inflamed appendix was identified in retrocecal location with extensive adhesions and phlegmon formation to portions of cecum and adjacent small bowel.  The appendix and  associated phelgmon carefully dissected away from surrounding structures and eventually elevated into view.  Infection was present within the abdominal cavity due to appendicitis. The mesentery was friable but densly adhered to appendix.  Vessel sealer used to transect the mesoappendix to minimize bleeding from edges.  This was carried down to base of appendix, where additional dissection revealed a inflammed but viable cecal attachment.  A blue load linear cutting stapler was then used to divide and staple the base of the appendix. No bleeding from the staple lines noted.  The appendix was placed in an endoscopic retrieval bag and removed.   The appendiceal stump and mesoappendix staple line examined again and hemostasis noted. No other pathology was identified within pelvis. Area extensive irrigated, and 15Fr drain placed through LLQ port and place over the appendiceal stump and the fat pad that was used to cover the area. Drain then secured to skin using 3-0 nylon. The 12 mm trocar removed and port site closed with Efx shield.  Remaining trocars were removed under direct vision. No bleeding was noted.The abdomen was allowed to collapse. Staples used to close incision sites due to high risk of infection, and covered with honeycomb dressing and drain covered with drain sponge.   The patient tolerated the procedure well, awakened from anesthesia and was taken to the postanesthesia care unit in satisfactory condition.  Sponge count and instrument count correct at the end of the procedure.

## 2021-07-15 NOTE — Progress Notes (Signed)
Nutrition Brief Note  Patient identified on the Malnutrition Screening Tool (MST) Report  Wt Readings from Last 15 Encounters:  07/14/21 65.1 kg  01/30/20 73.5 kg  05/05/18 73.8 kg  04/23/18 79 kg  04/20/18 83 kg   Pt admitted with acute appendicitis.   Reviewed I/O's: +459 ml x 24 hours  UOP: 250 ml x 24 hours  Pt down in OR at time of visit for appendectomy.   Per DM coordinator notes, family requesting RD consult while hospitalized as pt does not following a DM diet at home (still consumes regular soda, etc). Per endocrinology notes from [facility], pt with history of poor self-management (she does not like to check her blood sugars because it hurts her fingers).    RD provided "Carbohydrate Counting for People with Diabetes" handout from the Academy of Nutrition and Dietetics in pt's preferred language of Spanish. Attached handout to AVS? Discharge summary. RD also referred to outpatient diabetes education through [facility]'s Nutrition and Diabetes Education Services for further reinforcement.   Per CareEverywhere, wt weighed 147# at last endocrinology visit. Reviewed wt hx; pt has experienced a 2.8% wt loss over the past 10 months, which is not significant for time frame.   Lab Results  Component Value Date   HGBA1C 12.8 (H) 01/31/2020  PTA DM medications are 50-1000 mg sitagliptin-metformin BID, 10 mg glipizide BID, 39 units insulin aspart BID, and 50 units insulin detemir daily.   Labs reviewed: CBGS: 159-311 (inpatient orders for glycemic control are 0-15 units insulin aspart TID with meals, 0-5 units inuslin aspart daily at bedtime, and 45 units insulin glargine-yfgn daily).     Current diet order is NPO, patient is consuming approximately n/a% of meals at this time. Labs and medications reviewed.   No nutrition interventions warranted at this time. If nutrition issues arise, please consult RD.   Levada Schilling, RD, LDN, CDCES Registered Dietitian II Certified Diabetes  Care and Education Specialist Please refer to [facility] for RD and/or RD on-call/weekend/after hours pager

## 2021-07-15 NOTE — Progress Notes (Signed)
PHARMACY - PHYSICIAN COMMUNICATION CRITICAL VALUE ALERT - BLOOD CULTURE IDENTIFICATION (BCID)  Results for orders placed or performed during the hospital encounter of 04/20/18  Blood Culture ID Panel (Reflexed) (Collected: 04/20/2018  5:14 AM)  Result Value Ref Range   Enterococcus species NOT DETECTED NOT DETECTED   Listeria monocytogenes NOT DETECTED NOT DETECTED   Staphylococcus species NOT DETECTED NOT DETECTED   Staphylococcus aureus (BCID) NOT DETECTED NOT DETECTED   Streptococcus species NOT DETECTED NOT DETECTED   Streptococcus agalactiae NOT DETECTED NOT DETECTED   Streptococcus pneumoniae NOT DETECTED NOT DETECTED   Streptococcus pyogenes NOT DETECTED NOT DETECTED   Acinetobacter baumannii NOT DETECTED NOT DETECTED   Enterobacteriaceae species DETECTED (A) NOT DETECTED   Enterobacter cloacae complex NOT DETECTED NOT DETECTED   Escherichia coli DETECTED (A) NOT DETECTED   Klebsiella oxytoca NOT DETECTED NOT DETECTED   Klebsiella pneumoniae NOT DETECTED NOT DETECTED   Proteus species NOT DETECTED NOT DETECTED   Serratia marcescens NOT DETECTED NOT DETECTED   Carbapenem resistance NOT DETECTED NOT DETECTED   Haemophilus influenzae NOT DETECTED NOT DETECTED   Neisseria meningitidis NOT DETECTED NOT DETECTED   Pseudomonas aeruginosa NOT DETECTED NOT DETECTED   Candida albicans NOT DETECTED NOT DETECTED   Candida glabrata NOT DETECTED NOT DETECTED   Candida krusei NOT DETECTED NOT DETECTED   Candida parapsilosis NOT DETECTED NOT DETECTED   Candida tropicalis NOT DETECTED NOT DETECTED   BCID results:  3 of 4 bottles with both Escherichia coli and Bacteroides fragilis, no resistance detected.  Pt currently on Ceftriaxone 2 gm and Zosyn 3.375.  Pt did receive one time dose of Metronidazole.  Recommendation would be to continue Ceftriaxone, restart Metronidazole, and stop Zosyn.  Name of physician contacted: Andrez Grime, MD  Changes to prescribed antibiotics required: Stop Zosyn,  restart Flagyl.  Otelia Sergeant, PharmD, [facility] 07/15/2021 2:03 AM

## 2021-07-15 NOTE — TOC Initial Note (Signed)
Transition of Care [facility]) - Initial/Assessment Note    Patient Details  Name: Nicole Villegas MRN: [id number] Date of Birth: 1966-01-17  Transition of Care [facility]) CM/SW Contact:    Chapman Fitch, RN Phone Number: 07/15/2021, 4:38 PM  Clinical Narrative:                  Transition of Care [facility]) Screening Note   Patient Details  Name: Nicole Villegas Date of Birth: 1966-04-16   Transition of Care [facility]) CM/SW Contact:    Chapman Fitch, RN Phone Number: 07/15/2021, 4:38 PM    Transition of Care Department [facility]) has reviewed patient and no TOC needs have been identified at this time. We will continue to monitor patient advancement through interdisciplinary progression rounds. If new patient transition needs arise, please place a TOC consult.          Patient Goals and CMS Choice        Expected Discharge Plan and Services                                                Prior Living Arrangements/Services                       Activities of Daily Living Home Assistive Devices/Equipment: Eyeglasses ADL Screening (condition at time of admission) Patient's cognitive ability adequate to safely complete daily activities?: Yes Is the patient deaf or have difficulty hearing?: No Does the patient have difficulty seeing, even when wearing glasses/contacts?: No Does the patient have difficulty concentrating, remembering, or making decisions?: No Patient able to express need for assistance with ADLs?: Yes Does the patient have difficulty dressing or bathing?: No Independently performs ADLs?: Yes (appropriate for developmental age) Does the patient have difficulty walking or climbing stairs?: No Weakness of Legs: Both Weakness of Arms/Hands: None  Permission Sought/Granted                  Emotional Assessment              Admission diagnosis:  Acute appendicitis [K35.80] Acute appendicitis with localized peritonitis, unspecified whether  abscess present, unspecified whether gangrene present, unspecified whether perforation present [K35.30] Patient Active Problem List   Diagnosis Date Noted   Acute appendicitis 07/14/2021   Type II diabetes mellitus with renal manifestations (HCC) 07/14/2021   CKD (chronic kidney disease), stage II 07/14/2021   HLD (hyperlipidemia) 07/14/2021   Hypertension    GERD (gastroesophageal reflux disease)    Depression    Severe sepsis (HCC)    Elevated troponin    Chest pain 01/30/2020   UTI (urinary tract infection) 04/20/2018   PCP:  Marya Fossa, PA-C Pharmacy:   [facility] [place], [place] - [address] [place] [address] Phone: [phone] Fax: [phone]     Social Determinants of Health (SDOH) Interventions    Readmission Risk Interventions No flowsheet data found.

## 2021-07-15 NOTE — Anesthesia Procedure Notes (Signed)
Procedure Name: Intubation Date/Time: 07/15/2021 12:24 PM Performed by: Ginger Carne, CRNA Pre-anesthesia Checklist: Patient identified, Emergency Drugs available, Suction available, Patient being monitored and Timeout performed Patient Re-evaluated:Patient Re-evaluated prior to induction Oxygen Delivery Method: Circle system utilized Preoxygenation: Pre-oxygenation with 100% oxygen Induction Type: IV induction and Rapid sequence Laryngoscope Size: McGraph and 4 Grade View: Grade I Tube type: Oral Tube size: 6.5 mm Number of attempts: 1 Airway Equipment and Method: Stylet and Video-laryngoscopy Placement Confirmation: ETT inserted through vocal cords under direct vision, positive ETCO2 and breath sounds checked- equal and bilateral Secured at: 19 cm Tube secured with: Tape Dental Injury: Teeth and Oropharynx as per pre-operative assessment

## 2021-07-15 NOTE — Transfer of Care (Signed)
Immediate Anesthesia Transfer of Care Note  Patient: Nicole Villegas  Procedure(s) Performed: XI ROBOTIC LAPAROSCOPIC ASSISTED APPENDECTOMY  Patient Location: PACU  Anesthesia Type:General  Level of Consciousness: awake, alert  and oriented  Airway & Oxygen Therapy: Patient Spontanous Breathing and Patient connected to face mask oxygen  Post-op Assessment: Report given to RN and Post -op Vital signs reviewed and stable  Post vital signs: Reviewed and stable  Last Vitals:  Vitals Value Taken Time  BP 145/64 07/15/21 1404  Temp 37 C 07/15/21 1404  Pulse 84 07/15/21 1406  Resp 26 07/15/21 1407  SpO2 100 % 07/15/21 1406  Vitals shown include unvalidated device data.  Last Pain:  Vitals:   07/15/21 1404  TempSrc:   PainSc: Asleep         Complications: No notable events documented.

## 2021-07-15 NOTE — Progress Notes (Signed)
PROGRESS NOTE    Nicole Villegas  KAJ:[id number] DOB: [birth year] DOA: 07/14/2021 PCP: Kerri Perches, PA-C   Assessment & Plan:   Principal Problem:   Acute appendicitis Active Problems:   UTI (urinary tract infection)   Type II diabetes mellitus with renal manifestations ([place])   CKD (chronic kidney disease), stage II   HLD (hyperlipidemia)   Hypertension   GERD (gastroesophageal reflux disease)   Depression   Severe sepsis ([place])   Acute appendicitis: s/p lap appendectomy 07/15/21 as per general surg. CT scan showed acute appendicitis without complications.  Continue on IV flagyl, rocephin. IV morphine prn   Bacteremia: blood cxs growing e.coli and bacteroides fragilis. Continue on IV flagyl, rocephin. Likely secondary to above    UTI: urine cx is pending. Continue on IV abxs   Severe sepsis:  due to UTI, acute appendicitis & bacteremia. Met cirtieria w/ tachycardia, fever, elevated lactic acid & above infections. Continue on IVFs. Continue on IV abxs  Thrombocytopenia: etiology unclear. Will continue to monitor    DM2: HbA1c 12.8, poorly controlled. Continue on glargine, SSI w/ accuchecks   CKDII: Baseline creatinine 0.7 on 05/22/2020. Cr is back to baseline today    HLD: not taking a statin currently  HTN:  continue on home dose of atenolol. Continue ton hold home dose of amlodipine, prinzide   GERD: continue on PPI    Depression: severity unknown. Continue on home dose of nortriptyline   DVT prophylaxis: SCDs Code Status: full  Family Communication: discussed pt's care w/ pt's family at bedside and answered their questions  Disposition Plan: depends on PT/OT recs (not consulted yet)   Level of care: Med-Surg  Status is: Inpatient  Remains inpatient appropriate because: severity of illness, s/p appendectomy today, on IV abxs for bacteremia   Consultants:  General surg   Procedures:  Antimicrobials: flagyl, rocephin    Subjective: Pt c/o abd  pain  Objective: Vitals:   07/15/21 0045 07/15/21 0420 07/15/21 0657 07/15/21 0754  BP:  115/62  103/63  Pulse:  87  83  Resp:  20  16  Temp: (!) 101.3 F (38.5 C) (!) 102 F (38.9 C) (!) 101.2 F (38.4 C) 98.9 F (37.2 C)  TempSrc: Oral Oral Oral Oral  SpO2:  97%  97%  Weight:      Height:        Intake/Output Summary (Last 24 hours) at 07/15/2021 0824 Last data filed at 07/15/2021 0355 Gross per 24 hour  Intake 459.06 ml  Output --  Net 459.06 ml   Filed Weights   07/14/21 1206 07/14/21 2216  Weight: 73.5 kg 65.1 kg    Examination:  General exam: Appears calm and comfortable  Respiratory system: Clear to auscultation. Respiratory effort normal. Cardiovascular system: S1 & S2 +. No rubs, gallops or clicks. Moves all extremities  Gastrointestinal system: Abdomen is nondistended, soft and tenderness to palpation. Normal bowel sounds heard. Central nervous system: Alert and oriented. Moves all extremities  Psychiatry: Judgement and insight appear normal. Flat mood and affect     Data Reviewed: I have personally reviewed following labs and imaging studies  CBC: Recent Labs  Lab 07/14/21 1219 07/15/21 0539  WBC 7.2 8.1  NEUTROABS 6.7  --   HGB 15.4* 12.2  HCT 45.0 35.0*  MCV 83.5 82.7  PLT 237 974*   Basic Metabolic Panel: Recent Labs  Lab 07/14/21 1219 07/15/21 0539  NA 126* 135  K 4.1 3.5  CL 88* 103  CO2 23 24  GLUCOSE 522* 197*  BUN 20 19  CREATININE 1.12* 0.79  CALCIUM 9.5 8.4*   GFR: Estimated Creatinine Clearance: 68.6 mL/min (by C-G formula based on SCr of 0.79 mg/dL). Liver Function Tests: Recent Labs  Lab 07/14/21 1219  AST 31  ALT 15  ALKPHOS 95  BILITOT 1.5*  PROT 8.3*  ALBUMIN 4.1   No results for input(s): LIPASE, AMYLASE in the last 168 hours. No results for input(s): AMMONIA in the last 168 hours. Coagulation Profile: Recent Labs  Lab 07/14/21 1219  INR 1.1   Cardiac Enzymes: No results for input(s): CKTOTAL,  CKMB, CKMBINDEX, TROPONINI in the last 168 hours. BNP (last 3 results) No results for input(s): PROBNP in the last 8760 hours. HbA1C: No results for input(s): HGBA1C in the last 72 hours. CBG: Recent Labs  Lab 07/14/21 1628 07/14/21 1739 07/14/21 2227 07/15/21 0755  GLUCAP 364* 311* 166* 176*   Lipid Profile: No results for input(s): CHOL, HDL, LDLCALC, TRIG, CHOLHDL, LDLDIRECT in the last 72 hours. Thyroid Function Tests: No results for input(s): TSH, T4TOTAL, FREET4, T3FREE, THYROIDAB in the last 72 hours. Anemia Panel: No results for input(s): VITAMINB12, FOLATE, FERRITIN, TIBC, IRON, RETICCTPCT in the last 72 hours. Sepsis Labs: Recent Labs  Lab 07/14/21 1219 07/14/21 1727  PROCALCITON 8.40  --   LATICACIDVEN 7.1* 5.9*    Recent Results (from the past 240 hour(s))  Resp Panel by RT-PCR (Flu A&B, Covid) Nasopharyngeal Swab     Status: None   Collection Time: 07/14/21 12:19 PM   Specimen: Nasopharyngeal Swab; Nasopharyngeal(NP) swabs in vial transport medium  Result Value Ref Range Status   SARS Coronavirus 2 by RT PCR NEGATIVE NEGATIVE Final    Comment: (NOTE) SARS-CoV-2 target nucleic acids are NOT DETECTED.  The SARS-CoV-2 RNA is generally detectable in upper respiratory specimens during the acute phase of infection. The lowest concentration of SARS-CoV-2 viral copies this assay can detect is 138 copies/mL. A negative result does not preclude SARS-Cov-2 infection and should not be used as the sole basis for treatment or other patient management decisions. A negative result may occur with  improper specimen collection/handling, submission of specimen other than nasopharyngeal swab, presence of viral mutation(s) within the areas targeted by this assay, and inadequate number of viral copies(<138 copies/mL). A negative result must be combined with clinical observations, patient history, and epidemiological information. The expected result is Negative.  Fact Sheet  for Patients:  EntrepreneurPulse.com.au  Fact Sheet for Healthcare Providers:  IncredibleEmployment.be  This test is no t yet approved or cleared by the [place] FDA and  has been authorized for detection and/or diagnosis of SARS-CoV-2 by FDA under an Emergency Use Authorization (EUA). This EUA will remain  in effect (meaning this test can be used) for the duration of the COVID-19 declaration under Section 564(b)(1) of the Act, 21 U.S.C.section 360bbb-3(b)(1), unless the authorization is terminated  or revoked sooner.       Influenza A by PCR NEGATIVE NEGATIVE Final   Influenza B by PCR NEGATIVE NEGATIVE Final    Comment: (NOTE) The Xpert Xpress SARS-CoV-2/FLU/RSV plus assay is intended as an aid in the diagnosis of influenza from Nasopharyngeal swab specimens and should not be used as a sole basis for treatment. Nasal washings and aspirates are unacceptable for Xpert Xpress SARS-CoV-2/FLU/RSV testing.  Fact Sheet for Patients: EntrepreneurPulse.com.au  Fact Sheet for Healthcare Providers: IncredibleEmployment.be  This test is not yet approved or cleared by the [place] FDA and has been authorized for detection  and/or diagnosis of SARS-CoV-2 by FDA under an Emergency Use Authorization (EUA). This EUA will remain in effect (meaning this test can be used) for the duration of the COVID-19 declaration under Section 564(b)(1) of the Act, 21 U.S.C. section 360bbb-3(b)(1), unless the authorization is terminated or revoked.  Performed at [facility], [address] [place], [place] [address]   Culture, blood (Routine x 2)     Status: None (Preliminary result)   Collection Time: 07/14/21 12:19 PM   Specimen: BLOOD  Result Value Ref Range Status   Specimen Description   Final    BLOOD RIGHT ANTECUBITAL Performed at [facility], [address] [place], [place] [address]     Special Requests   Final    BOTTLES DRAWN AEROBIC AND ANAEROBIC Blood Culture adequate volume Performed at [facility], [address] [place], [place] [address]    Culture  Setup Time   Final    GRAM NEGATIVE COCCI ANAEROBIC BOTTLE ONLY Organism ID to follow Gram Stain Report Called to,Read Back By and Verified With: NATHAN BELUE 07/15/21 @ 0042 BY SB    Culture GRAM NEGATIVE RODS  Final   Report Status PENDING  Incomplete  Blood Culture ID Panel (Reflexed)     Status: Abnormal   Collection Time: 07/14/21 12:19 PM  Result Value Ref Range Status   Enterococcus faecalis NOT DETECTED NOT DETECTED Final   Enterococcus Faecium NOT DETECTED NOT DETECTED Final   Listeria monocytogenes NOT DETECTED NOT DETECTED Final   Staphylococcus species NOT DETECTED NOT DETECTED Final   Staphylococcus aureus (BCID) NOT DETECTED NOT DETECTED Final   Staphylococcus epidermidis NOT DETECTED NOT DETECTED Final   Staphylococcus lugdunensis NOT DETECTED NOT DETECTED Final   Streptococcus species NOT DETECTED NOT DETECTED Final   Streptococcus agalactiae NOT DETECTED NOT DETECTED Final   Streptococcus pneumoniae NOT DETECTED NOT DETECTED Final   Streptococcus pyogenes NOT DETECTED NOT DETECTED Final   A.calcoaceticus-baumannii NOT DETECTED NOT DETECTED Final   Bacteroides fragilis DETECTED (A) NOT DETECTED Final    Comment: RESULT CALLED TO, READ BACK BY AND VERIFIED WITH: NATHAN BELUE 07/15/21 @ 0042 BY SB    Enterobacterales DETECTED (A) NOT DETECTED Final    Comment: Enterobacterales represent a large order of gram negative bacteria, not a single organism. RESULT CALLED TO, READ BACK BY AND VERIFIED WITH: NATHAN BELUE 07/15/21 @ 0042 BY SB    Enterobacter cloacae complex NOT DETECTED NOT DETECTED Final   Escherichia coli DETECTED (A) NOT DETECTED Final    Comment: RESULT CALLED TO, READ BACK BY AND VERIFIED WITH: NATHAN BELUE 07/15/21 @ 0042 BY SB    Klebsiella aerogenes NOT DETECTED NOT  DETECTED Final   Klebsiella oxytoca NOT DETECTED NOT DETECTED Final   Klebsiella pneumoniae NOT DETECTED NOT DETECTED Final   Proteus species NOT DETECTED NOT DETECTED Final   Salmonella species NOT DETECTED NOT DETECTED Final   Serratia marcescens NOT DETECTED NOT DETECTED Final   Haemophilus influenzae NOT DETECTED NOT DETECTED Final   Neisseria meningitidis NOT DETECTED NOT DETECTED Final   Pseudomonas aeruginosa NOT DETECTED NOT DETECTED Final   Stenotrophomonas maltophilia NOT DETECTED NOT DETECTED Final   Candida albicans NOT DETECTED NOT DETECTED Final   Candida auris NOT DETECTED NOT DETECTED Final   Candida glabrata NOT DETECTED NOT DETECTED Final   Candida krusei NOT DETECTED NOT DETECTED Final   Candida parapsilosis NOT DETECTED NOT DETECTED Final   Candida tropicalis NOT DETECTED NOT DETECTED Final   Cryptococcus  neoformans/gattii NOT DETECTED NOT DETECTED Final   CTX-M ESBL NOT DETECTED NOT DETECTED Final   Carbapenem resistance IMP NOT DETECTED NOT DETECTED Final   Carbapenem resistance KPC NOT DETECTED NOT DETECTED Final   Carbapenem resistance NDM NOT DETECTED NOT DETECTED Final   Carbapenem resist OXA 48 LIKE NOT DETECTED NOT DETECTED Final   Carbapenem resistance VIM NOT DETECTED NOT DETECTED Final    Comment: Performed at [facility], [address] [place], [place] [address]  Culture, blood (Routine x 2)     Status: None (Preliminary result)   Collection Time: 07/14/21  1:30 PM   Specimen: BLOOD  Result Value Ref Range Status   Specimen Description   Final    BLOOD LEFT ANTECUBITAL Performed at [facility], [address] [place], [place] [address]    Special Requests   Final    BOTTLES DRAWN AEROBIC AND ANAEROBIC Blood Culture adequate volume Performed at [facility], [address] [place], [place] [address]    Culture  Setup Time   Final    GRAM NEGATIVE COCCI IN BOTH AEROBIC AND ANAEROBIC BOTTLES RESULT CALLED TO, READ  BACK BY AND VERIFIED WITH: NATHAN BELUE 07/15/21 @ 0042 BY SB    Culture GRAM NEGATIVE RODS  Final   Report Status PENDING  Incomplete  Surgical PCR screen     Status: Abnormal   Collection Time: 07/15/21  1:32 AM   Specimen: Nasal Mucosa; Nasal Swab  Result Value Ref Range Status   MRSA, PCR NEGATIVE NEGATIVE Final   Staphylococcus aureus POSITIVE (A) NEGATIVE Final    Comment: (NOTE) The Xpert SA Assay (FDA approved for NASAL specimens in patients 6 years of age and older), is one component of a comprehensive surveillance program. It is not intended to diagnose infection nor to guide or monitor treatment. Performed at [facility], [address] [place], [place] [address]          Radiology Studies: DG Chest 2 View  Result Date: 07/14/2021 CLINICAL DATA:  Suspected Sepsis, abdominal pain, nausea, vomiting and fever EXAM: CHEST - 2 VIEW COMPARISON:  01/30/2020 chest radiograph. FINDINGS: Stable cardiomediastinal silhouette with normal heart size. No pneumothorax. No pleural effusion. Lungs appear clear, with no acute consolidative airspace disease and no pulmonary edema. IMPRESSION: No active cardiopulmonary disease. Electronically Signed   By: Ilona Sorrel M.D.   On: 07/14/2021 12:54   CT ABDOMEN PELVIS W CONTRAST  Result Date: 07/14/2021 CLINICAL DATA:  Abdominal pain, acute, nonlocalized EXAM: CT ABDOMEN AND PELVIS WITH CONTRAST TECHNIQUE: Multidetector CT imaging of the abdomen and pelvis was performed using the standard protocol following bolus administration of intravenous contrast. CONTRAST:  157m OMNIPAQUE IOHEXOL 300 MG/ML  SOLN COMPARISON:  None. FINDINGS: Motion artifact is present. Lower chest: No acute abnormality. Hepatobiliary: No focal liver abnormality is seen. No gallstones, gallbladder wall thickening, or biliary dilatation. Pancreas: Unremarkable. Spleen: Unremarkable. Adrenals/Urinary Tract: Adrenals, kidneys, and partially distended bladder are  unremarkable. Stomach/Bowel: Stomach is within normal limits. Bowel is normal in caliber. Fluid-filled, dilated appendix with surrounding fat infiltration. Vascular/Lymphatic: Atherosclerosis.  No enlarged lymph nodes. Reproductive: Uterus and bilateral adnexa are unremarkable. Other: No free air.  No abscess. Musculoskeletal: Lumbar spine degenerative changes. IMPRESSION: Acute appendicitis without complication. These results were called by telephone at the time of interpretation on 07/14/2021 at 2:07 pm to provider CARI TRIPLETT , who verbally acknowledged these results. Electronically Signed   By: PMacy MisM.D.   On: 07/14/2021 14:15  Scheduled Meds:  atenolol  50 mg Oral Daily   gabapentin  300 mg Oral BID   insulin aspart  0-15 Units Subcutaneous TID WC   insulin aspart  0-5 Units Subcutaneous QHS   insulin glargine-yfgn  45 Units Subcutaneous Daily   living well with diabetes book- in spanish   Does not apply Once   mupirocin ointment  1 application Nasal BID   nortriptyline  20 mg Oral QHS   ondansetron (ZOFRAN) IV  4 mg Intravenous Once   pantoprazole  40 mg Oral Daily   Continuous Infusions:  sodium chloride Stopped (07/14/21 2201)   metronidazole 100 mL/hr at 07/15/21 0449   sodium chloride       LOS: 1 day    Time spent: 31 mins     Wyvonnia Dusky, MD [facility] Pager [phone]  If 7PM-7AM, please contact night-coverage 07/15/2021, 8:24 AM

## 2021-07-15 NOTE — Interval H&P Note (Signed)
Euglycemic now. Ok to proceed

## 2021-07-15 NOTE — Discharge Instructions (Addendum)
Laparoscopic Appendectomy, Care After This sheet gives you information about how to care for yourself after your procedure. Your doctor may also give you more specific instructions. If you have problems or questions, contact your doctor. Follow these instructions at home: Care for cuts from surgery (incisions)  Follow instructions from your doctor about how to take care of your cuts from surgery. Make sure you: Wash your hands with soap and water before you change your bandage (dressing). If you cannot use soap and water, use hand sanitizer. Change your bandage as told by your doctor. Leave stitches (sutures), skin glue, or skin tape (adhesive) strips in place. They may need to stay in place for 2 weeks or longer. If tape strips get loose and curl up, you may trim the loose edges. Do not remove tape strips completely unless your doctor says it is okay. Do not take baths, swim, or use a hot tub until your doctor says it is okay. OK TO SHOWER 24HRS AFTER YOUR SURGERY.  Check your surgical cut area every day for signs of infection. Check for: More redness, swelling, or pain. More fluid or blood. Warmth. Pus or a bad smell. Activity Do not drive or use heavy machinery while taking prescription pain medicine. Do not play contact sports until your doctor says it is okay. Do not drive for 24 hours if you were given a medicine to help you relax (sedative). Rest as needed. Do not return to work or school until your doctor says it is okay. General instructions  tylenol and advil as needed for discomfort.  Please alternate between the two every four hours as needed for pain.    Use narcotics, if prescribed, only when tylenol and motrin is not enough to control pain.  325-650mg  every 8hrs to max of 3000mg /24hrs (including the 325mg  in every norco dose) for the tylenol.    Advil up to 800mg  per dose every 8hrs as needed for pain.   To prevent or treat constipation while you are taking prescription pain  medicine, your doctor may recommend that you: Drink enough fluid to keep your pee (urine) clear or pale yellow. Take over-the-counter or prescription medicines. Eat foods that are high in fiber, such as fresh fruits and vegetables, whole grains, and beans. Limit foods that are high in fat and processed sugars, such as fried and sweet foods. Contact a doctor if: You develop a rash. You have more redness, swelling, or pain around your surgical cuts. You have more fluid or blood coming from your surgical cuts. Your surgical cuts feel warm to the touch. You have pus or a bad smell coming from your surgical cuts. You have a fever. One or more of your surgical cuts breaks open. Get help right away if: You have trouble breathing. You have chest pain. You faint or feel dizzy when you stand. You have leg pain. This information is not intended to replace advice given to you by your health care provider. Make sure you discuss any questions you have with your health care provider. Document Released: 04/13/2008 Document Revised: 01/24/2016 Document Reviewed: 12/22/2015 Elsevier Interactive Patient Education  2019 04/15/2008.      03/26/2016 carbohidratos y la diabetes  Por qu es importante el conteo de carbohidratos?  ? Contar las porciones de carbohidratos ayuda a 02/21/2016 nivel de glucosa (azcar) en su sangre para que se sienta mejor.  ? El equilibrio [employer] carbohidratos que come y [employer] determina el nivel de glucosa que tendr  en la sangre despus de comer.  ? Contar carbohidratos tambin le ayudar a planificar sus comidas.   Qu alimentos contienen carbohidratos?  Entre los alimentos con carbohidratos se incluyen:  ? Panes, galletas saladas y cereales  ? Pastas, arroz y granos  ? Vegetales (verduras) con almidn, como papas, elote (maz o choclo) y chcharos (guisantes o arvejas)  ? Frijoles (habichuelas) y legumbres  ? Leche, leche de soya y yogur  ? Nils Pyle y jugos de  fruta  ? Dulces como pasteles, galletas, helados, mermeladas y jaleas   Porciones de carbohidratos  Al planificar comidas para la diabetes, recuerde que un alimento con 1 porcin de carbohidratos contiene aproximadamente 15 gramos de carbohidratos:  ? Revise el tamao de las porciones con tazas y cucharas de medir o con una pesa de alimentos.  ? Lea los Datos de Nutricin en las etiquetas de los alimentos para saber cuntos gramos de carbohidratos contienen los alimentos que come.   Los [employer] de este folleto muestran porciones que contienen cerca de 15 gramos de carbohidratos. Copyright Academy of Nutrition and Dietetics. This handout may be duplicated for client education. Carbohydrate Counting for Diabetes (Spanish) - 2   Consejos para planificar sus comidas  ? Un Plan de Alimentacin indica cuntas porciones de carbohidratos consumir en sus comidas y refrigerios (snacks). Para muchos adultos es adecuado comer 3 a 5 porciones de carbohidratos en cada comida y de 1 a 2 porciones de carbohidratos, en cada refrigerio.  ? En un Plan de Alimentacin diaria saludable, la mayora de los carbohidratos provienen de:  o Al menos 6 porciones de frutas y vegetales sin almidn  o Al menos 6 porciones de Forensic scientist, frijoles y Sports administrator con almidn, con al menos 3 de estas porciones de granos integrales (enteros)  o Al menos 2 porciones de Teaching laboratory technician o productos lcteos  ? Revise regularmente su nivel de glucosa en la sangre. Esto puede indicarle si necesita ajustar las horas a las que consume carbohidratos.  ? Comer alimentos que contienen [place], como granos [place], y comer muy pocos alimentos salados es bueno para su salud.  ? Coma 4 a 6 onzas de carne u otros alimentos con protenas (como hamburguesas de soya) cada da. Elija fuentes de protena bajas en grasa, como carne de res y de cerdo bajas en grasa, pollo, pescado, queso bajo en grasa o alimentos vegetarianos como la soya.  ? Coma  algunas grasas saludables, como aceite de [place], de canola y nueces.  ? Coma muy pocas grasas saturadas. Estas grasas no son saludables y se Merchandiser, retail, la crema y las carnes con mucha grasa, como el tocino (tocineta) y las salchichas o Advertising copywriter.  ? Coma muy pocas o nada de grasas trans. Estas grasas no son saludables y se encuentran en todos los alimentos que contienen aceites parcialmente hidrogenados en su lista de ingredientes.   Consejos para leer etiquetas  En los Datos de Nutricin de las etiquetas aparece una lista con el total de gramos de carbohidratos en una porcin estndar. La porcin estndar puede ser mayor o menor que 1 porcin de carbohidratos. Para saber cuntas porciones de carbohidratos hay en un alimento:  ? Primero mire el tamao de la porcin estndar de la etiqueta.  ? Luego verifique el total de gramos de carbohidratos. Esta es la cantidad de carbohidratos en 1 porcin estndar. Divida el total de gramos de carbohidratos por 15. Este nmero equivale al nmero de porciones de carbohidratos en 1  porcin estndar. Recuerde: 1 porcin de carbohidratos equivale a 15 gramos de carbohidratos.  ? Nota: Puede ignorar los gramos de [employer] Datos de Nutricin, ya que estn incluidos en el total de gramos de carbohidratos.  Copyright Academy of Nutrition and Dietetics. This handout may be duplicated for client education. Carbohydrate Counting for Diabetes (Spanish) - 3   Listas de alimentos para el conteo de carbohidratos  1 porcin = cerca de 15 gramos de carbohidratos  Almidones  ? 1 rebanada de pan (1 onza)  ? 1 tortilla (6 pulgadas)  ?  rosca de pan (bagel) grande (1 onza)  ? 2 tortillas para taco (5 pulgadas)  ?  pan para hamburguesa o para salchicha (hot dog) (3/4 onza)  ?  taza de cereal listo para comer sin endulzar  ?  taza de cereal cocido  ? 1 taza de sopa a base de caldo  ? 4-6 galletitas saladas  ? ? taza de pasta o arroz (cocidos)  ?   taza de frijoles, chcharos, granos de elote, camotes (batatas, boniatos), calabaza (zapallo), pur de papas o papas hervidas (cocidos)  ?  papa grande asada (3 onzas)  ?  onza de pretzels, papitas o totopos (tortilla chips)  ? 3 tazas de palomitas de maz (popcorn) (ya preparadas)   Nils Pyle  ? 1 fruta fresca pequea ( a 1 taza)  ?  taza de fruta enlatada o congelada  ? 17 uvas pequeas (3 onzas)  ? 1 taza de meln, bayas (moras)  ?  vaso de jugo de fruta  ? 2 cucharadas de frutas secas (arndanos azules/blueberries, cerezas, arndanos rojos/cranberries, frutas surtidas, uvas pasas/pasitas)  Leche  ? 1 taza de [employer] o reducida en grasa  ? 1 taza de leche de soya  ? ? taza de yogur descremado endulzado con un edulcorante sin azcar (6 onzas)   Dulces y postres  ? pastel cuadrado de 2 pulgadas (sin betn/cobertura)  ? 2 galletitas dulces (? onzas)  ?  taza de helado o yogur congelado  ?  taza de sorbete (sherbet) o nieve (sorbet)  ? 1 cucharada de jarabe (sirope), mermelada, jalea, azcar o miel  ? 2 cucharadas de jarabe bajo en caloras  Copyright Academy of Nutrition and Dietetics. This handout may be duplicated for client education. Carbohydrate Counting for Diabetes (Spanish) - 4   Otros alimentos  ? Cuente 1 taza de vegetales crudos o  taza de vegetales sin almidn, cocidas, como porciones de alimentos con cero (0) carbohidratos o sin restriccin. Si come 3 o ms porciones en una comida, cuntelas como 1 porcin de carbohidratos.  ? Los alimentos que contienen menos de 20 caloras en cada porcin tambin pueden contarse como porciones con cero carbohidratos o alimentos sin restriccin.  ? Cuente 1 taza de guiso (estofado) u otros alimentos combinados como 2 porciones de carbohidratos.   Notas: Copyright Academy of Nutrition and Dietetics. This handout may be duplicated for client education. Carbohydrate Counting for Diabetes (Spanish) - 5   Contar  carbohidratos y la diabetes: Ejemplo de men para 1 da [place]  1 pltano/banana pequeo (1 carbohidrato)   taza de hojuelas de maz (cornflakes) (1 carbohidrato)  1 taza de leche descremada o baja en grasa (1 carbohidrato)  1 rebanada de pan de trigo integral (1 carbohidrato)  1 cucharadita de margarina   [place]mu[place]  2 onzas de rebanadas de [place]  2 rebanadas de pan de trigo integral (2 carbohidratos)  2 hojas de lechuga  4 palitos  de apio  4 palitos de zanahoria  1 manzana mediana (1 carbohidrato)  1 taza de leche descremada o baja en grasa (1 carbohidrato)   Refrigerio  2 cucharadas de uvas pasas/pasitas (1 carbohidrato)   onzas de mini pretzels sin sal (1 carbohidrato)   Cena  3 onzas de carne asada de res, magra   papa grande asada (2 carbohidratos)  1 cucharada de crema agria reducida en grasa   taza de ejotes/habichuelas verdes/chauchas  1 taza de ensalada de vegetales  1 cucharada de aderezo para ensaladas reducido en caloras  1 panecillo de trigo integral (1 carbohidrato)  1 cucharadita de margarina  1 taza de bolitas de meln (1 carbohidrato)   Refrigerio  6 onzas de yogur de frutas bajo en grasa, sin azcar (1 carbohidrato)  2 cucharadas de nueces sin sal

## 2021-07-16 ENCOUNTER — Encounter: Payer: Self-pay | Admitting: Surgery

## 2021-07-16 LAB — BASIC METABOLIC PANEL
Anion gap: 9 (ref 5–15)
BUN: 17 mg/dL (ref 6–20)
CO2: 22 mmol/L (ref 22–32)
Calcium: 8.3 mg/dL — ABNORMAL LOW (ref 8.9–10.3)
Chloride: 105 mmol/L (ref 98–111)
Creatinine, Ser: 0.76 mg/dL (ref 0.44–1.00)
GFR, Estimated: >60 mL/min (ref >60)
Glucose, Bld: 204 mg/dL — ABNORMAL HIGH (ref 70–99)
Potassium: 3.8 mmol/L (ref 3.5–5.1)
Sodium: 136 mmol/L (ref 135–145)

## 2021-07-16 LAB — CBC
HCT: 31.5 % — ABNORMAL LOW (ref 36.0–46.0)
Hemoglobin: 10.9 g/dL — ABNORMAL LOW (ref 12.0–15.0)
MCH: 28.9 pg (ref 26.0–34.0)
MCHC: 34.6 g/dL (ref 30.0–36.0)
MCV: 83.6 fL (ref 80.0–100.0)
Platelets: 124 10*3/uL — ABNORMAL LOW (ref 150–400)
RBC: 3.77 MIL/uL — ABNORMAL LOW (ref 3.87–5.11)
RDW: 13.1 % (ref 11.5–15.5)
WBC: 7.2 10*3/uL (ref 4.0–10.5)
nRBC: 0 % (ref 0.0–0.2)

## 2021-07-16 LAB — GLUCOSE, CAPILLARY
Glucose-Capillary: 174 mg/dL — ABNORMAL HIGH (ref 70–99)
Glucose-Capillary: 228 mg/dL — ABNORMAL HIGH (ref 70–99)
Glucose-Capillary: 258 mg/dL — ABNORMAL HIGH (ref 70–99)
Glucose-Capillary: 243 mg/dL — ABNORMAL HIGH (ref 70–99)

## 2021-07-16 LAB — SURGICAL PATHOLOGY

## 2021-07-16 LAB — URINE CULTURE: Culture: >=100000 — AB

## 2021-07-16 MED ORDER — OXYCODONE-ACETAMINOPHEN 5-325 MG PO TABS
1.0000 | ORAL_TABLET | Freq: Four times a day (QID) | ORAL | Status: DC | PRN
  Administered 2021-07-17 – 2021-07-18 (×3): 1 via ORAL
  Filled 2021-07-16 (×3): qty 1

## 2021-07-16 NOTE — Progress Notes (Signed)
Subjective:  CC: Nicole Villegas is a 55 y.o. female  Hospital stay day 2, 1 Day Post-Op robo lap appy  HPI: No issues. Tolerating clears.  Still has TTP in RLQ.  ROS:  General: Denies weight loss, weight gain, fatigue, fevers, chills, and night sweats. Heart: Denies chest pain, palpitations, racing heart, irregular heartbeat, leg pain or swelling, and decreased activity tolerance. Respiratory: Denies breathing difficulty, shortness of breath, wheezing, cough, and sputum. GI: Denies change in appetite, heartburn, nausea, vomiting, constipation, diarrhea, and blood in stool. GU: Denies difficulty urinating, pain with urinating, urgency, frequency, blood in urine.   Objective:   Temp:  [98.3 F (36.8 C)-99.4 F (37.4 C)] 98.4 F (36.9 C) (12/29 0800) Pulse Rate:  [76-86] 79 (12/29 0800) Resp:  [16-24] 16 (12/29 0800) BP: (116-151)/(60-75) 128/69 (12/29 0800) SpO2:  [97 %-100 %] 99 % (12/29 0851)     Height: 5' 0.98" (154.9 cm) Weight: 65.1 kg BMI (Calculated): 27.15   Intake/Output this shift:   Intake/Output Summary (Last 24 hours) at 07/16/2021 1007 Last data filed at 07/16/2021 0800 Gross per 24 hour  Intake 1916.25 ml  Output 470 ml  Net 1446.25 ml    Constitutional :  alert, cooperative, appears stated age, and no distress  Respiratory:  clear to auscultation bilaterally  Cardiovascular:  regular rate and rhythm  Gastrointestinal: Soft, no guarding, focal TTP RLQ as expected. JP with serosanguinous drainage .   Skin: Cool and moist. Staples c/d/i  Psychiatric: Normal affect, non-agitated, not confused       LABS:  CMP Latest Ref Rng & Units 07/16/2021 07/15/2021 07/14/2021  Glucose 70 - 99 mg/dL 121(F) 758(I) 325(QD)  BUN 6 - 20 mg/dL 17 19 20   Creatinine 0.44 - 1.00 mg/dL 8.26 4.15)  Sodium 135 - 145 mmol/L 136 135 126(L)  Potassium 3.5 - 5.1 mmol/L 3.8 3.5 4.1  Chloride 98 - 111 mmol/L 105 103 88(L)  CO2 22 - 32 mmol/L 22 24 23   Calcium 8.9 - 10.3 mg/dL  8.3(L) 8.4(L) 9.5  Total Protein 6.5 - 8.1 g/dL - - 8.3(H)  Total Bilirubin 0.3 - 1.2 mg/dL - - 1.5(H)  Alkaline Phos 38 - 126 U/L - - 95  AST 15 - 41 U/L - - 31  ALT 0 - 44 U/L - - 15   CBC Latest Ref Rng & Units 07/16/2021 07/15/2021 07/14/2021  WBC 4.0 - 10.5 K/uL 7.2 8.1 7.2  Hemoglobin 12.0 - 15.0 g/dL 10.9(L) 12.2 15.4(H)  Hematocrit 36.0 - 46.0 % 31.5(L) 35.0(L) 45.0  Platelets 150 - 400 K/uL 124(L) 146(L) 237    RADS: N/a Assessment:   S/p robo lap appy- recovering well.  Advance to regular.  Will prefer to have BM  prior to d/c.  Continue antibiotics.  UTI and DM, septicemia- continure care per hospitalist

## 2021-07-16 NOTE — Progress Notes (Signed)
Inpatient Diabetes Program Recommendations  AACE/ADA: New Consensus Statement on Inpatient Glycemic Control   Target Ranges:  Prepandial:   less than 140 mg/dL      Peak postprandial:   less than 180 mg/dL (1-2 hours)      Critically ill patients:  140 - 180 mg/dL    Latest Reference Range & Units 07/15/21 07:55 07/15/21 11:40 07/15/21 14:10 07/15/21 16:50 07/15/21 21:31  Glucose-Capillary 70 - 99 mg/dL 124 (H) 580 (H) 998 (H) 177 (H) 269 (H)   Review of Glycemic Control  Diabetes history: DM2 Outpatient Diabetes medications: Levemir 60 units QHS, Novolog 50 units BID (with lunch and supper), Glipizide XL 10 mg BID, Janumet 50-1000 mg BID, Ozempic Qweek (Monday) Current orders for Inpatient glycemic control: Semglee 45 units daily, Novolog 0-15 units TID with meals, Novolog 0-5 units QHS   Inpatient Diabetes Program Recommendations:     Insulin: In reviewing chart, noted patient did NOT receive Semglee on 07/15/21 (charted as not given, medication not available). Patient received Decadron 4 mg on 07/15/21 at 12:36. Would not recommend any insulin changes at this time as patient should receive Semglee 45 units today at 10:00 am.  HbgA1C: Please consider ordering an A1C to evaluate glycemic control over the past 2-3 months.  Thanks, Orlando Penner, RN, MSN, CDE Diabetes Coordinator Inpatient Diabetes Program [phone] (Team Pager from 8am to 5pm)

## 2021-07-16 NOTE — Progress Notes (Signed)
PROGRESS NOTE    Nicole Villegas  WUJ:[id number] DOB: April 02, 1966 DOA: 07/14/2021 PCP: Kerri Perches, PA-C   Assessment & Plan:   Principal Problem:   Acute appendicitis Active Problems:   UTI (urinary tract infection)   Type II diabetes mellitus with renal manifestations ([place])   CKD (chronic kidney disease), stage II   HLD (hyperlipidemia)   Hypertension   GERD (gastroesophageal reflux disease)   Depression   Severe sepsis ([place])   Acute appendicitis: s/p lap appendectomy 07/15/21 as per general surg. Advanced to carb modified diet today as per general surg. CT scan showed acute appendicitis without complications.  Continue on IV flagyl, rocephin. Morphine, percocet prn   Bacteremia: blood cxs growing e.coli and bacteroides fragilis. Continue on IV rocephin, flagyl. Repeat blood cxs ordered   UTI: urine cx growing e.coli.  Continue on IV abxs    Severe sepsis:  due to UTI, acute appendicitis & bacteremia. Met cirtieria w/ tachycardia, fever, elevated lactic acid & above infections. Sepsis resolved   Thrombocytopenia: etiology unclear. Will continue to monitor     DM2: poorly controlled, HbA1c 12.8. Continue on glargine, SSI w/ accuchecks    CKDII: Baseline creatinine 0.7 on 05/22/2020. Cr is at baseline    HLD: not taking a statin currently   HTN:  continue on home dose of atenolol. Continue to hold home dose of amlodipine, prinzide   GERD: continue on PPI    Depression: severity unknown. Continue on home dose of nortriptyline   DVT prophylaxis: SCDs Code Status: full  Family Communication: discussed pt's care w/ pt's family at bedside and answered their questions  Disposition Plan: likely d/c back home   Level of care: Med-Surg  Status is: Inpatient  Remains inpatient appropriate because: severity of illness, still requiring IV abxs, repeat blood cxs ordered today    Consultants:  General surg   Procedures:  Antimicrobials: flagyl, rocephin     Subjective: Pt c/o malaise & abd pain   Objective: Vitals:   07/15/21 1545 07/15/21 1649 07/15/21 2053 07/16/21 0409  BP: (!) 151/75 (!) 144/74 134/65 116/67  Pulse: 82 86 86 76  Resp:  '17 16 16  ' Temp: 98.7 F (37.1 C) 98.6 F (37 C) 98.3 F (36.8 C) 98.4 F (36.9 C)  TempSrc: Oral Oral    SpO2: 100% 100% 100% 100%  Weight:      Height:        Intake/Output Summary (Last 24 hours) at 07/16/2021 0757 Last data filed at 07/15/2021 1859 Gross per 24 hour  Intake 1916.25 ml  Output 320 ml  Net 1596.25 ml   Filed Weights   07/14/21 1206 07/14/21 2216  Weight: 73.5 kg 65.1 kg    Examination:  General exam: Appears comfortable   Respiratory system: clear breath sounds b/l  Cardiovascular system: S1/S2+. No rubs or clicks  Gastrointestinal system: Abd is soft, tenderness to palpation, ND & hypoactive bowel sounds Central nervous system: alert and oriented. Moves all extremities  Psychiatry: Judgement and insight appears normal. Flat mood and affect     Data Reviewed: I have personally reviewed following labs and imaging studies  CBC: Recent Labs  Lab 07/14/21 1219 07/15/21 0539 07/16/21 0450  WBC 7.2 8.1 7.2  NEUTROABS 6.7  --   --   HGB 15.4* 12.2 10.9*  HCT 45.0 35.0* 31.5*  MCV 83.5 82.7 83.6  PLT 237 146* 956*   Basic Metabolic Panel: Recent Labs  Lab 07/14/21 1219 07/15/21 0539 07/16/21 0450  NA 126* 135  136  K 4.1 3.5 3.8  CL 88* 103 105  CO2 '23 24 22  ' GLUCOSE 522* 197* 204*  BUN '20 19 17  ' CREATININE 1.12* 0.79 0.76  CALCIUM 9.5 8.4* 8.3*   GFR: Estimated Creatinine Clearance: 68.6 mL/min (by C-G formula based on SCr of 0.76 mg/dL). Liver Function Tests: Recent Labs  Lab 07/14/21 1219  AST 31  ALT 15  ALKPHOS 95  BILITOT 1.5*  PROT 8.3*  ALBUMIN 4.1   No results for input(s): LIPASE, AMYLASE in the last 168 hours. No results for input(s): AMMONIA in the last 168 hours. Coagulation Profile: Recent Labs  Lab 07/14/21 1219   INR 1.1   Cardiac Enzymes: No results for input(s): CKTOTAL, CKMB, CKMBINDEX, TROPONINI in the last 168 hours. BNP (last 3 results) No results for input(s): PROBNP in the last 8760 hours. HbA1C: No results for input(s): HGBA1C in the last 72 hours. CBG: Recent Labs  Lab 07/15/21 0755 07/15/21 1140 07/15/21 1410 07/15/21 1650 07/15/21 2131  GLUCAP 176* 159* 176* 177* 269*   Lipid Profile: No results for input(s): CHOL, HDL, LDLCALC, TRIG, CHOLHDL, LDLDIRECT in the last 72 hours. Thyroid Function Tests: No results for input(s): TSH, T4TOTAL, FREET4, T3FREE, THYROIDAB in the last 72 hours. Anemia Panel: No results for input(s): VITAMINB12, FOLATE, FERRITIN, TIBC, IRON, RETICCTPCT in the last 72 hours. Sepsis Labs: Recent Labs  Lab 07/14/21 1219 07/14/21 1727  PROCALCITON 8.40  --   LATICACIDVEN 7.1* 5.9*    Recent Results (from the past 240 hour(s))  Urine Culture     Status: Abnormal (Preliminary result)   Collection Time: 07/14/21 11:39 AM   Specimen: Urine, Random  Result Value Ref Range Status   Specimen Description   Final    URINE, RANDOM Performed at [facility], [address], [place], [place] [address]    Special Requests   Final    NONE Performed at [facility], [address] [place], [place] [address]    Culture (A)  Final    >=100,000 COLONIES/mL GRAM NEGATIVE RODS IDENTIFICATION AND SUSCEPTIBILITIES TO FOLLOW Performed at [facility], [place] [address] [place], [place] [address]    Report Status PENDING  Incomplete  Resp Panel by RT-PCR (Flu A&B, Covid) Nasopharyngeal Swab     Status: None   Collection Time: 07/14/21 12:19 PM   Specimen: Nasopharyngeal Swab; Nasopharyngeal(NP) swabs in vial transport medium  Result Value Ref Range Status   SARS Coronavirus 2 by RT PCR NEGATIVE NEGATIVE Final    Comment: (NOTE) SARS-CoV-2 target nucleic acids are NOT DETECTED.  The SARS-CoV-2 RNA is generally detectable in upper  respiratory specimens during the acute phase of infection. The lowest concentration of SARS-CoV-2 viral copies this assay can detect is 138 copies/mL. A negative result does not preclude SARS-Cov-2 infection and should not be used as the sole basis for treatment or other patient management decisions. A negative result may occur with  improper specimen collection/handling, submission of specimen other than nasopharyngeal swab, presence of viral mutation(s) within the areas targeted by this assay, and inadequate number of viral copies(<138 copies/mL). A negative result must be combined with clinical observations, patient history, and epidemiological information. The expected result is Negative.  Fact Sheet for Patients:  EntrepreneurPulse.com.au  Fact Sheet for Healthcare Providers:  IncredibleEmployment.be  This test is no t yet approved or cleared by the [place] FDA and  has been authorized for detection and/or diagnosis of SARS-CoV-2 by FDA under an Emergency Use  Authorization (EUA). This EUA will remain  in effect (meaning this test can be used) for the duration of the COVID-19 declaration under Section 564(b)(1) of the Act, 21 U.S.C.section 360bbb-3(b)(1), unless the authorization is terminated  or revoked sooner.       Influenza A by PCR NEGATIVE NEGATIVE Final   Influenza B by PCR NEGATIVE NEGATIVE Final    Comment: (NOTE) The Xpert Xpress SARS-CoV-2/FLU/RSV plus assay is intended as an aid in the diagnosis of influenza from Nasopharyngeal swab specimens and should not be used as a sole basis for treatment. Nasal washings and aspirates are unacceptable for Xpert Xpress SARS-CoV-2/FLU/RSV testing.  Fact Sheet for Patients: EntrepreneurPulse.com.au  Fact Sheet for Healthcare Providers: IncredibleEmployment.be  This test is not yet approved or cleared by the [place] FDA and has been  authorized for detection and/or diagnosis of SARS-CoV-2 by FDA under an Emergency Use Authorization (EUA). This EUA will remain in effect (meaning this test can be used) for the duration of the COVID-19 declaration under Section 564(b)(1) of the Act, 21 U.S.C. section 360bbb-3(b)(1), unless the authorization is terminated or revoked.  Performed at [facility], [address] [place], [place] [address]   Culture, blood (Routine x 2)     Status: None (Preliminary result)   Collection Time: 07/14/21 12:19 PM   Specimen: BLOOD  Result Value Ref Range Status   Specimen Description   Final    BLOOD RIGHT ANTECUBITAL Performed at [facility], [address], [place], [place] [address]    Special Requests   Final    BOTTLES DRAWN AEROBIC AND ANAEROBIC Blood Culture adequate volume Performed at [facility], [address] [place],  [address]    Culture  Setup Time   Final    CORRECTED RESULTS GRAM NEGATIVE RODS PREVIOUSLY REPORTED AS: GRAM NEGATIVE COCCI CORRECTED RESULTS CALLED TO: PHARMD [place] [phone] FCP ANAEROBIC BOTTLE ONLY    Culture GRAM NEGATIVE RODS  Final   Report Status PENDING  Incomplete  Blood Culture ID Panel (Reflexed)     Status: Abnormal   Collection Time: 07/14/21 12:19 PM  Result Value Ref Range Status   Enterococcus faecalis NOT DETECTED NOT DETECTED Final   Enterococcus Faecium NOT DETECTED NOT DETECTED Final   Listeria monocytogenes NOT DETECTED NOT DETECTED Final   Staphylococcus species NOT DETECTED NOT DETECTED Final   Staphylococcus aureus (BCID) NOT DETECTED NOT DETECTED Final   Staphylococcus epidermidis NOT DETECTED NOT DETECTED Final   Staphylococcus lugdunensis NOT DETECTED NOT DETECTED Final   Streptococcus species NOT DETECTED NOT DETECTED Final   Streptococcus agalactiae NOT DETECTED NOT DETECTED Final   Streptococcus pneumoniae NOT DETECTED NOT DETECTED Final   Streptococcus pyogenes NOT DETECTED  NOT DETECTED Final   A.calcoaceticus-baumannii NOT DETECTED NOT DETECTED Final   Bacteroides fragilis DETECTED (A) NOT DETECTED Final    Comment: RESULT CALLED TO, READ BACK BY AND VERIFIED WITH: NATHAN BELUE 07/15/21 @ 0042 BY SB    Enterobacterales DETECTED (A) NOT DETECTED Final    Comment: Enterobacterales represent a large order of gram negative bacteria, not a single organism. RESULT CALLED TO, READ BACK BY AND VERIFIED WITH: NATHAN BELUE 07/15/21 @ 0042 BY SB    Enterobacter cloacae complex NOT DETECTED NOT DETECTED Final   Escherichia coli DETECTED (A) NOT DETECTED Final    Comment: RESULT CALLED TO, READ BACK BY AND VERIFIED WITH: NATHAN BELUE 07/15/21 @ 0042 BY SB    Klebsiella aerogenes NOT DETECTED NOT DETECTED  Final   Klebsiella oxytoca NOT DETECTED NOT DETECTED Final   Klebsiella pneumoniae NOT DETECTED NOT DETECTED Final   Proteus species NOT DETECTED NOT DETECTED Final   Salmonella species NOT DETECTED NOT DETECTED Final   Serratia marcescens NOT DETECTED NOT DETECTED Final   Haemophilus influenzae NOT DETECTED NOT DETECTED Final   Neisseria meningitidis NOT DETECTED NOT DETECTED Final   Pseudomonas aeruginosa NOT DETECTED NOT DETECTED Final   Stenotrophomonas maltophilia NOT DETECTED NOT DETECTED Final   Candida albicans NOT DETECTED NOT DETECTED Final   Candida auris NOT DETECTED NOT DETECTED Final   Candida glabrata NOT DETECTED NOT DETECTED Final   Candida krusei NOT DETECTED NOT DETECTED Final   Candida parapsilosis NOT DETECTED NOT DETECTED Final   Candida tropicalis NOT DETECTED NOT DETECTED Final   Cryptococcus neoformans/gattii NOT DETECTED NOT DETECTED Final   CTX-M ESBL NOT DETECTED NOT DETECTED Final   Carbapenem resistance IMP NOT DETECTED NOT DETECTED Final   Carbapenem resistance KPC NOT DETECTED NOT DETECTED Final   Carbapenem resistance NDM NOT DETECTED NOT DETECTED Final   Carbapenem resist OXA 48 LIKE NOT DETECTED NOT DETECTED Final    Carbapenem resistance VIM NOT DETECTED NOT DETECTED Final    Comment: Performed at [facility], [address] [place], [place] [address]  Culture, blood (Routine x 2)     Status: None (Preliminary result)   Collection Time: 07/14/21  1:30 PM   Specimen: BLOOD  Result Value Ref Range Status   Specimen Description   Final    BLOOD LEFT ANTECUBITAL Performed at [facility], [address], [place], [place] [address]    Special Requests   Final    BOTTLES DRAWN AEROBIC AND ANAEROBIC Blood Culture adequate volume Performed at [facility], [place], [place] [address]    Culture  Setup Time   Final    CORRECTED RESULTS GRAM NEGATIVE RODS PREVIOUSLY REPORTED AS: GRAM NEGATIVE COCCI CORRECTED RESULTS CALLED TO: PHARMD MORGAN H [phone] FCP IN BOTH AEROBIC AND ANAEROBIC BOTTLES Performed at [facility], [place] [address], [place], [place] [address]    Culture GRAM NEGATIVE RODS  Final   Report Status PENDING  Incomplete  Surgical PCR screen     Status: Abnormal   Collection Time: 07/15/21  1:32 AM   Specimen: Nasal Mucosa; Nasal Swab  Result Value Ref Range Status   MRSA, PCR NEGATIVE NEGATIVE Final   Staphylococcus aureus POSITIVE (A) NEGATIVE Final    Comment: (NOTE) The Xpert SA Assay (FDA approved for NASAL specimens in patients 12 years of age and older), is one component of a comprehensive surveillance program. It is not intended to diagnose infection nor to guide or monitor treatment. Performed at [facility], [address], [place], [place] [address]          Radiology Studies: DG Chest 2 View  Result Date: 07/14/2021 CLINICAL DATA:  Suspected Sepsis, abdominal pain, nausea, vomiting and fever EXAM: CHEST - 2 VIEW COMPARISON:  01/30/2020 chest radiograph. FINDINGS: Stable cardiomediastinal silhouette with normal heart size. No pneumothorax. No pleural effusion. Lungs appear clear, with no acute  consolidative airspace disease and no pulmonary edema. IMPRESSION: No active cardiopulmonary disease. Electronically Signed   By: Ilona Sorrel M.D.   On: 07/14/2021 12:54   CT ABDOMEN PELVIS W CONTRAST  Result Date: 07/14/2021 CLINICAL DATA:  Abdominal pain, acute, nonlocalized EXAM: CT ABDOMEN AND PELVIS WITH CONTRAST TECHNIQUE: Multidetector CT imaging of the abdomen  and pelvis was performed using the standard protocol following bolus administration of intravenous contrast. CONTRAST:  150m OMNIPAQUE IOHEXOL 300 MG/ML  SOLN COMPARISON:  None. FINDINGS: Motion artifact is present. Lower chest: No acute abnormality. Hepatobiliary: No focal liver abnormality is seen. No gallstones, gallbladder wall thickening, or biliary dilatation. Pancreas: Unremarkable. Spleen: Unremarkable. Adrenals/Urinary Tract: Adrenals, kidneys, and partially distended bladder are unremarkable. Stomach/Bowel: Stomach is within normal limits. Bowel is normal in caliber. Fluid-filled, dilated appendix with surrounding fat infiltration. Vascular/Lymphatic: Atherosclerosis.  No enlarged lymph nodes. Reproductive: Uterus and bilateral adnexa are unremarkable. Other: No free air.  No abscess. Musculoskeletal: Lumbar spine degenerative changes. IMPRESSION: Acute appendicitis without complication. These results were called by telephone at the time of interpretation on 07/14/2021 at 2:07 pm to provider CARI TRIPLETT , who verbally acknowledged these results. Electronically Signed   By: PMacy MisM.D.   On: 07/14/2021 14:15        Scheduled Meds:  atenolol  50 mg Oral Daily   enoxaparin (LOVENOX) injection  40 mg Subcutaneous Q24H   gabapentin  300 mg Oral BID   insulin aspart  0-15 Units Subcutaneous TID WC   insulin aspart  0-5 Units Subcutaneous QHS   insulin glargine-yfgn  45 Units Subcutaneous Daily   living well with diabetes book- in spanish   Does not apply Once   mupirocin ointment  1 application Nasal BID    nortriptyline  20 mg Oral QHS   ondansetron (ZOFRAN) IV  4 mg Intravenous Once   pantoprazole  40 mg Oral Daily   Continuous Infusions:  sodium chloride 75 mL/hr at 07/16/21 0510   cefTRIAXone (ROCEPHIN)  IV     metronidazole 500 mg (07/16/21 0513)   sodium chloride       LOS: 2 days    Time spent: 30 mins     JWyvonnia Dusky MD [facility] Pager [phone]  If 7PM-7AM, please contact night-coverage 07/16/2021, 7:57 AM

## 2021-07-16 NOTE — Progress Notes (Signed)
°   07/16/21 1543  Assess: MEWS Score  Temp (!) 103.1 F (39.5 C) (RN Feliz Lincoln notified)  BP 133/62  Pulse Rate 99  Resp 20  Level of Consciousness Alert  SpO2 94 %  O2 Device Room Air  Patient Activity (if Appropriate) In bed  Assess: MEWS Score  MEWS Temp 2  MEWS Systolic 0  MEWS Pulse 0  MEWS RR 0  MEWS LOC 0  MEWS Score 2  MEWS Score Color Yellow  Assess: if the MEWS score is Yellow or Red  Were vital signs taken at a resting state? Yes  Focused Assessment No change from prior assessment  Does the patient meet 2 or more of the SIRS criteria? Yes  Does the patient have a confirmed or suspected source of infection? Yes  Provider and Rapid Response Notified? No  MEWS guidelines implemented *See Row Information* Yes  Treat  MEWS Interventions Administered prn meds/treatments  Take Vital Signs  Increase Vital Sign Frequency  Yellow: Q 2hr X 2 then Q 4hr X 2, if remains yellow, continue Q 4hrs  Escalate  MEWS: Escalate Yellow: discuss with charge nurse/RN and consider discussing with provider and RRT  Notify: Charge Nurse/RN  Name of Charge Nurse/RN Notified Alcario Drought Rn  Date Charge Nurse/RN Notified 07/16/21  Time Charge Nurse/RN Notified 1600  Notify: Provider  Provider Name/Title Williams  Date Provider Notified 07/16/21  Time Provider Notified 1600  Notification Type Page  Notification Reason Change in status  Provider response See new orders  Document  Patient Outcome Not stable and remains on department  Progress note created (see row info) Yes  Assess: SIRS CRITERIA  SIRS Temperature  1  SIRS Pulse 1  SIRS Respirations  0  SIRS WBC 0  SIRS Score Sum  2

## 2021-07-16 NOTE — Plan of Care (Signed)
°  Problem: Clinical Measurements: Goal: Ability to maintain clinical measurements within normal limits will improve Outcome: Progressing Goal: Will remain free from infection Outcome: Progressing Goal: Diagnostic test results will improve Outcome: Progressing Goal: Respiratory complications will improve Outcome: Progressing Goal: Cardiovascular complication will be avoided Outcome: Progressing   Problem: Elimination: Goal: Will not experience complications related to bowel motility Outcome: Progressing Goal: Will not experience complications related to urinary retention Outcome: Progressing   Problem: Pain Managment: Goal: General experience of comfort will improve Outcome: Progressing  Pt is involved in and agrees with the plan of care. V/S stable. Reports surgical pain on her abdomen; Morphine IV given with relief. Voiding well.

## 2021-07-16 NOTE — Anesthesia Postprocedure Evaluation (Signed)
Anesthesia Post Note  Patient: Nicole Villegas  Procedure(s) Performed: XI ROBOTIC LAPAROSCOPIC ASSISTED APPENDECTOMY  Patient location during evaluation: PACU Anesthesia Type: General Level of consciousness: awake and alert Pain management: pain level controlled Vital Signs Assessment: post-procedure vital signs reviewed and stable Respiratory status: spontaneous breathing, nonlabored ventilation, respiratory function stable and patient connected to nasal cannula oxygen Cardiovascular status: blood pressure returned to baseline and stable Postop Assessment: no apparent nausea or vomiting Anesthetic complications: no   No notable events documented.   Last Vitals:  Vitals:   07/16/21 0800 07/16/21 0851  BP: 128/69   Pulse: 79   Resp: 16   Temp: 36.9 C   SpO2: 97% 99%    Last Pain:  Vitals:   07/15/21 2149  TempSrc:   PainSc: Asleep                 Corinda Gubler

## 2021-07-17 ENCOUNTER — Other Ambulatory Visit: Payer: Self-pay

## 2021-07-17 LAB — CBC
HCT: 36.4 % (ref 36.0–46.0)
Hemoglobin: 12.4 g/dL (ref 12.0–15.0)
MCH: 28.4 pg (ref 26.0–34.0)
MCHC: 34.1 g/dL (ref 30.0–36.0)
MCV: 83.3 fL (ref 80.0–100.0)
Platelets: 141 10*3/uL — ABNORMAL LOW (ref 150–400)
RBC: 4.37 MIL/uL (ref 3.87–5.11)
RDW: 13 % (ref 11.5–15.5)
WBC: 6.4 10*3/uL (ref 4.0–10.5)
nRBC: 0 % (ref 0.0–0.2)

## 2021-07-17 LAB — CULTURE, BLOOD (ROUTINE X 2)
Special Requests: ADEQUATE
Special Requests: ADEQUATE

## 2021-07-17 LAB — GLUCOSE, CAPILLARY
Glucose-Capillary: 167 mg/dL — ABNORMAL HIGH (ref 70–99)
Glucose-Capillary: 221 mg/dL — ABNORMAL HIGH (ref 70–99)
Glucose-Capillary: 262 mg/dL — ABNORMAL HIGH (ref 70–99)
Glucose-Capillary: 245 mg/dL — ABNORMAL HIGH (ref 70–99)

## 2021-07-17 LAB — BASIC METABOLIC PANEL
Anion gap: 10 (ref 5–15)
BUN: 12 mg/dL (ref 6–20)
CO2: 23 mmol/L (ref 22–32)
Calcium: 8.4 mg/dL — ABNORMAL LOW (ref 8.9–10.3)
Chloride: 99 mmol/L (ref 98–111)
Creatinine, Ser: 0.51 mg/dL (ref 0.44–1.00)
GFR, Estimated: >60 mL/min (ref >60)
Glucose, Bld: 186 mg/dL — ABNORMAL HIGH (ref 70–99)
Potassium: 3.5 mmol/L (ref 3.5–5.1)
Sodium: 132 mmol/L — ABNORMAL LOW (ref 135–145)

## 2021-07-17 MED ORDER — DOCUSATE SODIUM 100 MG PO CAPS: 100.0000 mg | ORAL_CAPSULE | Freq: Two times a day (BID) | ORAL | 0 refills | Status: AC | PRN

## 2021-07-17 MED ORDER — ACETAMINOPHEN 325 MG PO TABS: 650.0000 mg | ORAL_TABLET | Freq: Three times a day (TID) | ORAL | 0 refills | Status: AC | PRN

## 2021-07-17 MED ORDER — IBUPROFEN 800 MG PO TABS: 800.0000 mg | ORAL_TABLET | Freq: Three times a day (TID) | ORAL | 0 refills | Status: DC | PRN

## 2021-07-17 MED ORDER — HYDROCODONE-ACETAMINOPHEN 5-325 MG PO TABS
1.0000 | ORAL_TABLET | Freq: Four times a day (QID) | ORAL | 0 refills | Status: DC | PRN
Start: 2021-07-17 — End: 2021-07-18

## 2021-07-17 MED ORDER — INSULIN ASPART 100 UNIT/ML IJ SOLN
5.0000 [IU] | Freq: Three times a day (TID) | INTRAMUSCULAR | Status: DC
  Administered 2021-07-17 – 2021-07-18 (×5): 5 [IU] via SUBCUTANEOUS
  Filled 2021-07-17 (×3): qty 1

## 2021-07-17 NOTE — Evaluation (Signed)
Physical Therapy Evaluation Patient Details Name: Nicole Villegas MRN: [id number] DOB: July 30, 1965 Today's Date: 07/17/2021  History of Present Illness  Patient is a 55 year old female who reports to [facility] ED on 07/14/21 for abdominal pain (RLQ) and n/v/d for 2 days. Appendectomy performed on 07/15/21. Patient has a PMH of diabetes, hyperlipidemia, as well as hypertension.  Clinical Impression  Patient tolerated session well and was agreeable to treatment. Interpreter Kandis Cocking present throughout session. Upon arrival patient was supine in bed with HOB elevated and son present. Per patient and son, patient was independent with ADLs and mobility prior to hospitalization. HR ranged from 103-123 throughout session, 98% O2 on room air throughout session. Patient required Min A to get supine to sitting with HHA and assistance at trunk due to pain, however completed sit to supine at supervision. Sit to stand and ambulation completed at supervision (occasionally CGA during ambulation due to patient requiring tactile cueing from hallway railing for stability). Mild dizziness reported following ambulation, however went away shortly after sitting EOB. Per son ambulation looked near baseline. Patient would benefit from continued skilled physical therapy in order to optimize return to patient's prior level of function. Recommend HHPT upon discharge from acute hospitalization.    Recommendations for follow up therapy are one component of a multi-disciplinary discharge planning process, led by the attending physician.  Recommendations may be updated based on patient status, additional functional criteria and insurance authorization.  Follow Up Recommendations Home health PT    Assistance Recommended at Discharge PRN  Functional Status Assessment Patient has had a recent decline in their functional status and demonstrates the ability to make significant improvements in function in a reasonable and predictable amount of  time.  Equipment Recommendations  None recommended by PT    Recommendations for Other Services       Precautions / Restrictions Precautions Precautions: Fall Restrictions Weight Bearing Restrictions: No      Mobility  Bed Mobility Overal bed mobility: Needs Assistance Bed Mobility: Supine to Sit;Sit to Supine     Supine to sit: Min assist (assistance at trunk and HHA due to pain) Sit to supine: Supervision (increased time and effort)        Transfers Overall transfer level: Needs assistance   Transfers: Sit to/from Stand Sit to Stand: Supervision (mild unsteadiness)                Ambulation/Gait Ambulation/Gait assistance: Min guard;Supervision Gait Distance (Feet): 200 Feet Assistive device: None Gait Pattern/deviations: Step-through pattern;Decreased step length - right;Decreased step length - left;Narrow base of support Gait velocity: decreased     General Gait Details: mild-moderate unsteadiness noted; intermittent tactile cueing from hallway rails for stability; patient reported mild dizziness upon completion however went away after ~30sec-minute; son reported gait was slightly unsteady compared to baseline however looked near baseline to him  Careers information officer    Modified Rankin (Stroke Patients Only)       Balance Overall balance assessment: Needs assistance Sitting-balance support: No upper extremity supported;Feet supported Sitting balance-Leahy Scale: Good Sitting balance - Comments: can reach outside BOS with no LOB   Standing balance support: No upper extremity supported;During functional activity Standing balance-Leahy Scale: Fair Standing balance comment: see gait comments for details                             Pertinent Vitals/Pain Pain Assessment: 0-10  Pain Score: 8  Pain Descriptors / Indicators: Discomfort;Dull Pain Intervention(s): Limited activity within patient's tolerance;Monitored  during session;Repositioned    Home Living Family/patient expects to be discharged to:: Private residence Living Arrangements: Children;Spouse/significant other (Son and husband, and daughter home from collage) Available Help at Discharge: Family;Friend(s);Available PRN/intermittently Type of Home: House Home Access: Stairs to enter Entrance Stairs-Rails: None Entrance Stairs-Number of Steps: 2   Home Layout: One level Home Equipment: Grab bars - tub/shower      Prior Function Prior Level of Function : Independent/Modified Independent             Mobility Comments: Independent w/ all mobility and ADLs       Hand Dominance   Dominant Hand: Right    Extremity/Trunk Assessment   Upper Extremity Assessment Upper Extremity Assessment: Defer to OT evaluation;Generalized weakness    Lower Extremity Assessment Lower Extremity Assessment: Generalized weakness (grossly 3-/5 strength bilaterally)       Communication   Communication: Prefers language other than English  Cognition Arousal/Alertness: Awake/alert Behavior During Therapy: WFL for tasks assessed/performed Overall Cognitive Status: Within Functional Limits for tasks assessed                                 General Comments: A&O to place, year, month        General Comments      Exercises Other Exercises Other Exercises: patient educated on roll of pt/ discharge plan/ fall risk   Assessment/Plan    PT Assessment Patient needs continued PT services  PT Problem List Decreased mobility;Decreased strength;Decreased activity tolerance;Decreased balance;Decreased safety awareness;Decreased knowledge of precautions       PT Treatment Interventions Therapeutic activities;Therapeutic exercise;Balance training;Stair training;Functional mobility training    PT Goals (Current goals can be found in the Care Plan section)  Acute Rehab PT Goals Patient Stated Goal: to go home PT Goal Formulation:  With patient/family Time For Goal Achievement: 07/31/21 Potential to Achieve Goals: Good    Frequency Min 2X/week   Barriers to discharge        Co-evaluation               AM-PAC PT "6 Clicks" Mobility  Outcome Measure Help needed turning from your back to your side while in a flat bed without using bedrails?: A Little Help needed moving from lying on your back to sitting on the side of a flat bed without using bedrails?: A Little Help needed moving to and from a bed to a chair (including a wheelchair)?: A Little Help needed standing up from a chair using your arms (e.g., wheelchair or bedside chair)?: A Little Help needed to walk in hospital room?: A Little Help needed climbing 3-5 steps with a railing? : A Little 6 Click Score: 18    End of Session Equipment Utilized During Treatment: Gait belt Activity Tolerance: Patient tolerated treatment well;Patient limited by pain Patient left: in bed;with call bell/phone within reach;with family/visitor present;with bed alarm set Nurse Communication: Mobility status PT Visit Diagnosis: Unsteadiness on feet (R26.81);Muscle weakness (generalized) (M62.81);Difficulty in walking, not elsewhere classified (R26.2)    Time: 6387-5643 PT Time Calculation (min) (ACUTE ONLY): 28 min   Charges:   PT Evaluation $PT Eval Low Complexity: 1 Low PT Treatments $Gait Training: 8-22 mins        Angelica Ran, PT  07/17/21. 10:07 AM

## 2021-07-17 NOTE — Evaluation (Signed)
Occupational Therapy Evaluation Patient Details Name: Nicole Villegas MRN: [id number] DOB: 11-28-1965 Today's Date: 07/17/2021   History of Present Illness Patient is a 55 year old female who [facility] to [facility] ED on 07/14/21 for abdominal pain (RLQ) and n/v/d for 2 days. Appendectomy performed on 07/15/21. Patient has a PMH of diabetes, hyperlipidemia, as well as hypertension.   Clinical Impression   Pt seen for OT evaluation this date in setting of acute hospitalization s/p appendectomy. She presents this date with JP drain and reports feeling much better. Her spouse and daughter are present in the room. Pt reports being INDEP at baseline. She is educated on log rolling to reduce pain with OOB Activity and is able to perform side-lying to sit with SUPV. She demos G control for CTS with no AD with SUPV, some light sway on initial stand, but progresses to G balance with ~2-3 mins of standing/walking in the room with SUPV. Seated EOB, pt requires increased time, but is able to perform LB ADLs with MOD I. She is near her functional baseline and has good family support. Do not currently anticipate further OT needs at this time.      Recommendations for follow up therapy are one component of a multi-disciplinary discharge planning process, led by the attending physician.  Recommendations may be updated based on patient status, additional functional criteria and insurance authorization.   Follow Up Recommendations  No OT follow up    Assistance Recommended at Discharge Intermittent Supervision/Assistance  Functional Status Assessment  Patient has not had a recent decline in their functional status  Equipment Recommendations  Tub/shower seat    Recommendations for Other Services       Precautions / Restrictions Precautions Precautions: Fall Restrictions Weight Bearing Restrictions: No      Mobility Bed Mobility Overal bed mobility: Modified Independent       Supine to sit: Modified  independent (Device/Increase time) Sit to supine: Supervision   General bed mobility comments: increased time    Transfers Overall transfer level: Needs assistance   Transfers: Sit to/from Stand Sit to Stand: Supervision           General transfer comment: no AD, good control      Balance Overall balance assessment: Needs assistance Sitting-balance support: No upper extremity supported;Feet supported Sitting balance-Leahy Scale: Good Sitting balance - Comments: G static and dynamic sitting   Standing balance support: No upper extremity supported;During functional activity Standing balance-Leahy Scale: Fair Standing balance comment: minimally unsteady on initial stand, but able to progress to G balance after ~2 mins standing and taking steps in the room                           ADL either performed or assessed with clinical judgement   ADL Overall ADL's : Modified independent;At baseline                                             Vision Patient Visual Report: No change from baseline       Perception     Praxis      Pertinent Vitals/Pain Pain Assessment: 0-10 Pain Score: 6  Pain Location: L side Pain Descriptors / Indicators: Discomfort;Dull Pain Intervention(s): Limited activity within patient's tolerance;Monitored during session     Hand Dominance Right   Extremity/Trunk Assessment Upper Extremity Assessment  Upper Extremity Assessment: Overall WFL for tasks assessed   Lower Extremity Assessment Lower Extremity Assessment: Generalized weakness       Communication Communication Communication: Prefers language other than English   Cognition Arousal/Alertness: Awake/alert Behavior During Therapy: WFL for tasks assessed/performed Overall Cognitive Status: Within Functional Limits for tasks assessed                                 General Comments: A&O x4     General Comments       Exercises Other  Exercises Other Exercises: OT ed with pt/family re: role of OT   Shoulder Instructions      Home Living Family/patient expects to be discharged to:: Private residence Living Arrangements: Children;Spouse/significant other Available Help at Discharge: Family;Friend(s);Available PRN/intermittently Type of Home: House Home Access: Stairs to enter [employer] of Steps: 2 Entrance Stairs-Rails: None Home Layout: One level     Bathroom Shower/Tub: Chief Strategy Officer: Standard     Home Equipment: Grab bars - tub/shower          Prior Functioning/Environment Prior Level of Function : Independent/Modified Independent             Mobility Comments: Independent w/ all mobility and ADLs          OT Problem List: Decreased activity tolerance      OT Treatment/Interventions: Self-care/ADL training;Therapeutic activities    OT Goals(Current goals can be found in the care plan section) Acute Rehab OT Goals Patient Stated Goal: to go home OT Goal Formulation: All assessment and education complete, DC therapy  OT Frequency:     Barriers to D/C:            Co-evaluation              AM-PAC OT "6 Clicks" Daily Activity     Outcome Measure Help from another person eating meals?: None Help from another person taking care of personal grooming?: None Help from another person toileting, which includes using toliet, bedpan, or urinal?: None Help from another person bathing (including washing, rinsing, drying)?: A Little (supv for safety) Help from another person to put on and taking off regular upper body clothing?: None Help from another person to put on and taking off regular lower body clothing?: None 6 Click Score: 23   End of Session Equipment Utilized During Treatment: Gait belt Nurse Communication: Mobility status  Activity Tolerance: Patient tolerated treatment well Patient left: in bed;with call bell/phone within reach;with  family/visitor present  OT Visit Diagnosis: Muscle weakness (generalized) (M62.81)                Time: 8325-4982 OT Time Calculation (min): 9 min Charges:  OT General Charges $OT Visit: 1 Visit OT Evaluation $OT Eval Low Complexity: 1 Low  Rejeana Brock, MS, OTR/L ascom [phone] 07/17/21, 5:59 PM

## 2021-07-17 NOTE — Progress Notes (Signed)
Subjective:  CC: Nicole Villegas is a 55 y.o. female  Hospital stay day 3, 2 Days Post-Op robo lap appy  HPI: No issues. BM.  Tolerating regular diet  ROS:  General: Denies weight loss, weight gain, fatigue, fevers, chills, and night sweats. Heart: Denies chest pain, palpitations, racing heart, irregular heartbeat, leg pain or swelling, and decreased activity tolerance. Respiratory: Denies breathing difficulty, shortness of breath, wheezing, cough, and sputum. GI: Denies change in appetite, heartburn, nausea, vomiting, constipation, diarrhea, and blood in stool. GU: Denies difficulty urinating, pain with urinating, urgency, frequency, blood in urine.   Objective:   Temp:  [97.8 F (36.6 C)-103.1 F (39.5 C)] 99.3 F (37.4 C) (12/30 0754) Pulse Rate:  [93-100] 98 (12/30 0754) Resp:  [16-24] 20 (12/30 0754) BP: (105-169)/(60-88) 132/78 (12/30 0754) SpO2:  [94 %-98 %] 97 % (12/30 0754)     Height: 5' 0.98" (154.9 cm) Weight: 65.1 kg BMI (Calculated): 27.15   Intake/Output this shift:   Intake/Output Summary (Last 24 hours) at 07/17/2021 0856 Last data filed at 07/16/2021 1849 Gross per 24 hour  Intake 1273.28 ml  Output --  Net 1273.28 ml    Constitutional :  alert, cooperative, appears stated age, and no distress  Respiratory:  clear to auscultation bilaterally  Cardiovascular:  regular rate and rhythm  Gastrointestinal: Soft, no guarding, focal TTP RLQ as expected, improved. JP with serosanguinous drainage .   Skin: Cool and moist. Staples c/d/i  Psychiatric: Normal affect, non-agitated, not confused       LABS:  CMP Latest Ref Rng & Units 07/17/2021 07/16/2021 07/15/2021  Glucose 70 - 99 mg/dL 937(J) 696(V) 893(Y)  BUN 6 - 20 mg/dL 12 17 19   Creatinine 0.44 - 1.00 mg/dL 1.01 7.51  Sodium 135 - 145 mmol/L 132(L) 136 135  Potassium 3.5 - 5.1 mmol/L 3.5 3.8 3.5  Chloride 98 - 111 mmol/L 99 105 103  CO2 22 - 32 mmol/L 23 22 24   Calcium 8.9 - 10.3 mg/dL 0.25) 8.3(L)  8.4(L)  Total Protein 6.5 - 8.1 g/dL - - -  Total Bilirubin 0.3 - 1.2 mg/dL - - -  Alkaline Phos 38 - 126 U/L - - -  AST 15 - 41 U/L - - -  ALT 0 - 44 U/L - - -   CBC Latest Ref Rng & Units 07/17/2021 07/16/2021 07/15/2021  WBC 4.0 - 10.5 K/uL 6.4 7.2 8.1  Hemoglobin 12.0 - 15.0 g/dL 07/18/2021 10.9(L) 12.2  Hematocrit 36.0 - 46.0 % 36.4 31.5(L) 35.0(L)  Platelets 150 - 400 K/uL 141(L) 124(L) 146(L)    RADS: N/a Assessment:   ok to d/c from my standpoint with drain in place.  honeycomb dressing can come off prior to d/c.  keep drain in place until followup with me in one week.  ok to shower.  UTI and DM, septicemia- continure care per hospitalist

## 2021-07-17 NOTE — Progress Notes (Signed)
Inpatient Diabetes Program Recommendations  AACE/ADA: New Consensus Statement on Inpatient Glycemic Control   Target Ranges:  Prepandial:   less than 140 mg/dL      Peak postprandial:   less than 180 mg/dL (1-2 hours)      Critically ill patients:  140 - 180 mg/dL    Latest Reference Range & Units 07/16/21 08:01 07/16/21 11:42 07/16/21 17:05 07/16/21 20:40  Glucose-Capillary 70 - 99 mg/dL 096 (H) 283 (H) 662 (H) 243 (H)   Review of Glycemic Control  Diabetes history: DM2 Outpatient Diabetes medications: Levemir 60 units QHS, Novolog 50 units BID (with lunch and supper), Glipizide XL 10 mg BID, Janumet 50-1000 mg BID, Ozempic Qweek (Monday) Current orders for Inpatient glycemic control: Semglee 45 units daily, Novolog 0-15 units TID with meals, Novolog 0-5 units QHS   Inpatient Diabetes Program Recommendations:    Insulin: Please consider ordering Novolog 4 units TID with meals for meal coverage if patient eats at least 50% of meals.  Thanks, Orlando Penner, RN, MSN, CDE Diabetes Coordinator Inpatient Diabetes Program [phone] (Team Pager from 8am to 5pm)

## 2021-07-17 NOTE — Progress Notes (Signed)
PROGRESS NOTE    Nicole Villegas  WNU:[id number] DOB: [birth year] DOA: 07/14/2021 PCP: Kerri Perches, PA-C   Assessment & Plan:   Principal Problem:   Acute appendicitis Active Problems:   UTI (urinary tract infection)   Type II diabetes mellitus with renal manifestations ([place])   CKD (chronic kidney disease), stage II   HLD (hyperlipidemia)   Hypertension   GERD (gastroesophageal reflux disease)   Depression   Severe sepsis ([place])   Acute appendicitis: s/p lap appendectomy 07/15/21 as per general surg. CT scan showed acute appendicitis without complications.  Spiked a fever to 103 yesterday. Continue on IV rocephin, flagyl. Morphine, percocet prn   Bacteremia: blood cxs growing e.coli and bacteroides fragilis. Continue on IV rocephin, flagyl. Repeat blood cxs NGTD    UTI: urine cx growing e.coli.  Continue on IV abxs    Severe sepsis:  due to UTI, acute appendicitis & bacteremia. Met cirtieria w/ tachycardia, fever, elevated lactic acid & above infections. Sepsis resolved   Thrombocytopenia: etiology unclear, labile. Will continue to monitor    DM2: HbA1c 12.8, poorly controlled. Continue on glargine, SSI w/ accuchecks    CKDII: Baseline creatinine 0.7 on 05/22/2020. Cr is at baseline    HLD: not taking a statin currently   HTN: continue on home dose of atenolol. Continue to hold home dose of amlodipine, prinzide   GERD: continue on PPI    Depression: severity unknown. Continue on home dose of nortriptyline   DVT prophylaxis: lovenox  Code Status: full  Family Communication: discussed pt's care w/ pt's family at bedside and answered their questions  Disposition Plan: likely d/c back home   Level of care: Med-Surg  Status is: Inpatient  Remains inpatient appropriate because: severity of illness, still requiring IV abxs, repeat blood cxs ordered today    Consultants:  General surg   Procedures:  Antimicrobials: flagyl, rocephin    Subjective: Pt c/o fatigue    Objective: Vitals:   07/16/21 2023 07/16/21 2354 07/17/21 0045 07/17/21 0432  BP: (!) 153/77 (!) 169/88  132/88  Pulse: 93 100  93  Resp: (!) _0 Temp: (!) 102 F (38.9 C) (!) 101.6 F (38.7 C) 97.8 F (36.6 C) 98.8 F (37.1 C)  TempSrc: Oral Oral Rectal Oral  SpO2: 98% 96%  97%  Weight:      Height:        Intake/Output Summary (Last 24 hours) at 07/17/2021 0747 Last data filed at 07/16/2021 1849 Gross per 24 hour  Intake 1273.28 ml  Output 400 ml  Net 873.28 ml   Filed Weights   07/14/21 1206 07/14/21 2216  Weight: 73.5 kg 65.1 kg    Examination:  General exam: Appears calm & comfortable  Respiratory system: clear breath sounds b/l  Cardiovascular system: S1 & S2+. No rubs or gallops Gastrointestinal system: Abd is soft, NT, ND & hypoactive bowel sounds  Central nervous system: alert and oriented. Moves all extremities  Psychiatry: Judgement and insight appears normal. Flat mood and affect     Data Reviewed: I have personally reviewed following labs and imaging studies  CBC: Recent Labs  Lab 07/14/21 1219 07/15/21 0539 07/16/21 0450 07/17/21 0530  WBC 7.2 8.1 7.2 6.4  NEUTROABS 6.7  --   --   --   HGB 15.4* 12.2 10.9* 12.4  HCT 45.0 35.0* 31.5* 36.4  MCV 83.5 82.7 83.6 83.3  PLT 237 146* 124* 034*   Basic Metabolic Panel: Recent Labs  Lab 07/14/21  1219 07/15/21 0539 07/16/21 0450 07/17/21 0530  NA 126* 135 136 132*  K 4.1 3.5 3.8 3.5  CL 88* 103 105 99  CO2 _0 GLUCOSE 522* 197* 204* 186*  BUN _1 CREATININE 1.12* 0.79 0.76 0.51  CALCIUM 9.5 8.4* 8.3* 8.4*   GFR: Estimated Creatinine Clearance: 68.6 mL/min (by C-G formula based on SCr of 0.51 mg/dL). Liver Function Tests: Recent Labs  Lab 07/14/21 1219  AST 31  ALT 15  ALKPHOS 95  BILITOT 1.5*  PROT 8.3*  ALBUMIN 4.1   No results for input(s): LIPASE, AMYLASE in the last 168 hours. No results for input(s): AMMONIA in the last 168 hours. Coagulation  Profile: Recent Labs  Lab 07/14/21 1219  INR 1.1   Cardiac Enzymes: No results for input(s): CKTOTAL, CKMB, CKMBINDEX, TROPONINI in the last 168 hours. BNP (last 3 results) No results for input(s): PROBNP in the last 8760 hours. HbA1C: No results for input(s): HGBA1C in the last 72 hours. CBG: Recent Labs  Lab 07/15/21 2131 07/16/21 0801 07/16/21 1142 07/16/21 1705 07/16/21 2040  GLUCAP 269* 174* 228* 258* 243*   Lipid Profile: No results for input(s): CHOL, HDL, LDLCALC, TRIG, CHOLHDL, LDLDIRECT in the last 72 hours. Thyroid Function Tests: No results for input(s): TSH, T4TOTAL, FREET4, T3FREE, THYROIDAB in the last 72 hours. Anemia Panel: No results for input(s): VITAMINB12, FOLATE, FERRITIN, TIBC, IRON, RETICCTPCT in the last 72 hours. Sepsis Labs: Recent Labs  Lab 07/14/21 1219 07/14/21 1727  PROCALCITON 8.40  --   LATICACIDVEN 7.1* 5.9*    Recent Results (from the past 240 hour(s))  Urine Culture     Status: Abnormal   Collection Time: 07/14/21 11:39 AM   Specimen: Urine, Random  Result Value Ref Range Status   Specimen Description   Final    URINE, RANDOM Performed at [facility], [address], [place], [place] [address]    Special Requests   Final    NONE Performed at [facility], [address] [place], [place] [address]    Culture >=100,000 COLONIES/mL ESCHERICHIA COLI (A)  Final   Report Status 07/16/2021 FINAL  Final   Organism ID, Bacteria ESCHERICHIA COLI (A)  Final      Susceptibility   Escherichia coli - MIC*    AMPICILLIN >=32 RESISTANT Resistant     CEFAZOLIN <=4 SENSITIVE Sensitive     CEFEPIME <=0.12 SENSITIVE Sensitive     CEFTRIAXONE <=0.25 SENSITIVE Sensitive     CIPROFLOXACIN >=4 RESISTANT Resistant     GENTAMICIN <=1 SENSITIVE Sensitive     IMIPENEM <=0.25 SENSITIVE Sensitive     NITROFURANTOIN <=16 SENSITIVE Sensitive     TRIMETH/SULFA <=20 SENSITIVE Sensitive     AMPICILLIN/SULBACTAM 16 INTERMEDIATE  Intermediate     PIP/TAZO <=4 SENSITIVE Sensitive     * >=100,000 COLONIES/mL ESCHERICHIA COLI  Resp Panel by RT-PCR (Flu A&B, Covid) Nasopharyngeal Swab     Status: None   Collection Time: 07/14/21 12:19 PM   Specimen: Nasopharyngeal Swab; Nasopharyngeal(NP) swabs in vial transport medium  Result Value Ref Range Status   SARS Coronavirus 2 by RT PCR NEGATIVE NEGATIVE Final    Comment: (NOTE) SARS-CoV-2 target nucleic acids are NOT DETECTED.  The SARS-CoV-2 RNA is generally detectable in upper respiratory specimens during the acute phase of infection. The lowest concentration of SARS-CoV-2 viral copies this assay can detect is 138 copies/mL. A negative result does not preclude SARS-Cov-2 infection and should not be  used as the sole basis for treatment or other patient management decisions. A negative result may occur with  improper specimen collection/handling, submission of specimen other than nasopharyngeal swab, presence of viral mutation(s) within the areas targeted by this assay, and inadequate number of viral copies(<138 copies/mL). A negative result must be combined with clinical observations, patient history, and epidemiological information. The expected result is Negative.  Fact Sheet for Patients:  EntrepreneurPulse.com.au  Fact Sheet for Healthcare Providers:  IncredibleEmployment.be  This test is no t yet approved or cleared by the [place] FDA and  has been authorized for detection and/or diagnosis of SARS-CoV-2 by FDA under an Emergency Use Authorization (EUA). This EUA will remain  in effect (meaning this test can be used) for the duration of the COVID-19 declaration under Section 564(b)(1) of the Act, 21 U.S.C.section 360bbb-3(b)(1), unless the authorization is terminated  or revoked sooner.       Influenza A by PCR NEGATIVE NEGATIVE Final   Influenza B by PCR NEGATIVE NEGATIVE Final    Comment: (NOTE) The Xpert Xpress  SARS-CoV-2/FLU/RSV plus assay is intended as an aid in the diagnosis of influenza from Nasopharyngeal swab specimens and should not be used as a sole basis for treatment. Nasal washings and aspirates are unacceptable for Xpert Xpress SARS-CoV-2/FLU/RSV testing.  Fact Sheet for Patients: EntrepreneurPulse.com.au  Fact Sheet for Healthcare Providers: IncredibleEmployment.be  This test is not yet approved or cleared by the [place] FDA and has been authorized for detection and/or diagnosis of SARS-CoV-2 by FDA under an Emergency Use Authorization (EUA). This EUA will remain in effect (meaning this test can be used) for the duration of the COVID-19 declaration under Section 564(b)(1) of the Act, 21 U.S.C. section 360bbb-3(b)(1), unless the authorization is terminated or revoked.  Performed at [facility], [address] [place], [place] [address]   Culture, blood (Routine x 2)     Status: Abnormal (Preliminary result)   Collection Time: 07/14/21 12:19 PM   Specimen: BLOOD  Result Value Ref Range Status   Specimen Description   Final    BLOOD RIGHT ANTECUBITAL Performed at [facility], [address], [place], [place] [address]    Special Requests   Final    BOTTLES DRAWN AEROBIC AND ANAEROBIC Blood Culture adequate volume Performed at [facility], [address] [place], [place] [address]    Culture  Setup Time   Final    CORRECTED RESULTS GRAM NEGATIVE RODS PREVIOUSLY REPORTED AS: GRAM NEGATIVE COCCI CORRECTED RESULTS CALLED TO: PHARMD MORGAN H [id number] FCP ANAEROBIC BOTTLE ONLY    Culture (A)  Final    ESCHERICHIA COLI SUSCEPTIBILITIES TO FOLLOW Performed at [facility], [place] [address] [place], [place] [address]    Report Status PENDING  Incomplete  Blood Culture ID Panel (Reflexed)     Status: Abnormal   Collection Time: 07/14/21 12:19 PM  Result Value Ref Range Status   Enterococcus  faecalis NOT DETECTED NOT DETECTED Final   Enterococcus Faecium NOT DETECTED NOT DETECTED Final   Listeria monocytogenes NOT DETECTED NOT DETECTED Final   Staphylococcus species NOT DETECTED NOT DETECTED Final   Staphylococcus aureus (BCID) NOT DETECTED NOT DETECTED Final   Staphylococcus epidermidis NOT DETECTED NOT DETECTED Final   Staphylococcus lugdunensis NOT DETECTED NOT DETECTED Final   Streptococcus species NOT DETECTED NOT DETECTED Final   Streptococcus agalactiae NOT DETECTED NOT DETECTED Final   Streptococcus pneumoniae NOT DETECTED NOT DETECTED Final   Streptococcus pyogenes  NOT DETECTED NOT DETECTED Final   A.calcoaceticus-baumannii NOT DETECTED NOT DETECTED Final   Bacteroides fragilis DETECTED (A) NOT DETECTED Final    Comment: RESULT CALLED TO, READ BACK BY AND VERIFIED WITH: NATHAN BELUE 07/15/21 @ 0042 BY SB    Enterobacterales DETECTED (A) NOT DETECTED Final    Comment: Enterobacterales represent a large order of gram negative bacteria, not a single organism. RESULT CALLED TO, READ BACK BY AND VERIFIED WITH: NATHAN BELUE 07/15/21 @ 0042 BY SB    Enterobacter cloacae complex NOT DETECTED NOT DETECTED Final   Escherichia coli DETECTED (A) NOT DETECTED Final    Comment: RESULT CALLED TO, READ BACK BY AND VERIFIED WITH: NATHAN BELUE 07/15/21 @ 0042 BY SB    Klebsiella aerogenes NOT DETECTED NOT DETECTED Final   Klebsiella oxytoca NOT DETECTED NOT DETECTED Final   Klebsiella pneumoniae NOT DETECTED NOT DETECTED Final   Proteus species NOT DETECTED NOT DETECTED Final   Salmonella species NOT DETECTED NOT DETECTED Final   Serratia marcescens NOT DETECTED NOT DETECTED Final   Haemophilus influenzae NOT DETECTED NOT DETECTED Final   Neisseria meningitidis NOT DETECTED NOT DETECTED Final   Pseudomonas aeruginosa NOT DETECTED NOT DETECTED Final   Stenotrophomonas maltophilia NOT DETECTED NOT DETECTED Final   Candida albicans NOT DETECTED NOT DETECTED Final   Candida auris  NOT DETECTED NOT DETECTED Final   Candida glabrata NOT DETECTED NOT DETECTED Final   Candida krusei NOT DETECTED NOT DETECTED Final   Candida parapsilosis NOT DETECTED NOT DETECTED Final   Candida tropicalis NOT DETECTED NOT DETECTED Final   Cryptococcus neoformans/gattii NOT DETECTED NOT DETECTED Final   CTX-M ESBL NOT DETECTED NOT DETECTED Final   Carbapenem resistance IMP NOT DETECTED NOT DETECTED Final   Carbapenem resistance KPC NOT DETECTED NOT DETECTED Final   Carbapenem resistance NDM NOT DETECTED NOT DETECTED Final   Carbapenem resist OXA 48 LIKE NOT DETECTED NOT DETECTED Final   Carbapenem resistance VIM NOT DETECTED NOT DETECTED Final    Comment: Performed at [facility], [address] [place], [place] [address]  Culture, blood (Routine x 2)     Status: None (Preliminary result)   Collection Time: 07/14/21  1:30 PM   Specimen: BLOOD  Result Value Ref Range Status   Specimen Description   Final    BLOOD LEFT ANTECUBITAL Performed at [facility], [address] [place], [place] [address]    Special Requests   Final    BOTTLES DRAWN AEROBIC AND ANAEROBIC Blood Culture adequate volume Performed at [facility], [address] [place], [place] [address]    Culture  Setup Time   Final    CORRECTED RESULTS GRAM NEGATIVE RODS PREVIOUSLY REPORTED AS: GRAM NEGATIVE COCCI CORRECTED RESULTS CALLED TO: PHARMD MORGAN H [id number] FCP IN BOTH AEROBIC AND ANAEROBIC BOTTLES Performed at [facility], [place] [address] [place], [place] [address]    Culture GRAM NEGATIVE RODS  Final   Report Status PENDING  Incomplete  Surgical PCR screen     Status: Abnormal   Collection Time: 07/15/21  1:32 AM   Specimen: Nasal Mucosa; Nasal Swab  Result Value Ref Range Status   MRSA, PCR NEGATIVE NEGATIVE Final   Staphylococcus aureus POSITIVE (A) NEGATIVE Final    Comment: (NOTE) The Xpert SA Assay (FDA approved for NASAL specimens in patients 28 years of  age and older), is one component of a comprehensive surveillance program. It is not intended to diagnose infection nor to  guide or monitor treatment. Performed at [facility], [address] [place], [place] [address]   CULTURE, BLOOD (ROUTINE X 2) w Reflex to ID Panel     Status: None (Preliminary result)   Collection Time: 07/16/21 10:40 AM   Specimen: BLOOD  Result Value Ref Range Status   Specimen Description BLOOD BLOOD LEFT HAND  Final   Special Requests   Final    BOTTLES DRAWN AEROBIC AND ANAEROBIC Blood Culture results may not be optimal due to an inadequate volume of blood received in culture bottles   Culture   Final    NO GROWTH < 24 HOURS Performed at [facility], [address] [place], [place] [address]    Report Status PENDING  Incomplete  CULTURE, BLOOD (ROUTINE X 2) w Reflex to ID Panel     Status: None (Preliminary result)   Collection Time: 07/16/21 10:40 AM   Specimen: BLOOD  Result Value Ref Range Status   Specimen Description BLOOD BLOOD RIGHT HAND  Final   Special Requests   Final    BOTTLES DRAWN AEROBIC AND ANAEROBIC Blood Culture results may not be optimal due to an inadequate volume of blood received in culture bottles   Culture   Final    NO GROWTH < 24 HOURS Performed at [facility], [address], [place], [place] [address]    Report Status PENDING  Incomplete         Radiology Studies: No results found.      Scheduled Meds:  atenolol  50 mg Oral Daily   enoxaparin (LOVENOX) injection  40 mg Subcutaneous Q24H   gabapentin  300 mg Oral BID   insulin aspart  0-15 Units Subcutaneous TID WC   insulin aspart  0-5 Units Subcutaneous QHS   insulin glargine-yfgn  45 Units Subcutaneous Daily   living well with diabetes book- in spanish   Does not apply Once   mupirocin ointment  1 application Nasal BID   nortriptyline  20 mg Oral QHS   ondansetron (ZOFRAN) IV  4 mg Intravenous Once   pantoprazole  40 mg  Oral Daily   Continuous Infusions:  cefTRIAXone (ROCEPHIN)  IV 2 g (07/16/21 1106)   metronidazole 500 mg (07/16/21 2226)   sodium chloride       LOS: 3 days    Time spent: 15 mins     Wyvonnia Dusky, MD [facility] Pager [phone]  If 7PM-7AM, please contact night-coverage 07/17/2021, 7:47 AM

## 2021-07-18 LAB — BASIC METABOLIC PANEL
Anion gap: 6 (ref 5–15)
BUN: 13 mg/dL (ref 6–20)
CO2: 27 mmol/L (ref 22–32)
Calcium: 8.3 mg/dL — ABNORMAL LOW (ref 8.9–10.3)
Chloride: 100 mmol/L (ref 98–111)
Creatinine, Ser: 0.71 mg/dL (ref 0.44–1.00)
GFR, Estimated: >60 mL/min (ref >60)
Glucose, Bld: 210 mg/dL — ABNORMAL HIGH (ref 70–99)
Potassium: 3.6 mmol/L (ref 3.5–5.1)
Sodium: 133 mmol/L — ABNORMAL LOW (ref 135–145)

## 2021-07-18 LAB — CBC
HCT: 33.5 % — ABNORMAL LOW (ref 36.0–46.0)
Hemoglobin: 11.4 g/dL — ABNORMAL LOW (ref 12.0–15.0)
MCH: 28.1 pg (ref 26.0–34.0)
MCHC: 34 g/dL (ref 30.0–36.0)
MCV: 82.5 fL (ref 80.0–100.0)
Platelets: 153 10*3/uL (ref 150–400)
RBC: 4.06 MIL/uL (ref 3.87–5.11)
RDW: 13.1 % (ref 11.5–15.5)
WBC: 7.4 10*3/uL (ref 4.0–10.5)
nRBC: 0 % (ref 0.0–0.2)

## 2021-07-18 LAB — GLUCOSE, CAPILLARY
Glucose-Capillary: 233 mg/dL — ABNORMAL HIGH (ref 70–99)
Glucose-Capillary: 339 mg/dL — ABNORMAL HIGH (ref 70–99)

## 2021-07-18 MED ORDER — HYDROCODONE-ACETAMINOPHEN 5-325 MG PO TABS: 1.0000 | ORAL_TABLET | Freq: Four times a day (QID) | ORAL | 0 refills | Status: DC | PRN

## 2021-07-18 MED ORDER — METRONIDAZOLE 500 MG PO TABS: 500.0000 mg | ORAL_TABLET | Freq: Three times a day (TID) | ORAL | 0 refills | Status: AC

## 2021-07-18 MED ORDER — CEFDINIR 300 MG PO CAPS: 300.0000 mg | ORAL_CAPSULE | Freq: Two times a day (BID) | ORAL | 0 refills | Status: DC

## 2021-07-18 NOTE — TOC Progression Note (Addendum)
Transition of Care [facility]) - Progression Note    Patient Details  Name: Nicole Villegas MRN: [id number] Date of Birth: [birth year]  Transition of Care [facility]) CM/SW Contact  Bing Quarry, RN Phone Number: 07/18/2021, 1:25 PM  Clinical Narrative: 07/18/21: Patient to have discharge plan of [facility] services PT, but unable to find agency to accept so far. Messaged provider if outpatient services might be appropriate. Pending answer. Tried [facility]. Left VM at [facility]. Will continue to try to secure agency. Barbie Kyrus Hyde RN CM  130 pm.   [facility] returned RN CM call and is checking on if they can accept patient. Pending answer. Gabriel Cirri RN CM   Unit RN let RN CM know patient left for home already. May have to consider outpatient PT services. Will attempt to contact patient at home. Gabriel Cirri RN CM        Expected Discharge Plan and Services           Expected Discharge Date: 07/18/21                                     Social Determinants of Health (SDOH) Interventions    Readmission Risk Interventions No flowsheet data found.

## 2021-07-18 NOTE — TOC Transition Note (Signed)
Transition of Care [facility]) - CM/SW Discharge Note   Patient Details  Name: Myria Steenbergen MRN: [id number] Date of Birth: [birth year]  Transition of Care [facility]) CM/SW Contact:  Bing Quarry, RN Phone Number: 07/18/2021, 2:22 PM   Clinical Narrative:  Patient left with family prior to [facility] agency being secured. Contacted daughter, Heath Gold, at [phone]. Explained that RN CM will continue looking but come Monday or Tuesday if she does not hear back from RN CM do go through PCP for outpatient therapy order. She verbalized understanding of this plan. Gabriel Cirri RN CM            Patient Goals and CMS Choice        Discharge Placement                       Discharge Plan and Services                                     Social Determinants of Health (SDOH) Interventions     Readmission Risk Interventions No flowsheet data found.

## 2021-07-18 NOTE — Plan of Care (Signed)
Patient ID: Nicole Villegas, female   DOB: 1965-08-17, 55 y.o.   MRN: [id number]  Problem: Education: Goal: Knowledge of General Education information will improve Description: Including pain rating scale, medication(s)/side effects and non-pharmacologic comfort measures Outcome: Adequate for Discharge   Problem: Health Behavior/Discharge Planning: Goal: Ability to manage health-related needs will improve Outcome: Adequate for Discharge   Problem: Clinical Measurements: Goal: Ability to maintain clinical measurements within normal limits will improve Outcome: Adequate for Discharge Goal: Will remain free from infection Outcome: Adequate for Discharge Goal: Diagnostic test results will improve Outcome: Adequate for Discharge Goal: Respiratory complications will improve Outcome: Adequate for Discharge Goal: Cardiovascular complication will be avoided Outcome: Adequate for Discharge   Problem: Activity: Goal: Risk for activity intolerance will decrease Outcome: Adequate for Discharge   Problem: Nutrition: Goal: Adequate nutrition will be maintained Outcome: Adequate for Discharge   Problem: Coping: Goal: Level of anxiety will decrease Outcome: Adequate for Discharge   Problem: Elimination: Goal: Will not experience complications related to bowel motility Outcome: Adequate for Discharge Goal: Will not experience complications related to urinary retention Outcome: Adequate for Discharge   Problem: Pain Managment: Goal: General experience of comfort will improve Outcome: Adequate for Discharge   Problem: Safety: Goal: Ability to remain free from injury will improve Outcome: Adequate for Discharge   Problem: Skin Integrity: Goal: Risk for impaired skin integrity will decrease Outcome: Adequate for Discharge   Problem: Education: Goal: Ability to describe self-care measures that may prevent or decrease complications (Diabetes Survival Skills Education) will improve Outcome:  Adequate for Discharge Goal: Individualized Educational Video(s) Outcome: Adequate for Discharge   Problem: Coping: Goal: Ability to adjust to condition or change in health will improve Outcome: Adequate for Discharge   Problem: Fluid Volume: Goal: Ability to maintain a balanced intake and output will improve Outcome: Adequate for Discharge   Problem: Health Behavior/Discharge Planning: Goal: Ability to identify and utilize available resources and services will improve Outcome: Adequate for Discharge Goal: Ability to manage health-related needs will improve Outcome: Adequate for Discharge   Problem: Metabolic: Goal: Ability to maintain appropriate glucose levels will improve Outcome: Adequate for Discharge   Problem: Nutritional: Goal: Maintenance of adequate nutrition will improve Outcome: Adequate for Discharge Goal: Progress toward achieving an optimal weight will improve Outcome: Adequate for Discharge   Problem: Skin Integrity: Goal: Risk for impaired skin integrity will decrease Outcome: Adequate for Discharge   Problem: Tissue Perfusion: Goal: Adequacy of tissue perfusion will improve Outcome: Adequate for Discharge   Problem: Acute Rehab PT Goals(only PT should resolve) Goal: Pt Will Go Supine/Side To Sit Outcome: Adequate for Discharge Goal: Pt Will Go Sit To Supine/Side Outcome: Adequate for Discharge Goal: Patient Will Transfer Sit To/From Stand Outcome: Adequate for Discharge Goal: Pt Will Ambulate Outcome: Adequate for Discharge    Lidia Collum, RN

## 2021-07-18 NOTE — Discharge Summary (Signed)
Physician Discharge Summary  Mickie Badders PVV:[id number] DOB: 1966-07-09 DOA: 07/14/2021  PCP: Kerri Perches, PA-C  Admit date: 07/14/2021 Discharge date: 07/18/2021  Admitted From: home  Disposition:  home   Recommendations for Outpatient Follow-up:  Follow up with PCP in 1-2 weeks F/u w/ general surg, Dr. Lysle Pearl, in 1 week  Home Health: Equipment/Devices: JP drain   Discharge Condition: stable CODE STATUS: full  Diet recommendation: Heart Healthy / Carb Modified  Brief/Interim Summary: HPI was taken from Dr. Blaine Hamper: Nicole Villegas is a 55 y.o. female with medical history significant of hypertension, hyperlipidemia, diabetes mellitus, GERD, depression, CKD-2, who presents with abdominal pain.   Patient states that she has abdominal pain for more than 2 days, which is located in right lower quadrant, constant, sharp, moderate to severe, nonradiating.  Associated with nausea and multiple episodes of nonbilious nonbloody vomiting.  Patient had 1 episode of loose stool bowel movement earlier, currently no diarrhea.  Patient has fever and chills.  Denies chest pain, cough, shortness breath.  Patient has dysuria, burning on urination and increased urinary frequency.   ED Course: pt was found to have WBC 7.3, lactic acid 7.1, INR 1.1, blood sugar 522 with anion gap 15, negative COVID PCR, urinalysis (hazy appearance, negative leukocyte, many bacteria, WBC 6-10), slightly worsening renal function, temperature 100.8, blood pressure 131/71, heart rate 123, RR 20, oxygen saturation 99% on room air.  Chest x-ray negative.  CT abdomen/pelvis that showed acute pancreatitis.  Patient is admitted to [facility] as inpatient. Dr. Lysle Pearl of surgery is consulted.   Hospital course from Dr. Jimmye Norman 12/28-12/31/22: Pt was found to have acute appendicitis, UTI & bacteremia. Urine and blood cxs both grew e. coli. (BCID was also positive for bacteroides fragilis but it did not grow in the cx.)  Pt was treated w/ IV  rocephin, flagyl while inpatient and was d/c home w/ po cefdinir, flagyl to complete a 14 day course total. Also, pt had lap appendectomy on 07/15/21 & pt was d/c w/ JP drain as per general surg recs. Repeat blood cxs were NGTD prior to d/c. For more information, please see previous progress/consult notes.   Discharge Diagnoses:  Principal Problem:   Acute appendicitis Active Problems:   UTI (urinary tract infection)   Type II diabetes mellitus with renal manifestations (HCC)   CKD (chronic kidney disease), stage II   HLD (hyperlipidemia)   Hypertension   GERD (gastroesophageal reflux disease)   Depression   Severe sepsis ([place])  Acute appendicitis: s/p lap appendectomy 07/15/21 as per general surg. CT scan showed acute appendicitis without complications.  Spiked a fever to 103 yesterday. Continue on IV rocephin, flagyl. Morphine, percocet prn    Bacteremia: blood cxs growing e.coli and bacteroides fragilis. Continue on IV rocephin, flagyl. Repeat blood cxs NGTD    UTI: urine cx growing e.coli.  Continue on IV abxs    Severe sepsis:  due to UTI, acute appendicitis & bacteremia. Met cirtieria w/ tachycardia, fever, elevated lactic acid & above infections. Sepsis resolved    Thrombocytopenia: etiology unclear, labile. Will continue to monitor    DM2: HbA1c 12.8, poorly controlled. Continue on glargine, SSI w/ accuchecks    CKDII: Baseline creatinine 0.7 on 05/22/2020. Cr is at baseline    HLD: not taking a statin currently    HTN: continue on home dose of atenolol. Continue to hold home dose of amlodipine, prinzide   GERD: continue on PPI    Depression: severity unknown. Continue on home dose of  nortriptyline   Discharge Instructions  Discharge Instructions     Amb Referral to Nutrition and Diabetic Education   Complete by: As directed    Diet - low sodium heart healthy   Complete by: As directed    Diet Carb Modified   Complete by: As directed    Discharge instructions    Complete by: As directed    F/u w/ general surg, Dr. Lysle Pearl, in 1 week. Keep drain in place until followup with me in one week.  ok to shower. F/u w/ PCP in 1-2 weeks   Increase activity slowly   Complete by: As directed       Allergies as of 07/18/2021   No Known Allergies      Medication List     STOP taking these medications    aspirin 81 MG EC tablet   cyclobenzaprine 5 MG tablet Commonly known as: FLEXERIL       TAKE these medications    acetaminophen 325 MG tablet Commonly known as: Tylenol Take 2 tablets (650 mg total) by mouth every 8 (eight) hours as needed for mild pain.   amLODipine 2.5 MG tablet Commonly known as: NORVASC Take 2.5 mg by mouth daily.   atenolol 50 MG tablet Commonly known as: TENORMIN Take 50 mg by mouth daily.   atorvastatin 80 MG tablet Commonly known as: LIPITOR Take 1 tablet (80 mg total) by mouth daily.   cefdinir 300 MG capsule Commonly known as: OMNICEF Take 1 capsule (300 mg total) by mouth 2 (two) times daily for 10 days.   docusate sodium 100 MG capsule Commonly known as: Colace Take 1 capsule (100 mg total) by mouth 2 (two) times daily as needed for up to 10 days for mild constipation.   fexofenadine 60 MG tablet Commonly known as: ALLEGRA Take 60 mg by mouth 2 (two) times daily as needed for allergies.   fluticasone 50 MCG/ACT nasal spray Commonly known as: FLONASE Place 2 sprays into both nostrils daily as needed for allergies.   gabapentin 300 MG capsule Commonly known as: NEURONTIN Take 300 mg by mouth 2 (two) times daily.   glipiZIDE XL 10 MG 24 hr tablet Generic drug: glipiZIDE Take 10 mg by mouth 2 (two) times daily.   HYDROcodone-acetaminophen 5-325 MG tablet Commonly known as: Norco Take 1 tablet by mouth every 6 (six) hours as needed for up to 5 days for severe pain.   ibuprofen 800 MG tablet Commonly known as: ADVIL Take 1 tablet (800 mg total) by mouth every 8 (eight) hours as needed for mild  pain or moderate pain.   insulin aspart 100 UNIT/ML FlexPen Commonly known as: NOVOLOG Inject 50 Units into the skin See admin instructions. Inject 39 units twice a day before lunch and dinner.   insulin detemir 100 UNIT/ML injection Commonly known as: LEVEMIR Inject 60 Units into the skin at bedtime.   lisinopril-hydrochlorothiazide 20-12.5 MG tablet Commonly known as: ZESTORETIC Take 2 tablets by mouth daily.   metroNIDAZOLE 500 MG tablet Commonly known as: Flagyl Take 1 tablet (500 mg total) by mouth 3 (three) times daily for 10 days.   nortriptyline 10 MG capsule Commonly known as: PAMELOR Take 2 capsules by mouth at bedtime.   Ozempic (0.25 or 0.5 MG/DOSE) 2 MG/1.5ML Sopn Generic drug: Semaglutide(0.25 or 0.5MG/DOS) Inject into the skin.   pantoprazole 40 MG tablet Commonly known as: Protonix Take 1 tablet (40 mg total) by mouth daily.   sitaGLIPtin-metformin 50-1000 MG tablet Commonly known  as: JANUMET Take 1 tablet by mouth 2 (two) times daily with a meal.        Follow-up Information     Sakai, Isami, DO Follow up in 1 week(s).   Specialty: Surgery Why: post op lap appy, staple removal Contact information: [address] [place] [address] [phone]                No Known Allergies  Consultations: General surg    Procedures/Studies: DG Chest 2 View  Result Date: 07/14/2021 CLINICAL DATA:  Suspected Sepsis, abdominal pain, nausea, vomiting and fever EXAM: CHEST - 2 VIEW COMPARISON:  01/30/2020 chest radiograph. FINDINGS: Stable cardiomediastinal silhouette with normal heart size. No pneumothorax. No pleural effusion. Lungs appear clear, with no acute consolidative airspace disease and no pulmonary edema. IMPRESSION: No active cardiopulmonary disease. Electronically Signed   By: Ilona Sorrel M.D.   On: 07/14/2021 12:54   CT ABDOMEN PELVIS W CONTRAST  Result Date: 07/14/2021 CLINICAL DATA:  Abdominal pain, acute, nonlocalized EXAM:  CT ABDOMEN AND PELVIS WITH CONTRAST TECHNIQUE: Multidetector CT imaging of the abdomen and pelvis was performed using the standard protocol following bolus administration of intravenous contrast. CONTRAST:  153m OMNIPAQUE IOHEXOL 300 MG/ML  SOLN COMPARISON:  None. FINDINGS: Motion artifact is present. Lower chest: No acute abnormality. Hepatobiliary: No focal liver abnormality is seen. No gallstones, gallbladder wall thickening, or biliary dilatation. Pancreas: Unremarkable. Spleen: Unremarkable. Adrenals/Urinary Tract: Adrenals, kidneys, and partially distended bladder are unremarkable. Stomach/Bowel: Stomach is within normal limits. Bowel is normal in caliber. Fluid-filled, dilated appendix with surrounding fat infiltration. Vascular/Lymphatic: Atherosclerosis.  No enlarged lymph nodes. Reproductive: Uterus and bilateral adnexa are unremarkable. Other: No free air.  No abscess. Musculoskeletal: Lumbar spine degenerative changes. IMPRESSION: Acute appendicitis without complication. These results were called by telephone at the time of interpretation on 07/14/2021 at 2:07 pm to provider CARI TRIPLETT , who verbally acknowledged these results. Electronically Signed   By: PMacy MisM.D.   On: 07/14/2021 14:15   (Echo, Carotid, EGD, Colonoscopy, ERCP)    Subjective: Pt c/o fatigue    Discharge Exam: Vitals:   07/18/21 0513 07/18/21 0810  BP: 132/68 140/71  Pulse: 88 91  Resp: 17 16  Temp: 98.8 F (37.1 C) 99.7 F (37.6 C)  SpO2: 98% 98%   Vitals:   07/17/21 1653 07/17/21 1941 07/18/21 0513 07/18/21 0810  BP: (!) 146/67 (!) 143/65 132/68 140/71  Pulse: 85 96 88 91  Resp: '20 17 17 16  ' Temp: 99.3 F (37.4 C) 99.1 F (37.3 C) 98.8 F (37.1 C) 99.7 F (37.6 C)  TempSrc: Oral Oral Oral Oral  SpO2: 97% 98% 98% 98%  Weight:      Height:        General: Pt is alert, awake, not in acute distress Cardiovascular: S1/S2 +, no rubs, no gallops Respiratory: CTA bilaterally, no wheezing, no  rhonchi Abdominal: Soft, NT, ND, bowel sounds + Extremities: no edema, no cyanosis    The results of significant diagnostics from this hospitalization (including imaging, microbiology, ancillary and laboratory) are listed below for reference.     Microbiology: Recent Results (from the past 240 hour(s))  Urine Culture     Status: Abnormal   Collection Time: 07/14/21 11:39 AM   Specimen: Urine, Random  Result Value Ref Range Status   Specimen Description   Final    URINE, RANDOM Performed at [facility] [address] [place] [address]   Special Requests  Final    NONE Performed at [facility], [address] [place], [place] [address]    Culture >=100,000 COLONIES/mL ESCHERICHIA COLI (A)  Final   Report Status 07/16/2021 FINAL  Final   Organism ID, Bacteria ESCHERICHIA COLI (A)  Final      Susceptibility   Escherichia coli - MIC*    AMPICILLIN >=32 RESISTANT Resistant     CEFAZOLIN <=4 SENSITIVE Sensitive     CEFEPIME <=0.12 SENSITIVE Sensitive     CEFTRIAXONE <=0.25 SENSITIVE Sensitive     CIPROFLOXACIN >=4 RESISTANT Resistant     GENTAMICIN <=1 SENSITIVE Sensitive     IMIPENEM <=0.25 SENSITIVE Sensitive     NITROFURANTOIN <=16 SENSITIVE Sensitive     TRIMETH/SULFA <=20 SENSITIVE Sensitive     AMPICILLIN/SULBACTAM 16 INTERMEDIATE Intermediate     PIP/TAZO <=4 SENSITIVE Sensitive     * >=100,000 COLONIES/mL ESCHERICHIA COLI  Resp Panel by RT-PCR (Flu A&B, Covid) Nasopharyngeal Swab     Status: None   Collection Time: 07/14/21 12:19 PM   Specimen: Nasopharyngeal Swab; Nasopharyngeal(NP) swabs in vial transport medium  Result Value Ref Range Status   SARS Coronavirus 2 by RT PCR NEGATIVE NEGATIVE Final    Comment: (NOTE) SARS-CoV-2 target nucleic acids are NOT DETECTED.  The SARS-CoV-2 RNA is generally detectable in upper respiratory specimens during the acute phase of infection. The lowest concentration of SARS-CoV-2 viral copies  this assay can detect is 138 copies/mL. A negative result does not preclude SARS-Cov-2 infection and should not be used as the sole basis for treatment or other patient management decisions. A negative result may occur with  improper specimen collection/handling, submission of specimen other than nasopharyngeal swab, presence of viral mutation(s) within the areas targeted by this assay, and inadequate number of viral copies(<138 copies/mL). A negative result must be combined with clinical observations, patient history, and epidemiological information. The expected result is Negative.  Fact Sheet for Patients:  EntrepreneurPulse.com.au  Fact Sheet for Healthcare Providers:  IncredibleEmployment.be  This test is no t yet approved or cleared by the [place] FDA and  has been authorized for detection and/or diagnosis of SARS-CoV-2 by FDA under an Emergency Use Authorization (EUA). This EUA will remain  in effect (meaning this test can be used) for the duration of the COVID-19 declaration under Section 564(b)(1) of the Act, 21 U.S.C.section 360bbb-3(b)(1), unless the authorization is terminated  or revoked sooner.       Influenza A by PCR NEGATIVE NEGATIVE Final   Influenza B by PCR NEGATIVE NEGATIVE Final    Comment: (NOTE) The Xpert Xpress SARS-CoV-2/FLU/RSV plus assay is intended as an aid in the diagnosis of influenza from Nasopharyngeal swab specimens and should not be used as a sole basis for treatment. Nasal washings and aspirates are unacceptable for Xpert Xpress SARS-CoV-2/FLU/RSV testing.  Fact Sheet for Patients: EntrepreneurPulse.com.au  Fact Sheet for Healthcare Providers: IncredibleEmployment.be  This test is not yet approved or cleared by the [place] FDA and has been authorized for detection and/or diagnosis of SARS-CoV-2 by FDA under an Emergency Use Authorization (EUA). This EUA  will remain in effect (meaning this test can be used) for the duration of the COVID-19 declaration under Section 564(b)(1) of the Act, 21 U.S.C. section 360bbb-3(b)(1), unless the authorization is terminated or revoked.  Performed at [facility], [address] [place],  [address]   Culture, blood (Routine x 2)     Status: Abnormal   Collection Time: 07/14/21 12:19 PM  Specimen: BLOOD  Result Value Ref Range Status   Specimen Description   Final    BLOOD RIGHT ANTECUBITAL Performed at [facility], [address] [place], [place] [address]    Special Requests   Final    BOTTLES DRAWN AEROBIC AND ANAEROBIC Blood Culture adequate volume Performed at [facility], [address] [place],  [address]    Culture  Setup Time   Final    CORRECTED RESULTS GRAM NEGATIVE RODS PREVIOUSLY REPORTED AS: GRAM NEGATIVE COCCI CORRECTED RESULTS CALLED TO: [place] [id number] FCP ANAEROBIC BOTTLE ONLY    Culture ESCHERICHIA COLI (A)  Final   Report Status 07/17/2021 FINAL  Final   Organism ID, Bacteria ESCHERICHIA COLI  Final      Susceptibility   Escherichia coli - MIC*    AMPICILLIN >=32 RESISTANT Resistant     CEFAZOLIN <=4 SENSITIVE Sensitive     CEFEPIME <=0.12 SENSITIVE Sensitive     CEFTAZIDIME <=1 SENSITIVE Sensitive     CEFTRIAXONE <=0.25 SENSITIVE Sensitive     CIPROFLOXACIN >=4 RESISTANT Resistant     GENTAMICIN <=1 SENSITIVE Sensitive     IMIPENEM <=0.25 SENSITIVE Sensitive     TRIMETH/SULFA <=20 SENSITIVE Sensitive     AMPICILLIN/SULBACTAM 16 INTERMEDIATE Intermediate     PIP/TAZO <=4 SENSITIVE Sensitive     * ESCHERICHIA COLI  Blood Culture ID Panel (Reflexed)     Status: Abnormal   Collection Time: 07/14/21 12:19 PM  Result Value Ref Range Status   Enterococcus faecalis NOT DETECTED NOT DETECTED Final   Enterococcus Faecium NOT DETECTED NOT DETECTED Final   Listeria monocytogenes NOT DETECTED NOT DETECTED Final    Staphylococcus species NOT DETECTED NOT DETECTED Final   Staphylococcus aureus (BCID) NOT DETECTED NOT DETECTED Final   Staphylococcus epidermidis NOT DETECTED NOT DETECTED Final   Staphylococcus lugdunensis NOT DETECTED NOT DETECTED Final   Streptococcus species NOT DETECTED NOT DETECTED Final   Streptococcus agalactiae NOT DETECTED NOT DETECTED Final   Streptococcus pneumoniae NOT DETECTED NOT DETECTED Final   Streptococcus pyogenes NOT DETECTED NOT DETECTED Final   A.calcoaceticus-baumannii NOT DETECTED NOT DETECTED Final   Bacteroides fragilis DETECTED (A) NOT DETECTED Final    Comment: RESULT CALLED TO, READ BACK BY AND VERIFIED WITH: NATHAN BELUE 07/15/21 @ 0042 BY SB    Enterobacterales DETECTED (A) NOT DETECTED Final    Comment: Enterobacterales represent a large order of gram negative bacteria, not a single organism. RESULT CALLED TO, READ BACK BY AND VERIFIED WITH: NATHAN BELUE 07/15/21 @ 0042 BY SB    Enterobacter cloacae complex NOT DETECTED NOT DETECTED Final   Escherichia coli DETECTED (A) NOT DETECTED Final    Comment: RESULT CALLED TO, READ BACK BY AND VERIFIED WITH: NATHAN BELUE 07/15/21 @ 0042 BY SB    Klebsiella aerogenes NOT DETECTED NOT DETECTED Final   Klebsiella oxytoca NOT DETECTED NOT DETECTED Final   Klebsiella pneumoniae NOT DETECTED NOT DETECTED Final   Proteus species NOT DETECTED NOT DETECTED Final   Salmonella species NOT DETECTED NOT DETECTED Final   Serratia marcescens NOT DETECTED NOT DETECTED Final   Haemophilus influenzae NOT DETECTED NOT DETECTED Final   Neisseria meningitidis NOT DETECTED NOT DETECTED Final   Pseudomonas aeruginosa NOT DETECTED NOT DETECTED Final   Stenotrophomonas maltophilia NOT DETECTED NOT DETECTED Final   Candida albicans NOT DETECTED NOT DETECTED Final   Candida auris NOT DETECTED NOT DETECTED Final   Candida glabrata NOT DETECTED NOT DETECTED Final   Candida  krusei NOT DETECTED NOT DETECTED Final   Candida parapsilosis  NOT DETECTED NOT DETECTED Final   Candida tropicalis NOT DETECTED NOT DETECTED Final   Cryptococcus neoformans/gattii NOT DETECTED NOT DETECTED Final   CTX-M ESBL NOT DETECTED NOT DETECTED Final   Carbapenem resistance IMP NOT DETECTED NOT DETECTED Final   Carbapenem resistance KPC NOT DETECTED NOT DETECTED Final   Carbapenem resistance NDM NOT DETECTED NOT DETECTED Final   Carbapenem resist OXA 48 LIKE NOT DETECTED NOT DETECTED Final   Carbapenem resistance VIM NOT DETECTED NOT DETECTED Final    Comment: Performed at [facility], [address] [place], [place] [address]  Culture, blood (Routine x 2)     Status: Abnormal   Collection Time: 07/14/21  1:30 PM   Specimen: BLOOD  Result Value Ref Range Status   Specimen Description   Final    BLOOD LEFT ANTECUBITAL Performed at [facility], [address], [place], [place] [address]    Special Requests   Final    BOTTLES DRAWN AEROBIC AND ANAEROBIC Blood Culture adequate volume Performed at [facility], [address] [place], [place] [address]    Culture  Setup Time   Final    CORRECTED RESULTS GRAM NEGATIVE RODS PREVIOUSLY REPORTED AS: GRAM NEGATIVE COCCI CORRECTED RESULTS CALLED TO: PHARMD MORGAN H [id number] FCP IN BOTH AEROBIC AND ANAEROBIC BOTTLES    Culture (A)  Final    ESCHERICHIA COLI SUSCEPTIBILITIES PERFORMED ON PREVIOUS CULTURE WITHIN THE LAST 5 DAYS. Performed at [facility], [place] [address], [place], [place] [address]    Report Status 07/17/2021 FINAL  Final  Surgical PCR screen     Status: Abnormal   Collection Time: 07/15/21  1:32 AM   Specimen: Nasal Mucosa; Nasal Swab  Result Value Ref Range Status   MRSA, PCR NEGATIVE NEGATIVE Final   Staphylococcus aureus POSITIVE (A) NEGATIVE Final    Comment: (NOTE) The Xpert SA Assay (FDA approved for NASAL specimens in patients 80 years of age and older), is one component of a comprehensive surveillance program. It is not  intended to diagnose infection nor to guide or monitor treatment. Performed at [facility], [address] [place], [place] [address]   CULTURE, BLOOD (ROUTINE X 2) w Reflex to ID Panel     Status: None (Preliminary result)   Collection Time: 07/16/21 10:40 AM   Specimen: BLOOD  Result Value Ref Range Status   Specimen Description BLOOD BLOOD LEFT HAND  Final   Special Requests   Final    BOTTLES DRAWN AEROBIC AND ANAEROBIC Blood Culture results may not be optimal due to an inadequate volume of blood received in culture bottles   Culture   Final    NO GROWTH 2 DAYS Performed at [facility], [address], [place], [place] [address]    Report Status PENDING  Incomplete  CULTURE, BLOOD (ROUTINE X 2) w Reflex to ID Panel     Status: None (Preliminary result)   Collection Time: 07/16/21 10:40 AM   Specimen: BLOOD  Result Value Ref Range Status   Specimen Description BLOOD BLOOD RIGHT HAND  Final   Special Requests   Final    BOTTLES DRAWN AEROBIC AND ANAEROBIC Blood Culture results may not be optimal due to an inadequate volume of blood received in culture bottles   Culture   Final    NO GROWTH 2 DAYS Performed at [facility], [address] [place], [place]  [address]    Report Status PENDING  Incomplete     Labs: BNP (last 3 results) Recent Labs    07/14/21 1219  BNP 158.3*   Basic Metabolic Panel: Recent Labs  Lab 07/14/21 1219 07/15/21 0539 07/16/21 0450 07/17/21 0530 07/18/21 0538  NA 126* 135 136 132* 133*  K 4.1 3.5 3.8 3.5 3.6  CL 88* 103 105 99 100  CO2 '23 24 22 23 27  ' GLUCOSE 522* 197* 204* 186* 210*  BUN '20 19 17 12 13  ' CREATININE 1.12* 0.79 0.76 0.51 0.71  CALCIUM 9.5 8.4* 8.3* 8.4* 8.3*   Liver Function Tests: Recent Labs  Lab 07/14/21 1219  AST 31  ALT 15  ALKPHOS 95  BILITOT 1.5*  PROT 8.3*  ALBUMIN 4.1   No results for input(s): LIPASE, AMYLASE in the last 168 hours. No results for input(s): AMMONIA in  the last 168 hours. CBC: Recent Labs  Lab 07/14/21 1219 07/15/21 0539 07/16/21 0450 07/17/21 0530 07/18/21 0538  WBC 7.2 8.1 7.2 6.4 7.4  NEUTROABS 6.7  --   --   --   --   HGB 15.4* 12.2 10.9* 12.4 11.4*  HCT 45.0 35.0* 31.5* 36.4 33.5*  MCV 83.5 82.7 83.6 83.3 82.5  PLT 237 146* 124* 141* 153   Cardiac Enzymes: No results for input(s): CKTOTAL, CKMB, CKMBINDEX, TROPONINI in the last 168 hours. BNP: Invalid input(s): POCBNP CBG: Recent Labs  Lab 07/17/21 1156 07/17/21 1656 07/17/21 2124 07/18/21 0914 07/18/21 1142  GLUCAP 221* 262* 245* 233* 339*   D-Dimer No results for input(s): DDIMER in the last 72 hours. Hgb A1c No results for input(s): HGBA1C in the last 72 hours. Lipid Profile No results for input(s): CHOL, HDL, LDLCALC, TRIG, CHOLHDL, LDLDIRECT in the last 72 hours. Thyroid function studies No results for input(s): TSH, T4TOTAL, T3FREE, THYROIDAB in the last 72 hours.  Invalid input(s): FREET3 Anemia work up No results for input(s): VITAMINB12, FOLATE, FERRITIN, TIBC, IRON, RETICCTPCT in the last 72 hours. Urinalysis    Component Value Date/Time   COLORURINE YELLOW (A) 07/14/2021 1219   APPEARANCEUR HAZY (A) 07/14/2021 1219   APPEARANCEUR Clear 05/05/2018 1336   LABSPEC 1.023 07/14/2021 1219   PHURINE 5.0 07/14/2021 1219   GLUCOSEU >=500 (A) 07/14/2021 1219   HGBUR NEGATIVE 07/14/2021 1219   BILIRUBINUR NEGATIVE 07/14/2021 1219   BILIRUBINUR Negative 05/05/2018 1336   KETONESUR 5 (A) 07/14/2021 1219   PROTEINUR 100 (A) 07/14/2021 1219   NITRITE NEGATIVE 07/14/2021 1219   LEUKOCYTESUR NEGATIVE 07/14/2021 1219   Sepsis Labs Invalid input(s): PROCALCITONIN,  WBC,  LACTICIDVEN Microbiology Recent Results (from the past 240 hour(s))  Urine Culture     Status: Abnormal   Collection Time: 07/14/21 11:39 AM   Specimen: Urine, Random  Result Value Ref Range Status   Specimen Description   Final    URINE, RANDOM Performed at [facility],  [address] [place], [place] [address]    Special Requests   Final    NONE Performed at [facility], [address] [place], [place] [address]    Culture >=100,000 COLONIES/mL ESCHERICHIA COLI (A)  Final   Report Status 07/16/2021 FINAL  Final   Organism ID, Bacteria ESCHERICHIA COLI (A)  Final      Susceptibility   Escherichia coli - MIC*    AMPICILLIN >=32 RESISTANT Resistant     CEFAZOLIN <=4 SENSITIVE Sensitive     CEFEPIME <=0.12 SENSITIVE Sensitive     CEFTRIAXONE <=0.25 SENSITIVE  Sensitive     CIPROFLOXACIN >=4 RESISTANT Resistant     GENTAMICIN <=1 SENSITIVE Sensitive     IMIPENEM <=0.25 SENSITIVE Sensitive     NITROFURANTOIN <=16 SENSITIVE Sensitive     TRIMETH/SULFA <=20 SENSITIVE Sensitive     AMPICILLIN/SULBACTAM 16 INTERMEDIATE Intermediate     PIP/TAZO <=4 SENSITIVE Sensitive     * >=100,000 COLONIES/mL ESCHERICHIA COLI  Resp Panel by RT-PCR (Flu A&B, Covid) Nasopharyngeal Swab     Status: None   Collection Time: 07/14/21 12:19 PM   Specimen: Nasopharyngeal Swab; Nasopharyngeal(NP) swabs in vial transport medium  Result Value Ref Range Status   SARS Coronavirus 2 by RT PCR NEGATIVE NEGATIVE Final    Comment: (NOTE) SARS-CoV-2 target nucleic acids are NOT DETECTED.  The SARS-CoV-2 RNA is generally detectable in upper respiratory specimens during the acute phase of infection. The lowest concentration of SARS-CoV-2 viral copies this assay can detect is 138 copies/mL. A negative result does not preclude SARS-Cov-2 infection and should not be used as the sole basis for treatment or other patient management decisions. A negative result may occur with  improper specimen collection/handling, submission of specimen other than nasopharyngeal swab, presence of viral mutation(s) within the areas targeted by this assay, and inadequate number of viral copies(<138 copies/mL). A negative result must be combined with clinical observations, patient history, and  epidemiological information. The expected result is Negative.  Fact Sheet for Patients:  EntrepreneurPulse.com.au  Fact Sheet for Healthcare Providers:  IncredibleEmployment.be  This test is no t yet approved or cleared by the [place] FDA and  has been authorized for detection and/or diagnosis of SARS-CoV-2 by FDA under an Emergency Use Authorization (EUA). This EUA will remain  in effect (meaning this test can be used) for the duration of the COVID-19 declaration under Section 564(b)(1) of the Act, 21 U.S.C.section 360bbb-3(b)(1), unless the authorization is terminated  or revoked sooner.       Influenza A by PCR NEGATIVE NEGATIVE Final   Influenza B by PCR NEGATIVE NEGATIVE Final    Comment: (NOTE) The Xpert Xpress SARS-CoV-2/FLU/RSV plus assay is intended as an aid in the diagnosis of influenza from Nasopharyngeal swab specimens and should not be used as a sole basis for treatment. Nasal washings and aspirates are unacceptable for Xpert Xpress SARS-CoV-2/FLU/RSV testing.  Fact Sheet for Patients: EntrepreneurPulse.com.au  Fact Sheet for Healthcare Providers: IncredibleEmployment.be  This test is not yet approved or cleared by the [place] FDA and has been authorized for detection and/or diagnosis of SARS-CoV-2 by FDA under an Emergency Use Authorization (EUA). This EUA will remain in effect (meaning this test can be used) for the duration of the COVID-19 declaration under Section 564(b)(1) of the Act, 21 U.S.C. section 360bbb-3(b)(1), unless the authorization is terminated or revoked.  Performed at [facility], [address] [place], [place] [address]   Culture, blood (Routine x 2)     Status: Abnormal   Collection Time: 07/14/21 12:19 PM   Specimen: BLOOD  Result Value Ref Range Status   Specimen Description   Final    BLOOD RIGHT ANTECUBITAL Performed at [facility], [address], [place], [place] [address]    Special Requests   Final    BOTTLES DRAWN AEROBIC AND ANAEROBIC Blood Culture adequate volume Performed at [facility], [address] [place], [place] [address]    Culture  Setup Time   Final    CORRECTED RESULTS GRAM NEGATIVE RODS PREVIOUSLY REPORTED AS: GRAM NEGATIVE  COCCI CORRECTED RESULTS CALLED TO: PHARMD MORGAN H [id number] FCP ANAEROBIC BOTTLE ONLY    Culture ESCHERICHIA COLI (A)  Final   Report Status 07/17/2021 FINAL  Final   Organism ID, Bacteria ESCHERICHIA COLI  Final      Susceptibility   Escherichia coli - MIC*    AMPICILLIN >=32 RESISTANT Resistant     CEFAZOLIN <=4 SENSITIVE Sensitive     CEFEPIME <=0.12 SENSITIVE Sensitive     CEFTAZIDIME <=1 SENSITIVE Sensitive     CEFTRIAXONE <=0.25 SENSITIVE Sensitive     CIPROFLOXACIN >=4 RESISTANT Resistant     GENTAMICIN <=1 SENSITIVE Sensitive     IMIPENEM <=0.25 SENSITIVE Sensitive     TRIMETH/SULFA <=20 SENSITIVE Sensitive     AMPICILLIN/SULBACTAM 16 INTERMEDIATE Intermediate     PIP/TAZO <=4 SENSITIVE Sensitive     * ESCHERICHIA COLI  Blood Culture ID Panel (Reflexed)     Status: Abnormal   Collection Time: 07/14/21 12:19 PM  Result Value Ref Range Status   Enterococcus faecalis NOT DETECTED NOT DETECTED Final   Enterococcus Faecium NOT DETECTED NOT DETECTED Final   Listeria monocytogenes NOT DETECTED NOT DETECTED Final   Staphylococcus species NOT DETECTED NOT DETECTED Final   Staphylococcus aureus (BCID) NOT DETECTED NOT DETECTED Final   Staphylococcus epidermidis NOT DETECTED NOT DETECTED Final   Staphylococcus lugdunensis NOT DETECTED NOT DETECTED Final   Streptococcus species NOT DETECTED NOT DETECTED Final   Streptococcus agalactiae NOT DETECTED NOT DETECTED Final   Streptococcus pneumoniae NOT DETECTED NOT DETECTED Final   Streptococcus pyogenes NOT DETECTED NOT DETECTED Final   A.calcoaceticus-baumannii NOT DETECTED NOT DETECTED  Final   Bacteroides fragilis DETECTED (A) NOT DETECTED Final    Comment: RESULT CALLED TO, READ BACK BY AND VERIFIED WITH: NATHAN BELUE 07/15/21 @ 0042 BY SB    Enterobacterales DETECTED (A) NOT DETECTED Final    Comment: Enterobacterales represent a large order of gram negative bacteria, not a single organism. RESULT CALLED TO, READ BACK BY AND VERIFIED WITH: NATHAN BELUE 07/15/21 @ 0042 BY SB    Enterobacter cloacae complex NOT DETECTED NOT DETECTED Final   Escherichia coli DETECTED (A) NOT DETECTED Final    Comment: RESULT CALLED TO, READ BACK BY AND VERIFIED WITH: NATHAN BELUE 07/15/21 @ 0042 BY SB    Klebsiella aerogenes NOT DETECTED NOT DETECTED Final   Klebsiella oxytoca NOT DETECTED NOT DETECTED Final   Klebsiella pneumoniae NOT DETECTED NOT DETECTED Final   Proteus species NOT DETECTED NOT DETECTED Final   Salmonella species NOT DETECTED NOT DETECTED Final   Serratia marcescens NOT DETECTED NOT DETECTED Final   Haemophilus influenzae NOT DETECTED NOT DETECTED Final   Neisseria meningitidis NOT DETECTED NOT DETECTED Final   Pseudomonas aeruginosa NOT DETECTED NOT DETECTED Final   Stenotrophomonas maltophilia NOT DETECTED NOT DETECTED Final   Candida albicans NOT DETECTED NOT DETECTED Final   Candida auris NOT DETECTED NOT DETECTED Final   Candida glabrata NOT DETECTED NOT DETECTED Final   Candida krusei NOT DETECTED NOT DETECTED Final   Candida parapsilosis NOT DETECTED NOT DETECTED Final   Candida tropicalis NOT DETECTED NOT DETECTED Final   Cryptococcus neoformans/gattii NOT DETECTED NOT DETECTED Final   CTX-M ESBL NOT DETECTED NOT DETECTED Final   Carbapenem resistance IMP NOT DETECTED NOT DETECTED Final   Carbapenem resistance KPC NOT DETECTED NOT DETECTED Final   Carbapenem resistance NDM NOT DETECTED NOT DETECTED Final   Carbapenem resist OXA 48 LIKE NOT DETECTED NOT DETECTED Final   Carbapenem resistance  VIM NOT DETECTED NOT DETECTED Final    Comment: Performed at  [facility], [address] [place], [place] [address]  Culture, blood (Routine x 2)     Status: Abnormal   Collection Time: 07/14/21  1:30 PM   Specimen: BLOOD  Result Value Ref Range Status   Specimen Description   Final    BLOOD LEFT ANTECUBITAL Performed at [facility], [address], [place], [place] [address]    Special Requests   Final    BOTTLES DRAWN AEROBIC AND ANAEROBIC Blood Culture adequate volume Performed at [facility], [address] [place], [place] [address]    Culture  Setup Time   Final    CORRECTED RESULTS GRAM NEGATIVE RODS PREVIOUSLY REPORTED AS: GRAM NEGATIVE COCCI CORRECTED RESULTS CALLED TO: PHARMD MORGAN H [id number] FCP IN BOTH AEROBIC AND ANAEROBIC BOTTLES    Culture (A)  Final    ESCHERICHIA COLI SUSCEPTIBILITIES PERFORMED ON PREVIOUS CULTURE WITHIN THE LAST 5 DAYS. Performed at [facility], [place] [address] [place], [place] [address]    Report Status 07/17/2021 FINAL  Final  Surgical PCR screen     Status: Abnormal   Collection Time: 07/15/21  1:32 AM   Specimen: Nasal Mucosa; Nasal Swab  Result Value Ref Range Status   MRSA, PCR NEGATIVE NEGATIVE Final   Staphylococcus aureus POSITIVE (A) NEGATIVE Final    Comment: (NOTE) The Xpert SA Assay (FDA approved for NASAL specimens in patients 70 years of age and older), is one component of a comprehensive surveillance program. It is not intended to diagnose infection nor to guide or monitor treatment. Performed at [facility], [address] [place], [place] [address]   CULTURE, BLOOD (ROUTINE X 2) w Reflex to ID Panel     Status: None (Preliminary result)   Collection Time: 07/16/21 10:40 AM   Specimen: BLOOD  Result Value Ref Range Status   Specimen Description BLOOD BLOOD LEFT HAND  Final   Special Requests   Final    BOTTLES DRAWN AEROBIC AND ANAEROBIC Blood Culture results may not be optimal due to an inadequate volume of blood received  in culture bottles   Culture   Final    NO GROWTH 2 DAYS Performed at [facility], [address] [place], [place] [address]    Report Status PENDING  Incomplete  CULTURE, BLOOD (ROUTINE X 2) w Reflex to ID Panel     Status: None (Preliminary result)   Collection Time: 07/16/21 10:40 AM   Specimen: BLOOD  Result Value Ref Range Status   Specimen Description BLOOD BLOOD RIGHT HAND  Final   Special Requests   Final    BOTTLES DRAWN AEROBIC AND ANAEROBIC Blood Culture results may not be optimal due to an inadequate volume of blood received in culture bottles   Culture   Final    NO GROWTH 2 DAYS Performed at [facility], [address] [place], [place] [address]    Report Status PENDING  Incomplete     Time coordinating discharge: Over 30 minutes  SIGNED:   Wyvonnia Dusky, MD  [facility] 07/18/2021, 12:09 PM Pager   If 7PM-7AM, please contact night-coverage

## 2021-07-21 LAB — CULTURE, BLOOD (ROUTINE X 2)
Culture: NO GROWTH
Culture: NO GROWTH

## 2021-07-22 ENCOUNTER — Inpatient Hospital Stay
Admission: EM | Admit: 2021-07-22 | Discharge: 2021-07-26 | DRG: 393 | Disposition: A | Discharge status: Home / Self Care | Payer: BC Managed Care – PPO | Attending: Obstetrics and Gynecology | Admitting: Obstetrics and Gynecology

## 2021-07-22 ENCOUNTER — Other Ambulatory Visit: Payer: Self-pay

## 2021-07-22 ENCOUNTER — Emergency Department: Payer: BC Managed Care – PPO

## 2021-07-22 DIAGNOSIS — E1140 Type 2 diabetes mellitus with diabetic neuropathy, unspecified: Secondary | ICD-10-CM | POA: Diagnosis present

## 2021-07-22 DIAGNOSIS — E162 Hypoglycemia, unspecified: Secondary | ICD-10-CM | POA: Diagnosis not present

## 2021-07-22 DIAGNOSIS — E119 Type 2 diabetes mellitus without complications: Secondary | ICD-10-CM

## 2021-07-22 DIAGNOSIS — I639 Cerebral infarction, unspecified: Secondary | ICD-10-CM

## 2021-07-22 DIAGNOSIS — R29810 Facial weakness: Secondary | ICD-10-CM | POA: Diagnosis not present

## 2021-07-22 DIAGNOSIS — R4781 Slurred speech: Secondary | ICD-10-CM | POA: Diagnosis not present

## 2021-07-22 DIAGNOSIS — K229 Disease of esophagus, unspecified: Secondary | ICD-10-CM | POA: Diagnosis present

## 2021-07-22 DIAGNOSIS — K912 Postsurgical malabsorption, not elsewhere classified: Secondary | ICD-10-CM

## 2021-07-22 DIAGNOSIS — Z8619 Personal history of other infectious and parasitic diseases: Secondary | ICD-10-CM

## 2021-07-22 DIAGNOSIS — Z7984 Long term (current) use of oral hypoglycemic drugs: Secondary | ICD-10-CM

## 2021-07-22 DIAGNOSIS — Z833 Family history of diabetes mellitus: Secondary | ICD-10-CM

## 2021-07-22 DIAGNOSIS — Z20822 Contact with and (suspected) exposure to covid-19: Secondary | ICD-10-CM | POA: Diagnosis present

## 2021-07-22 DIAGNOSIS — Z8249 Family history of ischemic heart disease and other diseases of the circulatory system: Secondary | ICD-10-CM

## 2021-07-22 DIAGNOSIS — Z79899 Other long term (current) drug therapy: Secondary | ICD-10-CM

## 2021-07-22 DIAGNOSIS — Z87440 Personal history of urinary (tract) infections: Secondary | ICD-10-CM

## 2021-07-22 DIAGNOSIS — E785 Hyperlipidemia, unspecified: Secondary | ICD-10-CM | POA: Diagnosis present

## 2021-07-22 DIAGNOSIS — Z794 Long term (current) use of insulin: Secondary | ICD-10-CM

## 2021-07-22 DIAGNOSIS — G9341 Metabolic encephalopathy: Secondary | ICD-10-CM

## 2021-07-22 DIAGNOSIS — K55069 Acute infarction of intestine, part and extent unspecified: Secondary | ICD-10-CM | POA: Diagnosis not present

## 2021-07-22 DIAGNOSIS — Z8673 Personal history of transient ischemic attack (TIA), and cerebral infarction without residual deficits: Secondary | ICD-10-CM

## 2021-07-22 DIAGNOSIS — R233 Spontaneous ecchymoses: Secondary | ICD-10-CM | POA: Diagnosis not present

## 2021-07-22 DIAGNOSIS — Z87898 Personal history of other specified conditions: Secondary | ICD-10-CM

## 2021-07-22 DIAGNOSIS — E11649 Type 2 diabetes mellitus with hypoglycemia without coma: Secondary | ICD-10-CM | POA: Diagnosis present

## 2021-07-22 DIAGNOSIS — I63511 Cerebral infarction due to unspecified occlusion or stenosis of right middle cerebral artery: Secondary | ICD-10-CM | POA: Diagnosis not present

## 2021-07-22 DIAGNOSIS — R29702 NIHSS score 2: Secondary | ICD-10-CM | POA: Diagnosis not present

## 2021-07-22 DIAGNOSIS — Z9049 Acquired absence of other specified parts of digestive tract: Secondary | ICD-10-CM

## 2021-07-22 DIAGNOSIS — I10 Essential (primary) hypertension: Secondary | ICD-10-CM | POA: Diagnosis present

## 2021-07-22 DIAGNOSIS — R200 Anesthesia of skin: Secondary | ICD-10-CM | POA: Diagnosis not present

## 2021-07-22 LAB — CBC WITH DIFFERENTIAL/PLATELET
Abs Immature Granulocytes: 0.18 10*3/uL — ABNORMAL HIGH (ref 0.00–0.07)
Basophils Absolute: 0 10*3/uL (ref 0.0–0.1)
Basophils Relative: 0 %
Eosinophils Absolute: 0 10*3/uL (ref 0.0–0.5)
Eosinophils Relative: 0 %
HCT: 30.1 % — ABNORMAL LOW (ref 36.0–46.0)
Hemoglobin: 9.9 g/dL — ABNORMAL LOW (ref 12.0–15.0)
Immature Granulocytes: 2 %
Lymphocytes Relative: 7 %
Lymphs Abs: 0.9 10*3/uL (ref 0.7–4.0)
MCH: 28.4 pg (ref 26.0–34.0)
MCHC: 32.9 g/dL (ref 30.0–36.0)
MCV: 86.5 fL (ref 80.0–100.0)
Monocytes Absolute: 0.7 10*3/uL (ref 0.1–1.0)
Monocytes Relative: 6 %
Neutro Abs: 10.5 10*3/uL — ABNORMAL HIGH (ref 1.7–7.7)
Neutrophils Relative %: 85 %
Platelets: 410 10*3/uL — ABNORMAL HIGH (ref 150–400)
RBC: 3.48 MIL/uL — ABNORMAL LOW (ref 3.87–5.11)
RDW: 13.6 % (ref 11.5–15.5)
WBC: 12.3 10*3/uL — ABNORMAL HIGH (ref 4.0–10.5)
nRBC: 0 % (ref 0.0–0.2)

## 2021-07-22 LAB — PROTIME-INR
INR: 1.1 (ref 0.8–1.2)
Prothrombin Time: 14.3 seconds (ref 11.4–15.2)

## 2021-07-22 LAB — BASIC METABOLIC PANEL
Anion gap: 10 (ref 5–15)
BUN: 19 mg/dL (ref 6–20)
CO2: 22 mmol/L (ref 22–32)
Calcium: 8.2 mg/dL — ABNORMAL LOW (ref 8.9–10.3)
Chloride: 100 mmol/L (ref 98–111)
Creatinine, Ser: 0.68 mg/dL (ref 0.44–1.00)
GFR, Estimated: >60 mL/min (ref >60)
Glucose, Bld: 219 mg/dL — ABNORMAL HIGH (ref 70–99)
Potassium: 3.2 mmol/L — ABNORMAL LOW (ref 3.5–5.1)
Sodium: 132 mmol/L — ABNORMAL LOW (ref 135–145)

## 2021-07-22 LAB — CBG MONITORING, ED
Glucose-Capillary: 72 mg/dL (ref 70–99)
Glucose-Capillary: 39 mg/dL — CL (ref 70–99)
Glucose-Capillary: 117 mg/dL — ABNORMAL HIGH (ref 70–99)
Glucose-Capillary: 107 mg/dL — ABNORMAL HIGH (ref 70–99)

## 2021-07-22 LAB — APTT: aPTT: 40 seconds — ABNORMAL HIGH (ref 24–36)

## 2021-07-22 MED ORDER — HEPARIN BOLUS VIA INFUSION
4000.0000 [IU] | Freq: Once | INTRAVENOUS | Status: AC
  Administered 2021-07-23: 4000 [IU] via INTRAVENOUS
  Filled 2021-07-22: qty 4000

## 2021-07-22 MED ORDER — HEPARIN (PORCINE) 25000 UT/250ML-% IV SOLN
1050.0000 [IU]/h | INTRAVENOUS | Status: DC
  Administered 2021-07-23 (×2): 1050 [IU]/h via INTRAVENOUS
  Filled 2021-07-22 (×2): qty 250

## 2021-07-22 MED ORDER — IOHEXOL 300 MG/ML  SOLN
100.0000 mL | Freq: Once | INTRAMUSCULAR | Status: AC | PRN
  Administered 2021-07-22: 100 mL via INTRAVENOUS

## 2021-07-22 MED ORDER — DEXTROSE 50 % IV SOLN
INTRAVENOUS | Status: AC
  Administered 2021-07-22: 25 g via INTRAVENOUS
  Filled 2021-07-22: qty 50

## 2021-07-22 MED ORDER — DEXTROSE 50 % IV SOLN: 25.0000 g | Freq: Once | INTRAVENOUS | Status: AC

## 2021-07-22 NOTE — ED Provider Notes (Signed)
[facility] Provider Note    Event Date/Time   First MD Initiated Contact with Patient 07/22/21 1952     (approximate)   History   Hypoglycemia   HPI  Nicole Villegas is a 56 y.o. female  who, per discharge summery dated 12.31.22 has history of HTN, HLD, DM, GERD and recent hospitalization for acute appendicitis, presents to the emergency department because of concern for altered mental status. Patient herself has some confusion so cannot give a great history, history primarily obtained from family at bedside. Patient had recent admission for appendicitis and had follow up appointment with surgery. After that appointment however they noticed the patient became weaker. She then was not responding to their questions. She does have history of DM however family had run out of test strips so could not check sugar. When EMS arrived they noted patient was hypoglycemic. Family does state patient has had decreased oral intake recently.       Physical Exam   Triage Vital Signs: ED Triage Vitals  Enc Vitals Group     BP 07/22/21 1934 122/78     Pulse Rate 07/22/21 1934 85     Resp 07/22/21 1934 20     Temp 07/22/21 1934 97.6 F (36.4 C)     Temp Source 07/22/21 1934 Oral     SpO2 07/22/21 1934 100 %     Weight --      Height --      Head Circumference --      Peak Flow --      Pain Score 07/22/21 1935 0     Pain Loc --      Pain Edu? --      Excl. in GC? --     Most recent vital signs: Vitals:   07/22/21 1934  BP: 122/78  Pulse: 85  Resp: 20  Temp: 97.6 F (36.4 C)  SpO2: 100%    General: Awake, alert. Confused. Not completely understanding questions. CV:  Good peripheral perfusion.  Resp:  Normal effort.  Abd:  No distention. No significant tenderness. Skin:  Surgical incision sites c/d/i   ED Results / Procedures / Treatments   Labs (all labs ordered are listed, but only abnormal results are displayed) Labs Reviewed  CBC WITH  DIFFERENTIAL/PLATELET - Abnormal; Notable for the following components:      Result Value   WBC 12.3 (*)    RBC 3.48 (*)    Hemoglobin 9.9 (*)    HCT 30.1 (*)    Platelets 410 (*)    Neutro Abs 10.5 (*)    Abs Immature Granulocytes 0.18 (*)    All other components within normal limits  BASIC METABOLIC PANEL - Abnormal; Notable for the following components:   Sodium 132 (*)    Potassium 3.2 (*)    Glucose, Bld 219 (*)    Calcium 8.2 (*)    All other components within normal limits  CBG MONITORING, ED - Abnormal; Notable for the following components:   Glucose-Capillary 39 (*)    All other components within normal limits  CBG MONITORING, ED - Abnormal; Notable for the following components:   Glucose-Capillary 117 (*)    All other components within normal limits  CBG MONITORING, ED - Abnormal; Notable for the following components:   Glucose-Capillary 107 (*)    All other components within normal limits  RESP PANEL BY RT-PCR (FLU A&B, COVID) ARPGX2  URINALYSIS, COMPLETE (UACMP) WITH MICROSCOPIC  PROTIME-INR  APTT  HEPARIN LEVEL (UNFRACTIONATED)  CBC  CBG MONITORING, ED  CBG MONITORING, ED  CBG MONITORING, ED  CBG MONITORING, ED  CBG MONITORING, ED  CBG MONITORING, ED  CBG MONITORING, ED  CBG MONITORING, ED  CBG MONITORING, ED  CBG MONITORING, ED  CBG MONITORING, ED  CBG MONITORING, ED  CBG MONITORING, ED  CBG MONITORING, ED  CBG MONITORING, ED  CBG MONITORING, ED  CBG MONITORING, ED  CBG MONITORING, ED  CBG MONITORING, ED  CBG MONITORING, ED  CBG MONITORING, ED  CBG MONITORING, ED  CBG MONITORING, ED  CBG MONITORING, ED  CBG MONITORING, ED     EKG  None   RADIOLOGY CT abd/pel My interpretation: No abscess Radiology interpretation:  IMPRESSION:  1. 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 Mild adjacent peri  venous stranding. There is no thrombus extension into the portal or   splenic veins.  2. Interval appendectomy. No abscess.  3. Mild small bowel hyperemia. No obstruction or ileus. Liquid  stool/fluid in the colon.  4. Mild distal esophageal wall thickening, can be seen with reflux  or esophagitis.  5. Mild heterogeneous hepatic perfusion, nonspecific.     PROCEDURES:  Critical Care performed: No  Procedures   MEDICATIONS ORDERED IN ED: Medications - No data to display   IMPRESSION / MDM / [place] / ED COURSE  I reviewed the triage vital signs and the nursing notes.                              Differential diagnosis includes, but is not limited to, hypoglycemia due to infection, poor oral intake, medication misadventure, post surgical abscess/infection.   Patient presented to the emergency department today because of concerns for decreased responsiveness.  Patient was found to be hypoglycemic.  Initial blood sugar here was quite low.  Patient's blood work does show mild leukocytosis.  She has not had quite the same oral intake is normal which I think is likely due to her recent appendectomy.  However given mild leukocytosis and low-grade did have concern for possible post operative infection causing some of the hypoglycemia.  Because of this a CT angio was performed.  This did show an occlusive thrombus of the SMV.  Discussed with Dr. Delana Meyer with vascular surgery.  Will plan on starting heparin.  Discussed the case with the hospitalist who will plan on admitting patient.   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    Nance Pear, MD 07/23/21 Dyann Kief

## 2021-07-22 NOTE — ED Triage Notes (Signed)
Arrived via EMS for hypoglycemia. Initial BGL 33, patient cool, diaphoretic and minimally responsive on arrival. 12.5 G D 50 given IV to 20 G to left hand. BGL increased to 132 and patient became AOX4. Patient AOX4 on arrival. Resp even, unlabored on RA.

## 2021-07-22 NOTE — Consult Note (Signed)
ANTICOAGULATION CONSULT NOTE - Initial Consult  Pharmacy Consult for heparin infusion Indication: DVT -- mesenteric vein thrombus  No Known Allergies  Patient Measurements:   Heparin Dosing Weight: 61.4 kg  Vital Signs: Temp: 97.6 F (36.4 C) (01/04 1934) Temp Source: Oral (01/04 1934) BP: 149/87 (01/04 2136) Pulse Rate: 85 (01/04 2136)  Labs: Recent Labs    07/22/21 2112  HGB 9.9*  HCT 30.1*  PLT 410*  CREATININE 0.68    Estimated Creatinine Clearance: 68.6 mL/min (by C-G formula based on SCr of 0.68 mg/dL).   Medical History: Past Medical History:  Diagnosis Date   Diabetes mellitus without complication (HCC)    Hyperlipidemia    Hypertension     Medications:  No prior anticoagulation noted   Assessment: 56 y.o female arrived via EMS for hypoglycemia. 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   Goal of Therapy:  Heparin level 0.3-0.7 units/ml Monitor platelets by anticoagulation protocol: Yes   Plan:  Give 4000 units bolus x 1 Start heparin infusion at 1050 units/hr Check anti-Xa level in 6 hours  Continue to monitor H&H and platelets  Sharen Hones, PharmD, BCPS Clinical Pharmacist   07/22/2021,11:28 PM

## 2021-07-23 DIAGNOSIS — Z8619 Personal history of other infectious and parasitic diseases: Secondary | ICD-10-CM | POA: Diagnosis not present

## 2021-07-23 DIAGNOSIS — E11649 Type 2 diabetes mellitus with hypoglycemia without coma: Secondary | ICD-10-CM | POA: Diagnosis present

## 2021-07-23 DIAGNOSIS — E1140 Type 2 diabetes mellitus with diabetic neuropathy, unspecified: Secondary | ICD-10-CM | POA: Diagnosis present

## 2021-07-23 DIAGNOSIS — Z87440 Personal history of urinary (tract) infections: Secondary | ICD-10-CM | POA: Diagnosis not present

## 2021-07-23 DIAGNOSIS — E162 Hypoglycemia, unspecified: Secondary | ICD-10-CM | POA: Diagnosis present

## 2021-07-23 DIAGNOSIS — I63511 Cerebral infarction due to unspecified occlusion or stenosis of right middle cerebral artery: Secondary | ICD-10-CM | POA: Diagnosis not present

## 2021-07-23 DIAGNOSIS — K912 Postsurgical malabsorption, not elsewhere classified: Secondary | ICD-10-CM

## 2021-07-23 DIAGNOSIS — R200 Anesthesia of skin: Secondary | ICD-10-CM | POA: Diagnosis not present

## 2021-07-23 DIAGNOSIS — K55069 Acute infarction of intestine, part and extent unspecified: Secondary | ICD-10-CM | POA: Diagnosis present

## 2021-07-23 DIAGNOSIS — R29702 NIHSS score 2: Secondary | ICD-10-CM | POA: Diagnosis not present

## 2021-07-23 DIAGNOSIS — E785 Hyperlipidemia, unspecified: Secondary | ICD-10-CM | POA: Diagnosis present

## 2021-07-23 DIAGNOSIS — I6389 Other cerebral infarction: Secondary | ICD-10-CM | POA: Diagnosis not present

## 2021-07-23 DIAGNOSIS — Z7984 Long term (current) use of oral hypoglycemic drugs: Secondary | ICD-10-CM | POA: Diagnosis not present

## 2021-07-23 DIAGNOSIS — E119 Type 2 diabetes mellitus without complications: Secondary | ICD-10-CM

## 2021-07-23 DIAGNOSIS — Z8249 Family history of ischemic heart disease and other diseases of the circulatory system: Secondary | ICD-10-CM | POA: Diagnosis not present

## 2021-07-23 DIAGNOSIS — Z9049 Acquired absence of other specified parts of digestive tract: Secondary | ICD-10-CM

## 2021-07-23 DIAGNOSIS — Z794 Long term (current) use of insulin: Secondary | ICD-10-CM | POA: Diagnosis not present

## 2021-07-23 DIAGNOSIS — Z79899 Other long term (current) drug therapy: Secondary | ICD-10-CM | POA: Diagnosis not present

## 2021-07-23 DIAGNOSIS — G9341 Metabolic encephalopathy: Secondary | ICD-10-CM | POA: Diagnosis present

## 2021-07-23 DIAGNOSIS — R29810 Facial weakness: Secondary | ICD-10-CM | POA: Diagnosis not present

## 2021-07-23 DIAGNOSIS — Z20822 Contact with and (suspected) exposure to covid-19: Secondary | ICD-10-CM | POA: Diagnosis present

## 2021-07-23 DIAGNOSIS — R233 Spontaneous ecchymoses: Secondary | ICD-10-CM | POA: Diagnosis not present

## 2021-07-23 DIAGNOSIS — I10 Essential (primary) hypertension: Secondary | ICD-10-CM | POA: Diagnosis present

## 2021-07-23 DIAGNOSIS — Z87898 Personal history of other specified conditions: Secondary | ICD-10-CM

## 2021-07-23 DIAGNOSIS — R4781 Slurred speech: Secondary | ICD-10-CM | POA: Diagnosis not present

## 2021-07-23 DIAGNOSIS — Z833 Family history of diabetes mellitus: Secondary | ICD-10-CM | POA: Diagnosis not present

## 2021-07-23 DIAGNOSIS — Z8673 Personal history of transient ischemic attack (TIA), and cerebral infarction without residual deficits: Secondary | ICD-10-CM | POA: Diagnosis not present

## 2021-07-23 DIAGNOSIS — K229 Disease of esophagus, unspecified: Secondary | ICD-10-CM | POA: Diagnosis present

## 2021-07-23 LAB — BASIC METABOLIC PANEL
Anion gap: 9 (ref 5–15)
BUN: 13 mg/dL (ref 6–20)
CO2: 26 mmol/L (ref 22–32)
Calcium: 8.4 mg/dL — ABNORMAL LOW (ref 8.9–10.3)
Chloride: 103 mmol/L (ref 98–111)
Creatinine, Ser: 0.65 mg/dL (ref 0.44–1.00)
GFR, Estimated: >60 mL/min (ref >60)
Glucose, Bld: 191 mg/dL — ABNORMAL HIGH (ref 70–99)
Potassium: 4 mmol/L (ref 3.5–5.1)
Sodium: 138 mmol/L (ref 135–145)

## 2021-07-23 LAB — RESP PANEL BY RT-PCR (FLU A&B, COVID) ARPGX2
Influenza A by PCR: NEGATIVE
Influenza B by PCR: NEGATIVE
SARS Coronavirus 2 by RT PCR: NEGATIVE

## 2021-07-23 LAB — CBC
HCT: 29.1 % — ABNORMAL LOW (ref 36.0–46.0)
Hemoglobin: 9.4 g/dL — ABNORMAL LOW (ref 12.0–15.0)
MCH: 27.8 pg (ref 26.0–34.0)
MCHC: 32.3 g/dL (ref 30.0–36.0)
MCV: 86.1 fL (ref 80.0–100.0)
Platelets: 538 10*3/uL — ABNORMAL HIGH (ref 150–400)
RBC: 3.38 MIL/uL — ABNORMAL LOW (ref 3.87–5.11)
RDW: 13.7 % (ref 11.5–15.5)
WBC: 12.2 10*3/uL — ABNORMAL HIGH (ref 4.0–10.5)
nRBC: 0 % (ref 0.0–0.2)

## 2021-07-23 LAB — URINALYSIS, COMPLETE (UACMP) WITH MICROSCOPIC
Bacteria, UA: NONE SEEN
Bilirubin Urine: NEGATIVE
Glucose, UA: 100 mg/dL — AB
Hgb urine dipstick: NEGATIVE
Ketones, ur: NEGATIVE mg/dL
Leukocytes,Ua: NEGATIVE
Nitrite: NEGATIVE
Protein, ur: NEGATIVE mg/dL
Specific Gravity, Urine: <1.005 — ABNORMAL LOW (ref 1.005–1.030)
pH: 5 (ref 5.0–8.0)

## 2021-07-23 LAB — CBG MONITORING, ED
Glucose-Capillary: 141 mg/dL — ABNORMAL HIGH (ref 70–99)
Glucose-Capillary: 162 mg/dL — ABNORMAL HIGH (ref 70–99)
Glucose-Capillary: 163 mg/dL — ABNORMAL HIGH (ref 70–99)
Glucose-Capillary: 164 mg/dL — ABNORMAL HIGH (ref 70–99)
Glucose-Capillary: 184 mg/dL — ABNORMAL HIGH (ref 70–99)
Glucose-Capillary: 255 mg/dL — ABNORMAL HIGH (ref 70–99)
Glucose-Capillary: 272 mg/dL — ABNORMAL HIGH (ref 70–99)
Glucose-Capillary: 68 mg/dL — ABNORMAL LOW (ref 70–99)

## 2021-07-23 LAB — HEPARIN LEVEL (UNFRACTIONATED)
Heparin Unfractionated: 0.35 IU/mL (ref 0.30–0.70)
Heparin Unfractionated: 0.33 IU/mL (ref 0.30–0.70)

## 2021-07-23 MED ORDER — ONDANSETRON HCL 4 MG/2ML IJ SOLN
4.0000 mg | Freq: Four times a day (QID) | INTRAMUSCULAR | Status: DC | PRN
  Administered 2021-07-23: 4 mg via INTRAVENOUS
  Filled 2021-07-23: qty 2

## 2021-07-23 MED ORDER — KCL IN DEXTROSE-NACL 40-5-0.9 MEQ/L-%-% IV SOLN
INTRAVENOUS | Status: DC
  Filled 2021-07-23 (×4): qty 1000

## 2021-07-23 MED ORDER — ACETAMINOPHEN 325 MG PO TABS
650.0000 mg | ORAL_TABLET | Freq: Four times a day (QID) | ORAL | Status: DC | PRN
  Administered 2021-07-24 – 2021-07-25 (×2): 650 mg via ORAL
  Filled 2021-07-23 (×2): qty 2

## 2021-07-23 MED ORDER — INSULIN ASPART 100 UNIT/ML IJ SOLN
0.0000 [IU] | Freq: Three times a day (TID) | INTRAMUSCULAR | Status: DC
  Administered 2021-07-23: 8 [IU] via SUBCUTANEOUS
  Administered 2021-07-24: 13:00:00 5 [IU] via SUBCUTANEOUS
  Administered 2021-07-24: 18:00:00 3 [IU] via SUBCUTANEOUS
  Administered 2021-07-24: 09:00:00 5 [IU] via SUBCUTANEOUS
  Administered 2021-07-25 (×2): 3 [IU] via SUBCUTANEOUS
  Administered 2021-07-25: 09:00:00 2 [IU] via SUBCUTANEOUS
  Administered 2021-07-26: 1 [IU] via SUBCUTANEOUS
  Filled 2021-07-23 (×8): qty 1

## 2021-07-23 MED ORDER — SIMETHICONE 80 MG PO CHEW
160.0000 mg | CHEWABLE_TABLET | Freq: Once | ORAL | Status: AC
  Administered 2021-07-23: 160 mg via ORAL
  Filled 2021-07-23: qty 2

## 2021-07-23 MED ORDER — ATORVASTATIN CALCIUM 20 MG PO TABS
80.0000 mg | ORAL_TABLET | Freq: Every day | ORAL | Status: DC
  Administered 2021-07-23 – 2021-07-24 (×2): 80 mg via ORAL
  Filled 2021-07-23 (×2): qty 4

## 2021-07-23 MED ORDER — INSULIN ASPART 100 UNIT/ML IJ SOLN
0.0000 [IU] | Freq: Every day | INTRAMUSCULAR | Status: DC
  Administered 2021-07-23: 21:00:00 3 [IU] via SUBCUTANEOUS
  Administered 2021-07-24 – 2021-07-25 (×2): 2 [IU] via SUBCUTANEOUS
  Filled 2021-07-23 (×3): qty 1

## 2021-07-23 MED ORDER — ONDANSETRON HCL 4 MG PO TABS: 4.0000 mg | ORAL_TABLET | Freq: Four times a day (QID) | ORAL | Status: DC | PRN

## 2021-07-23 MED ORDER — INSULIN GLARGINE-YFGN 100 UNIT/ML ~~LOC~~ SOLN
30.0000 [IU] | Freq: Every day | SUBCUTANEOUS | Status: DC
  Administered 2021-07-23 – 2021-07-25 (×3): 30 [IU] via SUBCUTANEOUS
  Filled 2021-07-23 (×4): qty 0.3

## 2021-07-23 MED ORDER — ACETAMINOPHEN 650 MG RE SUPP: 650.0000 mg | Freq: Four times a day (QID) | RECTAL | Status: DC | PRN

## 2021-07-23 MED ORDER — PANTOPRAZOLE SODIUM 40 MG PO TBEC
40.0000 mg | DELAYED_RELEASE_TABLET | Freq: Every day | ORAL | Status: DC
  Administered 2021-07-23 – 2021-07-26 (×4): 40 mg via ORAL
  Filled 2021-07-23 (×4): qty 1

## 2021-07-23 MED ORDER — GABAPENTIN 300 MG PO CAPS
300.0000 mg | ORAL_CAPSULE | Freq: Two times a day (BID) | ORAL | Status: DC
  Administered 2021-07-23 – 2021-07-26 (×7): 300 mg via ORAL
  Filled 2021-07-23 (×8): qty 1

## 2021-07-23 MED ORDER — NORTRIPTYLINE HCL 10 MG PO CAPS
20.0000 mg | ORAL_CAPSULE | Freq: Every day | ORAL | Status: DC
  Administered 2021-07-23 – 2021-07-25 (×3): 20 mg via ORAL
  Filled 2021-07-23 (×5): qty 2

## 2021-07-23 MED ORDER — MORPHINE SULFATE (PF) 2 MG/ML IV SOLN
2.0000 mg | INTRAVENOUS | Status: DC | PRN
  Administered 2021-07-23 (×2): 2 mg via INTRAVENOUS
  Filled 2021-07-23 (×2): qty 1

## 2021-07-23 MED ORDER — DEXTROSE 50 % IV SOLN: 25.0000 mL | Freq: Once | INTRAVENOUS | Status: DC

## 2021-07-23 MED ORDER — ATENOLOL 50 MG PO TABS
50.0000 mg | ORAL_TABLET | Freq: Every day | ORAL | Status: DC
  Administered 2021-07-23 – 2021-07-26 (×4): 50 mg via ORAL
  Filled 2021-07-23 (×2): qty 1
  Filled 2021-07-23: qty 2
  Filled 2021-07-23: qty 1

## 2021-07-23 MED ORDER — DEXTROSE 50 % IV SOLN
INTRAVENOUS | Status: AC
  Administered 2021-07-23: 25 mL
  Filled 2021-07-23: qty 50

## 2021-07-23 NOTE — ED Notes (Signed)
Pt resting comfortably in bed, NAD; son at [facility]. No needs verbalized at this time. Pt & son updated on POC. Bed low & locked; call light & personal items within reach.

## 2021-07-23 NOTE — ED Notes (Signed)
This RN attempted to draw blood work from the PIV pt has; unable to obtain labs from PIV. This RN called lab to request phlebotomist draw heparin level. Lab to draw.  PT at [facility].

## 2021-07-23 NOTE — Progress Notes (Signed)
Patient with incidental finding of thrombus in the superior mesenteric vein status post appendectomy associated with sepsis.  Recommendation is for heparin for 48 to 72 hours and see how she tolerates p.o. intake then she can be transitioned to Eliquis or Xarelto.  Was not informed that she needed interpreter and I will return when that can be arranged for a full consultation.  In the meantime I have DC'd the n.p.o. and begun on a carb controlled diet as there is no possibility for thrombectomy of the mesenteric or portal veins.

## 2021-07-23 NOTE — Progress Notes (Signed)
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   Assessment & Plan:   Principal Problem:   Mesenteric vein thrombosis (HCC) Active Problems:   Hypertension   Acute metabolic encephalopathy   S/P laparoscopic appendectomy   Diabetes mellitus without complication (HCC)   Hypoglycemia due to type 2 diabetes mellitus ([place])   History of bacteremia  # Hypoglycemia # Acute encephalopathy # T2DM Encephalopathy 2/2 hypoglycemia, both resolved. Hypoglycemia likely 2/2 to insulin use in setting of decreased PO after recent surgery - frequent glucose checks - dm educator consult - ssi moderate - hold home meds - PT consult  # Mesenteric thrombosis Incidental on CT scan. Mild abd pain, low suspicion for ischemia - serial abd exams - monitor lactate - vascular consulted, advising heparin for now, then transition to doac, likely for 6 months  # HTN Here bp wnl in setting of the above - hold home amlodipine and lisin/hctz for now - cont home atenolol, atorvastatin  # Neuropathy - cont home gabapentin, nortriptyline   DVT prophylaxis: heparin Code Status: full Family Communication: son updated @ bedside 1/5  Level of care: med/surg Status is: Inpatient  Remains inpatient appropriate because: severity of illness        Consultants:  vascular  Procedures: none  Antimicrobials:  none    Subjective: Mild abd pain, no n/v/d.  Objective: Vitals:   07/23/21 0400 07/23/21 0630 07/23/21 0700 07/23/21 0730  BP: 137/68 116/67 118/64 (!) 141/80  Pulse: 91 89 96 98  Resp: 13 17 20 16   Temp:      TempSrc:      SpO2: 100%  96% 98% 100%   No intake or output data in the 24 hours ending 07/23/21 0909 There were no vitals filed for this visit.  Examination:  General exam: Appears calm and comfortable  Respiratory system: Clear to auscultation. Respiratory effort normal. Cardiovascular system: S1 & S2 heard, RRR. No JVD, murmurs, rubs, gallops or clicks. No pedal edema. Gastrointestinal system: Abdomen is nondistended, soft and nontender. No organomegaly or masses felt. Normal bowel sounds heard. Mild diffuse ttp. Healing lap port sites Central nervous system: Alert and oriented. No focal neurological deficits. Extremities: Symmetric 5 x 5 power. Skin: No rashes, lesions or ulcers Psychiatry: Judgement and insight appear normal. Mood & affect appropriate.     Data Reviewed: I have personally reviewed following labs and imaging studies  CBC: Recent Labs  Lab 07/17/21 0530 07/18/21 0538 07/22/21 2112 07/23/21 0633  WBC 6.4 7.4 12.3* 12.2*  NEUTROABS  --   --  10.5*  --   HGB 12.4 11.4* 9.9* 9.4*  HCT 36.4 33.5* 30.1* 29.1*  MCV 83.3 82.5 86.5 86.1  PLT 141* 153 410* [id number]*   Basic Metabolic Panel: Recent Labs  Lab 07/17/21 0530 07/18/21 0538 07/22/21 2112 07/23/21 0633  NA 132* 133* 132* 138  K 3.5 3.6 3.2* 4.0  CL 99 100 100 103  CO2 23 27 22 26   GLUCOSE 186* 210* 219* 191*  BUN 12 13 19 13   CREATININE 0.51 0.71 0.68 0.65  CALCIUM 8.4* 8.3* 8.2* 8.4*   GFR: Estimated Creatinine Clearance: 68.6 mL/min (by C-G formula based on SCr of 0.65 mg/dL). Liver Function Tests: No  results for input(s): AST, ALT, ALKPHOS, BILITOT, PROT, ALBUMIN in the last 168 hours. No results for input(s): LIPASE, AMYLASE in the last 168 hours. No results for input(s): AMMONIA in the last 168 hours. Coagulation Profile: Recent Labs  Lab 07/22/21 2326  INR 1.1   Cardiac Enzymes: No results for input(s): CKTOTAL, CKMB, CKMBINDEX, TROPONINI in the last 168 hours. BNP (last 3 results) No results for input(s):  PROBNP in the last 8760 hours. HbA1C: No results for input(s): HGBA1C in the last 72 hours. CBG: Recent Labs  Lab 07/23/21 0309 07/23/21 0433 07/23/21 0543 07/23/21 0637 07/23/21 0750  GLUCAP 141* 162* 163* 184* 164*   Lipid Profile: No results for input(s): CHOL, HDL, LDLCALC, TRIG, CHOLHDL, LDLDIRECT in the last 72 hours. Thyroid Function Tests: No results for input(s): TSH, T4TOTAL, FREET4, T3FREE, THYROIDAB in the last 72 hours. Anemia Panel: No results for input(s): VITAMINB12, FOLATE, FERRITIN, TIBC, IRON, RETICCTPCT in the last 72 hours. Urine analysis:    Component Value Date/Time   COLORURINE YELLOW 07/22/2021 2325   APPEARANCEUR CLEAR 07/22/2021 2325   APPEARANCEUR Clear 05/05/2018 1336   LABSPEC <1.005 (L) 07/22/2021 2325   PHURINE 5.0 07/22/2021 2325   GLUCOSEU 100 (A) 07/22/2021 2325   HGBUR NEGATIVE 07/22/2021 2325   BILIRUBINUR NEGATIVE 07/22/2021 2325   BILIRUBINUR Negative 05/05/2018 [place] 07/22/2021 2325   PROTEINUR NEGATIVE 07/22/2021 2325   NITRITE NEGATIVE 07/22/2021 2325   LEUKOCYTESUR NEGATIVE 07/22/2021 2325   Sepsis Labs: @LABRCNTIP (procalcitonin:4,lacticidven:4)  ) Recent Results (from the past 240 hour(s))  Urine Culture     Status: Abnormal   Collection Time: 07/14/21 11:39 AM   Specimen: Urine, Random  Result Value Ref Range Status   Specimen Description   Final    URINE, RANDOM Performed at [facility], [address] [place], [place] [address]    Special Requests   Final    NONE Performed at [facility], [address] [place], [place] [address]    Culture >=100,000 COLONIES/mL ESCHERICHIA COLI (A)  Final   Report Status 07/16/2021 FINAL  Final   Organism ID, Bacteria ESCHERICHIA COLI (A)  Final      Susceptibility   Escherichia coli - MIC*    AMPICILLIN >=32 RESISTANT Resistant     CEFAZOLIN <=4 SENSITIVE Sensitive     CEFEPIME <=0.12 SENSITIVE Sensitive     CEFTRIAXONE <=0.25  SENSITIVE Sensitive     CIPROFLOXACIN >=4 RESISTANT Resistant     GENTAMICIN <=1 SENSITIVE Sensitive     IMIPENEM <=0.25 SENSITIVE Sensitive     NITROFURANTOIN <=16 SENSITIVE Sensitive     TRIMETH/SULFA <=20 SENSITIVE Sensitive     AMPICILLIN/SULBACTAM 16 INTERMEDIATE Intermediate     PIP/TAZO <=4 SENSITIVE Sensitive     * >=100,000 COLONIES/mL ESCHERICHIA COLI  Resp Panel by RT-PCR (Flu A&B, Covid) Nasopharyngeal Swab     Status: None   Collection Time: 07/14/21 12:19 PM   Specimen: Nasopharyngeal Swab; Nasopharyngeal(NP) swabs in vial transport medium  Result Value Ref Range Status   SARS Coronavirus 2 by RT PCR NEGATIVE NEGATIVE Final    Comment: (NOTE) SARS-CoV-2 target nucleic acids are NOT DETECTED.  The SARS-CoV-2 RNA is generally detectable in upper respiratory specimens during the acute phase of infection. The lowest concentration of SARS-CoV-2 viral copies this assay can detect is 138 copies/mL. A negative result does not preclude SARS-Cov-2 infection and should not be used as the sole basis for treatment or other patient management decisions. A negative result  may occur with  improper specimen collection/handling, submission of specimen other than nasopharyngeal swab, presence of viral mutation(s) within the areas targeted by this assay, and inadequate number of viral copies(<138 copies/mL). A negative result must be combined with clinical observations, patient history, and epidemiological information. The expected result is Negative.  Fact Sheet for Patients:  EntrepreneurPulse.com.au  Fact Sheet for Healthcare Providers:  IncredibleEmployment.be  This test is no t yet approved or cleared by the [place] FDA and  has been authorized for detection and/or diagnosis of SARS-CoV-2 by FDA under an Emergency Use Authorization (EUA). This EUA will remain  in effect (meaning this test can be used) for the duration of the COVID-19  declaration under Section 564(b)(1) of the Act, 21 U.S.C.section 360bbb-3(b)(1), unless the authorization is terminated  or revoked sooner.       Influenza A by PCR NEGATIVE NEGATIVE Final   Influenza B by PCR NEGATIVE NEGATIVE Final    Comment: (NOTE) The Xpert Xpress SARS-CoV-2/FLU/RSV plus assay is intended as an aid in the diagnosis of influenza from Nasopharyngeal swab specimens and should not be used as a sole basis for treatment. Nasal washings and aspirates are unacceptable for Xpert Xpress SARS-CoV-2/FLU/RSV testing.  Fact Sheet for Patients: EntrepreneurPulse.com.au  Fact Sheet for Healthcare Providers: IncredibleEmployment.be  This test is not yet approved or cleared by the [place] FDA and has been authorized for detection and/or diagnosis of SARS-CoV-2 by FDA under an Emergency Use Authorization (EUA). This EUA will remain in effect (meaning this test can be used) for the duration of the COVID-19 declaration under Section 564(b)(1) of the Act, 21 U.S.C. section 360bbb-3(b)(1), unless the authorization is terminated or revoked.  Performed at [facility], [address] [place], [place] [address]   Culture, blood (Routine x 2)     Status: Abnormal   Collection Time: 07/14/21 12:19 PM   Specimen: BLOOD  Result Value Ref Range Status   Specimen Description   Final    BLOOD RIGHT ANTECUBITAL Performed at [facility], [address], [place], [place] [address]    Special Requests   Final    BOTTLES DRAWN AEROBIC AND ANAEROBIC Blood Culture adequate volume Performed at [facility], [place], [place] [address]    Culture  Setup Time   Final    CORRECTED RESULTS GRAM NEGATIVE RODS PREVIOUSLY REPORTED AS: GRAM NEGATIVE COCCI CORRECTED RESULTS CALLED TO: PHARMD MORGAN H 1420 [id number] FCP ANAEROBIC BOTTLE ONLY    Culture ESCHERICHIA COLI (A)  Final   Report Status 07/17/2021  FINAL  Final   Organism ID, Bacteria ESCHERICHIA COLI  Final      Susceptibility   Escherichia coli - MIC*    AMPICILLIN >=32 RESISTANT Resistant     CEFAZOLIN <=4 SENSITIVE Sensitive     CEFEPIME <=0.12 SENSITIVE Sensitive     CEFTAZIDIME <=1 SENSITIVE Sensitive     CEFTRIAXONE <=0.25 SENSITIVE Sensitive     CIPROFLOXACIN >=4 RESISTANT Resistant     GENTAMICIN <=1 SENSITIVE Sensitive     IMIPENEM <=0.25 SENSITIVE Sensitive     TRIMETH/SULFA <=20 SENSITIVE Sensitive     AMPICILLIN/SULBACTAM 16 INTERMEDIATE Intermediate     PIP/TAZO <=4 SENSITIVE Sensitive     * ESCHERICHIA COLI  Blood Culture ID Panel (Reflexed)     Status: Abnormal   Collection Time: 07/14/21 12:19 PM  Result Value Ref Range Status   Enterococcus faecalis NOT DETECTED NOT DETECTED Final   Enterococcus Faecium NOT DETECTED NOT  DETECTED Final   Listeria monocytogenes NOT DETECTED NOT DETECTED Final   Staphylococcus species NOT DETECTED NOT DETECTED Final   Staphylococcus aureus (BCID) NOT DETECTED NOT DETECTED Final   Staphylococcus epidermidis NOT DETECTED NOT DETECTED Final   Staphylococcus lugdunensis NOT DETECTED NOT DETECTED Final   Streptococcus species NOT DETECTED NOT DETECTED Final   Streptococcus agalactiae NOT DETECTED NOT DETECTED Final   Streptococcus pneumoniae NOT DETECTED NOT DETECTED Final   Streptococcus pyogenes NOT DETECTED NOT DETECTED Final   A.calcoaceticus-baumannii NOT DETECTED NOT DETECTED Final   Bacteroides fragilis DETECTED (A) NOT DETECTED Final    Comment: RESULT CALLED TO, READ BACK BY AND VERIFIED WITH: NATHAN BELUE 07/15/21 @ 0042 BY SB    Enterobacterales DETECTED (A) NOT DETECTED Final    Comment: Enterobacterales represent a large order of gram negative bacteria, not a single organism. RESULT CALLED TO, READ BACK BY AND VERIFIED WITH: NATHAN BELUE 07/15/21 @ 0042 BY SB    Enterobacter cloacae complex NOT DETECTED NOT DETECTED Final   Escherichia coli DETECTED (A) NOT  DETECTED Final    Comment: RESULT CALLED TO, READ BACK BY AND VERIFIED WITH: NATHAN BELUE 07/15/21 @ 0042 BY SB    Klebsiella aerogenes NOT DETECTED NOT DETECTED Final   Klebsiella oxytoca NOT DETECTED NOT DETECTED Final   Klebsiella pneumoniae NOT DETECTED NOT DETECTED Final   Proteus species NOT DETECTED NOT DETECTED Final   Salmonella species NOT DETECTED NOT DETECTED Final   Serratia marcescens NOT DETECTED NOT DETECTED Final   Haemophilus influenzae NOT DETECTED NOT DETECTED Final   Neisseria meningitidis NOT DETECTED NOT DETECTED Final   Pseudomonas aeruginosa NOT DETECTED NOT DETECTED Final   Stenotrophomonas maltophilia NOT DETECTED NOT DETECTED Final   Candida albicans NOT DETECTED NOT DETECTED Final   Candida auris NOT DETECTED NOT DETECTED Final   Candida glabrata NOT DETECTED NOT DETECTED Final   Candida krusei NOT DETECTED NOT DETECTED Final   Candida parapsilosis NOT DETECTED NOT DETECTED Final   Candida tropicalis NOT DETECTED NOT DETECTED Final   Cryptococcus neoformans/gattii NOT DETECTED NOT DETECTED Final   CTX-M ESBL NOT DETECTED NOT DETECTED Final   Carbapenem resistance IMP NOT DETECTED NOT DETECTED Final   Carbapenem resistance KPC NOT DETECTED NOT DETECTED Final   Carbapenem resistance NDM NOT DETECTED NOT DETECTED Final   Carbapenem resist OXA 48 LIKE NOT DETECTED NOT DETECTED Final   Carbapenem resistance VIM NOT DETECTED NOT DETECTED Final    Comment: Performed at [facility], [address] [place], [place] [address]  Culture, blood (Routine x 2)     Status: Abnormal   Collection Time: 07/14/21  1:30 PM   Specimen: BLOOD  Result Value Ref Range Status   Specimen Description   Final    BLOOD LEFT ANTECUBITAL Performed at [facility], [address] [place], [place] [address]    Special Requests   Final    BOTTLES DRAWN AEROBIC AND ANAEROBIC Blood Culture adequate volume Performed at [facility], [address]  [place], [place] [address]    Culture  Setup Time   Final    CORRECTED RESULTS GRAM NEGATIVE RODS PREVIOUSLY REPORTED AS: GRAM NEGATIVE COCCI CORRECTED RESULTS CALLED TO: PHARMD MORGAN H 1420 [id number] FCP IN BOTH AEROBIC AND ANAEROBIC BOTTLES    Culture (A)  Final    ESCHERICHIA COLI SUSCEPTIBILITIES PERFORMED ON PREVIOUS CULTURE WITHIN THE LAST 5 DAYS. Performed at [facility], [address] [place], [place] [address]  Report Status 07/17/2021 FINAL  Final  Surgical PCR screen     Status: Abnormal   Collection Time: 07/15/21  1:32 AM   Specimen: Nasal Mucosa; Nasal Swab  Result Value Ref Range Status   MRSA, PCR NEGATIVE NEGATIVE Final   Staphylococcus aureus POSITIVE (A) NEGATIVE Final    Comment: (NOTE) The Xpert SA Assay (FDA approved for NASAL specimens in patients 60 years of age and older), is one component of a comprehensive surveillance program. It is not intended to diagnose infection nor to guide or monitor treatment. Performed at [facility], [address] [place], [place] [address]   CULTURE, BLOOD (ROUTINE X 2) w Reflex to ID Panel     Status: None   Collection Time: 07/16/21 10:40 AM   Specimen: BLOOD  Result Value Ref Range Status   Specimen Description BLOOD BLOOD LEFT HAND  Final   Special Requests   Final    BOTTLES DRAWN AEROBIC AND ANAEROBIC Blood Culture results may not be optimal due to an inadequate volume of blood received in culture bottles   Culture   Final    NO GROWTH 5 DAYS Performed at [facility], [address] [place], [place] [address]    Report Status 07/21/2021 FINAL  Final  CULTURE, BLOOD (ROUTINE X 2) w Reflex to ID Panel     Status: None   Collection Time: 07/16/21 10:40 AM   Specimen: BLOOD  Result Value Ref Range Status   Specimen Description BLOOD BLOOD RIGHT HAND  Final   Special Requests   Final    BOTTLES DRAWN AEROBIC AND ANAEROBIC Blood Culture results may not be optimal due to an inadequate  volume of blood received in culture bottles   Culture   Final    NO GROWTH 5 DAYS Performed at [facility], [address] [place], [place] [address]    Report Status 07/21/2021 FINAL  Final  Resp Panel by RT-PCR (Flu A&B, Covid) Nasopharyngeal Swab     Status: None   Collection Time: 07/22/21 11:25 PM   Specimen: Nasopharyngeal Swab; Nasopharyngeal(NP) swabs in vial transport medium  Result Value Ref Range Status   SARS Coronavirus 2 by RT PCR NEGATIVE NEGATIVE Final    Comment: (NOTE) SARS-CoV-2 target nucleic acids are NOT DETECTED.  The SARS-CoV-2 RNA is generally detectable in upper respiratory specimens during the acute phase of infection. The lowest concentration of SARS-CoV-2 viral copies this assay can detect is 138 copies/mL. A negative result does not preclude SARS-Cov-2 infection and should not be used as the sole basis for treatment or other patient management decisions. A negative result may occur with  improper specimen collection/handling, submission of specimen other than nasopharyngeal swab, presence of viral mutation(s) within the areas targeted by this assay, and inadequate number of viral copies(<138 copies/mL). A negative result must be combined with clinical observations, patient history, and epidemiological information. The expected result is Negative.  Fact Sheet for Patients:  BloggerCourse.com  Fact Sheet for Healthcare Providers:  SeriousBroker.it  This test is no t yet approved or cleared by the [place] FDA and  has been authorized for detection and/or diagnosis of SARS-CoV-2 by FDA under an Emergency Use Authorization (EUA). This EUA will remain  in effect (meaning this test can be used) for the duration of the COVID-19 declaration under Section 564(b)(1) of the Act, 21 U.S.C.section 360bbb-3(b)(1), unless the authorization is terminated  or revoked sooner.       Influenza A by  PCR NEGATIVE NEGATIVE Final   Influenza B by PCR NEGATIVE NEGATIVE Final    Comment: (NOTE) The Xpert Xpress SARS-CoV-2/FLU/RSV plus assay is intended as an aid in the diagnosis of influenza from Nasopharyngeal swab specimens and should not be used as a sole basis for treatment. Nasal washings and aspirates are unacceptable for Xpert Xpress SARS-CoV-2/FLU/RSV testing.  Fact Sheet for Patients: EntrepreneurPulse.com.au  Fact Sheet for Healthcare Providers: IncredibleEmployment.be  This test is not yet approved or cleared by the [place] FDA and has been authorized for detection and/or diagnosis of SARS-CoV-2 by FDA under an Emergency Use Authorization (EUA). This EUA will remain in effect (meaning this test can be used) for the duration of the COVID-19 declaration under Section 564(b)(1) of the Act, 21 U.S.C. section 360bbb-3(b)(1), unless the authorization is terminated or revoked.  Performed at [facility], [address], [place], [place] [address]          Radiology Studies: CT ABDOMEN PELVIS W CONTRAST  Result Date: 07/22/2021 CLINICAL DATA:  Recent surgery. Abdominal pain. Radiologic records reports appendectomy 07/15/2021. EXAM: CT ABDOMEN AND PELVIS WITH CONTRAST TECHNIQUE: Multidetector CT imaging of the abdomen and pelvis was performed using the standard protocol following bolus administration of intravenous contrast. CONTRAST:  132mL OMNIPAQUE IOHEXOL 300 MG/ML  SOLN COMPARISON:  CT 07/14/2021 FINDINGS: Lower chest: No acute airspace disease or pleural effusion. Mild wall thickening of the distal esophagus. Hepatobiliary: No focal liver abnormality is seen. Mild heterogeneous hepatic perfusion. The gallbladder is partially distended. No calcified gallstone or pericholecystic inflammation. Pancreas: No ductal dilatation or inflammation. Spleen: Normal in size without focal abnormality. Adrenals/Urinary Tract: Normal adrenal  glands. No hydronephrosis or perinephric edema. Homogeneous renal enhancement with symmetric excretion on delayed phase imaging. No visualized renal stone or focal lesion. Slight lobulated bilateral renal contours. Urinary bladder is physiologically distended without wall thickening. Stomach/Bowel: Interval appendectomy. No abscess. Ingested material distends the stomach. Minimal distal esophageal wall thickening. No small bowel obstruction. Occasional fluid-filled loops of small bowel in the pelvis. Slight small bowel hyperemia but no definite wall thickening. No abnormal distension, ileus, or obstruction. Fluid/liquid stool in the ascending, transverse, and descending colon. No colonic inflammation or wall thickening. 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 Thrombus spans approximately 9.6 cm and extends from the mid abdomen to the portal splenic confluence. Mild adjacent peri venous stranding. There is no thrombus extension into the portal or splenic veins. The peripheral venous branches are patent. Multiple prominent central mesenteric lymph nodes with mild mesenteric edema. Aortic atherosclerosis. Reproductive: The uterus is deviated into the left pelvis, unchanged. Calcification in the right ovary. No suspicious adnexal mass. Other: Mesenteric edema with trace mesenteric free fluid. Trace perihepatic free fluid, non organized. No abscess or free air. There is no subcutaneous collection, expected postsurgical changes the abdominal wall. Musculoskeletal: Stable osseous structures. IMPRESSION: 1. 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 Mild adjacent peri venous stranding. There is no thrombus extension into the portal or splenic veins. 2. Interval appendectomy. No abscess. 3. Mild small bowel hyperemia. No obstruction or ileus. Liquid stool/fluid in the colon. 4. Mild distal esophageal wall thickening, can be  seen with reflux or esophagitis. 5. Mild heterogeneous hepatic perfusion, nonspecific. Aortic Atherosclerosis (ICD10-I70.0). These results were called by telephone at the time of interpretation on 07/22/2021 at 11:07 pm to provider [facility] , who verbally acknowledged these results. Electronically Signed   By: Aurther Loft.D.  On: 07/22/2021 23:08        Scheduled Meds:  dextrose  25 mL Intravenous Once   Continuous Infusions:  dextrose 5 % and 0.9 % NaCl with KCl 40 mEq/L 100 mL/hr at 07/23/21 0109   heparin 1,050 Units/hr (07/23/21 0850)     LOS: 0 days    Time spent: 48 min    Desma Maxim, MD [facility]   If 7PM-7AM, please contact night-coverage [email or web address] Password [email or web address] 07/23/2021, 9:09 AM

## 2021-07-23 NOTE — Evaluation (Signed)
Physical Therapy Evaluation Patient Details Name: Nicole Villegas MRN: [id number] DOB: 08-13-1965 Today's Date: 07/23/2021  History of Present Illness  Nicole Villegas is a 56 y.o. female with medical history significant for HTN, HLD, DM, GERD, depression, hospitalized from 12/28-12/31 with severe sepsis secondary to E. coli UTI, acute appendicitis and E. coli and Bacteroides bacteremia, s/p laparoscopic appendectomy on 12/28 and discharged on cefdinir for 10 days, who was brought to the ED by EMS for altered mental status.  Blood glucose with EMS was 33 and she received D50 becoming more alert.  Patient states she was feeling fine up until the day she came in though she does report some residual pain left flank which she has had since her surgery.  She denies nausea or vomiting.  Has had no cough or shortness of breath, fever or chills and no diarrhea or dysuria.  Son at bedside reports that since her surgery she has had decreased oral intake but was otherwise recovering as expected.  Clinical Impression  Pt is a pleasant 56 year old female who was admitted for uncontrolled DM. Pt demonstrates all bed mobility/transfers/ambulation at baseline level. Pt does not require any further PT needs at this time. Pt will be dc in house and does not require follow up. RN aware. Will dc current orders.        Recommendations for follow up therapy are one component of a multi-disciplinary discharge planning process, led by the attending physician.  Recommendations may be updated based on patient status, additional functional criteria and insurance authorization.  Follow Up Recommendations No PT follow up    Assistance Recommended at Discharge PRN  Patient can return home with the following       Equipment Recommendations None recommended by PT  Recommendations for Other Services       Functional Status Assessment Patient has not had a recent decline in their functional status     Precautions /  Restrictions Precautions Precautions: Fall Restrictions Weight Bearing Restrictions: No      Mobility  Bed Mobility Overal bed mobility: Modified Independent Bed Mobility: Supine to Sit;Sit to Supine     Supine to sit: Modified independent (Device/Increase time) Sit to supine: Modified independent (Device/Increase time)   General bed mobility comments: safe technique    Transfers Overall transfer level: Independent Equipment used: None Transfers: Sit to/from Stand Sit to Stand: Independent           General transfer comment: no AD, good control    Ambulation/Gait Ambulation/Gait assistance: Independent Gait Distance (Feet): 50 Feet Assistive device: None Gait Pattern/deviations: Step-through pattern       General Gait Details: multiple laps in room as pt requesting to stay in room. Pt able to perform turns safely and no SOB symptoms. No AD required  Stairs            Wheelchair Mobility    Modified Rankin (Stroke Patients Only)       Balance Overall balance assessment: Needs assistance Sitting-balance support: No upper extremity supported;Feet supported Sitting balance-Leahy Scale: Good     Standing balance support: No upper extremity supported;During functional activity Standing balance-Leahy Scale: Good                               Pertinent Vitals/Pain Pain Assessment: No/denies pain    Home Living Family/patient expects to be discharged to:: Private residence Living Arrangements: Children;Spouse/significant other Available Help at Discharge: Family;Available 24 hours/day  Type of Home: House Home Access: Stairs to enter Entrance Stairs-Rails: None Entrance Stairs-Number of Steps: 2   Home Layout: One level Home Equipment: Grab bars - tub/shower      Prior Function Prior Level of Function : Independent/Modified Independent             Mobility Comments: Independent w/ all mobility and ADLs       Hand  Dominance        Extremity/Trunk Assessment   Upper Extremity Assessment Upper Extremity Assessment: Overall WFL for tasks assessed    Lower Extremity Assessment Lower Extremity Assessment: Overall WFL for tasks assessed       Communication   Communication: Prefers language other than English (use of intpreter [name])  Cognition Arousal/Alertness: Awake/alert Behavior During Therapy: WFL for tasks assessed/performed Overall Cognitive Status: Within Functional Limits for tasks assessed                                          General Comments      Exercises     Assessment/Plan    PT Assessment Patient does not need any further PT services  PT Problem List Decreased mobility       PT Treatment Interventions      PT Goals (Current goals can be found in the Care Plan section)  Acute Rehab PT Goals Patient Stated Goal: to go home PT Goal Formulation: All assessment and education complete, DC therapy Time For Goal Achievement: 07/23/21 Potential to Achieve Goals: Good    Frequency       Co-evaluation               AM-PAC PT "6 Clicks" Mobility  Outcome Measure Help needed turning from your back to your side while in a flat bed without using bedrails?: None Help needed moving from lying on your back to sitting on the side of a flat bed without using bedrails?: None Help needed moving to and from a bed to a chair (including a wheelchair)?: None Help needed standing up from a chair using your arms (e.g., wheelchair or bedside chair)?: None Help needed to walk in hospital room?: None Help needed climbing 3-5 steps with a railing? : None 6 Click Score: 24    End of Session   Activity Tolerance: Patient tolerated treatment well Patient left: in bed;with family/visitor present Nurse Communication: Mobility status PT Visit Diagnosis: Difficulty in walking, not elsewhere classified (R26.2)    Time: [id number] PT Time Calculation (min)  (ACUTE ONLY): 13 min   Charges:   PT Evaluation $PT Eval Low Complexity: 1 Low          Greggory Stallion, PT, DPT [phone]   [name] 07/23/2021, 2:44 PM

## 2021-07-23 NOTE — Consult Note (Signed)
ANTICOAGULATION CONSULT NOTE - Initial Consult  Pharmacy Consult for heparin infusion Indication: DVT -- mesenteric vein thrombus  No Known Allergies  Patient Measurements:   Heparin Dosing Weight: 61.4 kg  Vital Signs: BP: 141/80 (01/05 0730) Pulse Rate: 98 (01/05 0730)  Labs: Recent Labs    07/22/21 2112 07/22/21 2326 07/23/21 0633  HGB 9.9*  --   --   HCT 30.1*  --   --   PLT 410*  --   --   APTT  --  40*  --   LABPROT  --  14.3  --   INR  --  1.1  --   HEPARINUNFRC  --   --  0.35  CREATININE 0.68  --   --      Estimated Creatinine Clearance: 68.6 mL/min (by C-G formula based on SCr of 0.68 mg/dL).   Medical History: Past Medical History:  Diagnosis Date   Diabetes mellitus without complication (HCC)    Hyperlipidemia    Hypertension     Medications:  No prior anticoagulation noted   Assessment: 56 y.o female arrived via EMS for hypoglycemia. 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   Goal of Therapy:  Heparin level 0.3-0.7 units/ml Monitor platelets by anticoagulation protocol: Yes   Plan:  1/5@0633 : HL 0.35, thera x 1 Continue heparin infusion at 1050 units/hr Check confirmatory HL in 6 hours Continue to monitor H&H and platelets  Bettey Costa, PharmD Clinical Pharmacist   07/23/2021,8:01 AM

## 2021-07-23 NOTE — H&P (Signed)
History and Physical    Nicole Villegas [id number] DOB: January 06, 1966 DOA: 07/22/2021  PCP: Marya Fossa, PA-C   Patient coming from: home  I have personally briefly reviewed patient's relevant medical records in [facility]  Chief Complaint: altered mental status  HPI: Nicole Villegas is a 56 y.o. female with medical history significant for HTN, HLD, DM, GERD, depression, hospitalized from 12/28-12/31 with severe sepsis secondary to E. coli UTI, acute appendicitis and E. coli and Bacteroides bacteremia, s/p laparoscopic appendectomy on 12/28 and discharged on cefdinir for 10 days, who was brought to the ED by EMS for altered mental status.  Blood glucose with EMS was 33 and she received D50 becoming more alert.  Patient states she was feeling fine up until the day she came in though she does report some residual pain left flank which she has had since her surgery.  She denies nausea or vomiting.  Has had no cough or shortness of breath, fever or chills and no diarrhea or dysuria.  Son at bedside reports that since her surgery she has had decreased oral intake but was otherwise recovering as expected.  ED course: On arrival, vitals within normal limits but blood sugar 39 requiring another amp of D50 Labs: WBC 12.3, up from 7.4 on 12/31: Hemoglobin 9.9, down from 11.4 on 12/31 Potassium 3.2 Urinalysis unremarkable  EKG not done  CT abdomen and pelvis: Occlusive thrombus superior mesenteric vein spanning approximately 9.6 cm new from preoperative exam Interval appendectomy with no abscess.  Please see full radiographic report below for details  The ED provider spoke with vascular surgeon, Dr. Marena Chancy who recommended heparin infusion for few days.  Hospitalist consulted for admission.   Review of Systems: As per HPI otherwise all other systems on review of systems negative.   Assessment/Plan    Mesenteric vein thrombosis (HCC) -Continue heparin infusion started in the ED - Vascular  consult to evaluate for need for thrombectomy, Dr. Marena Chancy aware - We will keep n.p.o. in case of procedure    Acute metabolic encephalopathy - Secondary to hypoglycemia given improvement with D50 by EMS - Differential includes residual infection given recent bacteremia and UTI - Infection work-up given leukocytosis, follow CXR and blood cultures - Treat  hypoglycemia- - Neurologic checks, aspiration precautions and keep n.p.o. for now    Hypoglycemia due to type 2 diabetes mellitus (HCC) - Patient on multiple hypoglycemic agents at home including glipizide, Levemir, Ozempic, Janumet - Hold oral hypoglycemic agents - IV hydration with D5 NS - Fingersticks every hour and if still low will start D10 - Slow restart hypoglycemics when hypoglycemia has resolved    History of bacteremia 07/15/21 - Bacteroides and E. coli bacteremia on 12/28 treated with Rocephin and subsequently Ceftin - Follow blood cultures    Hypertension - IV hydralazine while n.p.o.    S/P laparoscopic appendectomy - CT abdomen and pelvis with no apparent acute surgical complication - Patient followed with surgery postop on 1/4.    DVT prophylaxis: Heparin infusion  code Status: full code  Family Communication:  son at bedside  Disposition Plan: Back to previous home environment Consults called: Vascular Status:At the time of admission, it appears that the appropriate admission status for this patient is INPATIENT. This is judged to be reasonable and necessary in order to provide the required intensity of service to ensure the patient's safety given the presenting symptoms, physical exam findings, and initial radiographic and laboratory data in the context of their  Comorbid conditions.  Patient requires inpatient status due to high intensity of service, high risk for further deterioration and high frequency of surveillance required.   I certify that at the point of admission it is my clinical judgment that the  patient will require inpatient hospital care spanning beyond 2 midnights  Physical Exam: Vitals:   07/22/21 1934 07/22/21 2136  BP: 122/78 (!) 149/87  Pulse: 85 85  Resp: 20 20  Temp: 97.6 F (36.4 C)   TempSrc: Oral   SpO2: 100% 100%   Constitutional: Alert, oriented x 3 . Not in any apparent distress HEENT:      Head: Normocephalic and atraumatic.         Eyes: PERLA, EOMI, Conjunctivae are normal. Sclera is non-icteric.       Mouth/Throat: Mucous membranes are moist.       Neck: Supple with no signs of meningismus. Cardiovascular: Regular rate and rhythm. No murmurs, gallops, or rubs. 2+ symmetrical distal pulses are present . No JVD. No  LE edema Respiratory: Respiratory effort normal .Lungs sounds clear bilaterally. No wheezes, crackles, or rhonchi.  Gastrointestinal: Soft, non tender, non distended. Positive bowel sounds.  Genitourinary: No CVA tenderness. Musculoskeletal: Nontender with normal range of motion in all extremities. No cyanosis, or erythema of extremities. Neurologic:  Face is symmetric. Moving all extremities. No gross focal neurologic deficits . Skin: Skin is warm, dry.  No rash or ulcers Psychiatric: Mood and affect are appropriate     Past Medical History:  Diagnosis Date   Diabetes mellitus without complication (HCC)    Hyperlipidemia    Hypertension     Past Surgical History:  Procedure Laterality Date   APPENDECTOMY     CESAREAN SECTION     x3   XI ROBOTIC LAPAROSCOPIC ASSISTED APPENDECTOMY N/A 07/15/2021   Procedure: XI ROBOTIC LAPAROSCOPIC ASSISTED APPENDECTOMY;  Surgeon: Sung AmabileSakai, Isami, DO;  Location: [facility] ORS;  Service: General;  Laterality: N/A;     reports that she has never smoked. She has never used smokeless tobacco. She reports that she does not currently use alcohol. She reports that she does not currently use drugs.  No Known Allergies  Family History  Problem Relation Age of Onset   Diabetes Mellitus I Maternal Aunt     Hypertension Maternal Aunt       Prior to Admission medications   Medication Sig Start Date End Date Taking? Authorizing Provider  acetaminophen (TYLENOL) 325 MG tablet Take 2 tablets (650 mg total) by mouth every 8 (eight) hours as needed for mild pain. 07/17/21 08/16/21  Tonna BoehringerSakai, Isami, DO  amLODipine (NORVASC) 2.5 MG tablet Take 2.5 mg by mouth daily. 06/17/21   [provider]  atenolol (TENORMIN) 50 MG tablet Take 50 mg by mouth daily.    [provider]  atorvastatin (LIPITOR) 80 MG tablet Take 1 tablet (80 mg total) by mouth daily. Patient not taking: Reported on 07/14/2021 01/31/20   Kathlen ModyAkula, Vijaya, MD  cefdinir (OMNICEF) 300 MG capsule Take 1 capsule (300 mg total) by mouth 2 (two) times daily for 10 days. 07/18/21 07/28/21  Charise KillianWilliams, Jamiese M, MD  docusate sodium (COLACE) 100 MG capsule Take 1 capsule (100 mg total) by mouth 2 (two) times daily as needed for up to 10 days for mild constipation. 07/17/21 07/27/21  Tonna BoehringerSakai, Isami, DO  fexofenadine (ALLEGRA) 60 MG tablet Take 60 mg by mouth 2 (two) times daily as needed for allergies.     [provider]  fluticasone (FLONASE) 50 MCG/ACT nasal spray  Place 2 sprays into both nostrils daily as needed for allergies.     [provider]  gabapentin (NEURONTIN) 300 MG capsule Take 300 mg by mouth 2 (two) times daily.    [provider]  GLIPIZIDE XL 10 MG 24 hr tablet Take 10 mg by mouth 2 (two) times daily.    [provider]  HYDROcodone-acetaminophen (NORCO) 5-325 MG tablet Take 1 tablet by mouth every 6 (six) hours as needed for up to 5 days for severe pain. 07/18/21 07/23/21  Charise KillianWilliams, Jamiese M, MD  ibuprofen (ADVIL) 800 MG tablet Take 1 tablet (800 mg total) by mouth every 8 (eight) hours as needed for mild pain or moderate pain. 07/17/21   Tonna BoehringerSakai, Isami, DO  insulin aspart (NOVOLOG) 100 UNIT/ML FlexPen Inject 50 Units into the skin See admin instructions. Inject 39 units twice a day before lunch  and dinner.    [provider]  insulin detemir (LEVEMIR) 100 UNIT/ML injection Inject 60 Units into the skin at bedtime.    [provider]  lisinopril-hydrochlorothiazide (PRINZIDE,ZESTORETIC) 20-12.5 MG tablet Take 2 tablets by mouth daily.     [provider]  metroNIDAZOLE (FLAGYL) 500 MG tablet Take 1 tablet (500 mg total) by mouth 3 (three) times daily for 10 days. 07/18/21 07/28/21  Charise KillianWilliams, Jamiese M, MD  nortriptyline (PAMELOR) 10 MG capsule Take 2 capsules by mouth at bedtime. 06/18/21   [provider]  OZEMPIC, 0.25 OR 0.5 MG/DOSE, 2 MG/1.5ML SOPN Inject into the skin. 04/02/21   [provider]  pantoprazole (PROTONIX) 40 MG tablet Take 1 tablet (40 mg total) by mouth daily. 01/31/20 03/31/20  Kathlen ModyAkula, Vijaya, MD  sitaGLIPtin-metformin (JANUMET) 50-1000 MG tablet Take 1 tablet by mouth 2 (two) times daily with a meal.    [provider]      Labs on Admission: I have personally reviewed following labs and imaging studies  CBC: Recent Labs  Lab 07/16/21 0450 07/17/21 0530 07/18/21 0538 07/22/21 2112  WBC 7.2 6.4 7.4 12.3*  NEUTROABS  --   --   --  10.5*  HGB 10.9* 12.4 11.4* 9.9*  HCT 31.5* 36.4 33.5* 30.1*  MCV 83.6 83.3 82.5 86.5  PLT 124* 141* 153 410*   Basic Metabolic Panel: Recent Labs  Lab 07/16/21 0450 07/17/21 0530 07/18/21 0538 07/22/21 2112  NA 136 132* 133* 132*  K 3.8 3.5 3.6 3.2*  CL 105 99 100 100  CO2 22 23 27 22   GLUCOSE 204* 186* 210* 219*  BUN 17 12 13 19   CREATININE 0.76 0.51 0.71 0.68  CALCIUM 8.3* 8.4* 8.3* 8.2*   GFR: Estimated Creatinine Clearance: 68.6 mL/min (by C-G formula based on SCr of 0.68 mg/dL). Liver Function Tests: No results for input(s): AST, ALT, ALKPHOS, BILITOT, PROT, ALBUMIN in the last 168 hours. No results for input(s): LIPASE, AMYLASE in the last 168 hours. No results for input(s): AMMONIA in the last 168 hours. Coagulation Profile: Recent Labs  Lab  07/22/21 2326  INR 1.1   Cardiac Enzymes: No results for input(s): CKTOTAL, CKMB, CKMBINDEX, TROPONINI in the last 168 hours. BNP (last 3 results) No results for input(s): PROBNP in the last 8760 hours. HbA1C: No results for input(s): HGBA1C in the last 72 hours. CBG: Recent Labs  Lab 07/18/21 1142 07/22/21 1931 07/22/21 2045 07/22/21 2135 07/22/21 2325  GLUCAP 339* 72 39* 117* 107*   Lipid Profile: No results for input(s): CHOL, HDL, LDLCALC, TRIG, CHOLHDL, LDLDIRECT in the last 72 hours. Thyroid  Function Tests: No results for input(s): TSH, T4TOTAL, FREET4, T3FREE, THYROIDAB in the last 72 hours. Anemia Panel: No results for input(s): VITAMINB12, FOLATE, FERRITIN, TIBC, IRON, RETICCTPCT in the last 72 hours. Urine analysis:    Component Value Date/Time   COLORURINE YELLOW 07/22/2021 2325   APPEARANCEUR CLEAR 07/22/2021 2325   APPEARANCEUR Clear 05/05/2018 1336   LABSPEC <1.005 (L) 07/22/2021 2325   PHURINE 5.0 07/22/2021 2325   GLUCOSEU 100 (A) 07/22/2021 2325   HGBUR NEGATIVE 07/22/2021 2325   BILIRUBINUR NEGATIVE 07/22/2021 2325   BILIRUBINUR Negative 05/05/2018 1336   KETONESUR NEGATIVE 07/22/2021 2325   PROTEINUR NEGATIVE 07/22/2021 2325   NITRITE NEGATIVE 07/22/2021 2325   LEUKOCYTESUR NEGATIVE 07/22/2021 2325    Radiological Exams on Admission: CT ABDOMEN PELVIS W CONTRAST  Result Date: 07/22/2021 CLINICAL DATA:  Recent surgery. Abdominal pain. Radiologic records reports appendectomy 07/15/2021. EXAM: CT ABDOMEN AND PELVIS WITH CONTRAST TECHNIQUE: Multidetector CT imaging of the abdomen and pelvis was performed using the standard protocol following bolus administration of intravenous contrast. CONTRAST:  OMNIPAQUE IOHEXOL 300 MG/ML  SOLN COMPARISON:  CT 07/14/2021 FINDINGS: Lower chest: No acute airspace disease or pleural effusion. Mild wall thickening of the distal esophagus. Hepatobiliary: No focal liver abnormality is seen. Mild heterogeneous hepatic  perfusion. The gallbladder is partially distended. No calcified gallstone or pericholecystic inflammation. Pancreas: No ductal dilatation or inflammation. Spleen: Normal in size without focal abnormality. Adrenals/Urinary Tract: Normal adrenal glands. No hydronephrosis or perinephric edema. Homogeneous renal enhancement with symmetric excretion on delayed phase imaging. No visualized renal stone or focal lesion. Slight lobulated bilateral renal contours. Urinary bladder is physiologically distended without wall thickening. Stomach/Bowel: Interval appendectomy. No abscess. Ingested material distends the stomach. Minimal distal esophageal wall thickening. No small bowel obstruction. Occasional fluid-filled loops of small bowel in the pelvis. Slight small bowel hyperemia but no definite wall thickening. No abnormal distension, ileus, or obstruction. Fluid/liquid stool in the ascending, transverse, and descending colon. No colonic inflammation or wall thickening. 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 Thrombus spans approximately 9.6 cm and extends from the mid abdomen to the portal splenic confluence. Mild adjacent peri venous stranding. There is no thrombus extension into the portal or splenic veins. The peripheral venous branches are patent. Multiple prominent central mesenteric lymph nodes with mild mesenteric edema. Aortic atherosclerosis. Reproductive: The uterus is deviated into the left pelvis, unchanged. Calcification in the right ovary. No suspicious adnexal mass. Other: Mesenteric edema with trace mesenteric free fluid. Trace perihepatic free fluid, non organized. No abscess or free air. There is no subcutaneous collection, expected postsurgical changes the abdominal wall. Musculoskeletal: Stable osseous structures. IMPRESSION: 1. 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 Mild adjacent peri venous stranding.  There is no thrombus extension into the portal or splenic veins. 2. Interval appendectomy. No abscess. 3. Mild small bowel hyperemia. No obstruction or ileus. Liquid stool/fluid in the colon. 4. Mild distal esophageal wall thickening, can be seen with reflux or esophagitis. 5. Mild heterogeneous hepatic perfusion, nonspecific. Aortic Atherosclerosis (ICD10-I70.0). These results were called by telephone at the time of interpretation on 07/22/2021 at 11:07 pm to provider [facility] , who verbally acknowledged these results. Electronically Signed   By: Narda Rutherford M.D.   On: 07/22/2021 23:08       Andris Baumann MD [facility]   07/23/2021, 12:26 AM

## 2021-07-23 NOTE — ED Notes (Signed)
Resting in bed. Resp even, unlabored on RA. Denies pain. Heparin gtt infusing at 1050 units/hr. D5NS with 40 Kcl at 50 ml/hr. No distress noted at this time.

## 2021-07-23 NOTE — Consult Note (Signed)
ANTICOAGULATION CONSULT NOTE  Pharmacy Consult for heparin infusion Indication: DVT -- mesenteric vein thrombus  No Known Allergies  Patient Measurements:   Heparin Dosing Weight: 61.4 kg  Vital Signs: BP: 132/70 (01/05 1030) Pulse Rate: 93 (01/05 1030)  Labs: Recent Labs    07/22/21 2112 07/22/21 2326 07/23/21 0633 07/23/21 1348  HGB 9.9*  --  9.4*  --   HCT 30.1*  --  29.1*  --   PLT 410*  --  538*  --   APTT  --  40*  --   --   LABPROT  --  14.3  --   --   INR  --  1.1  --   --   HEPARINUNFRC  --   --  0.35 0.33  CREATININE 0.68  --  0.65  --      Estimated Creatinine Clearance: 68.6 mL/min (by C-G formula based on SCr of 0.65 mg/dL).   Medical History: Past Medical History:  Diagnosis Date   Diabetes mellitus without complication ([place])    Hyperlipidemia    Hypertension     Medications:  No prior anticoagulation noted   Assessment: 56 y.o female arrived via EMS for hypoglycemia. 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   Goal of Therapy:  Heparin level 0.3-0.7 units/ml Monitor platelets by anticoagulation protocol: Yes   Plan:  Heparin level therapeutic x 1 Continue heparin infusion at 1050 units/hr Check HL with morning labs Continue to monitor H&H and platelets  Tawnya Crook, PharmD Clinical Pharmacist   07/23/2021,3:08 PM

## 2021-07-23 NOTE — Progress Notes (Addendum)
Inpatient Diabetes Program Recommendations  AACE/ADA: New Consensus Statement on Inpatient Glycemic Control (2015)  Target Ranges:  Prepandial:   less than 140 mg/dL      Peak postprandial:   less than 180 mg/dL (1-2 hours)      Critically ill patients:  140 - 180 mg/dL   Lab Results  Component Value Date   GLUCAP 164 (H) 07/23/2021   HGBA1C 12.8 (H) 01/31/2020    Review of Glycemic Control  Diabetes history: DM2 Outpatient Diabetes medications: Levemir 60 units QHS, Novolog 50 units with lunch and breakfast, Glipizide 10 mg BID, Ozempic, Janumet  Current orders for Inpatient glycemic control: Novolog 0-15 units TID and 0-5 units QHS  May consider decreasing Novolog meal coverage at home and Glipizide to avoid hypoglycemia.  S/P appendectomy and not eating well.  Spoke with patient and son at bedside.  They confirmed above home DM medications.  Denies skipping doses and difficulty obtaining medications.  She believes she was hypoglycemic at home due to poor PO intake post surgery.  She denies hypoglycemia normally.  She ran out of strips and has not been checking her CBGs.  She is current with her PCP and last saw her 1 month ago.  MD, please order strips at DC.  Order # [id number].  She has BC/BS and would be interested in a [employer] at DC.    Addendum@1423 :  CBG 272 mg/dL at noon.  Might consider: Semglee 30 units QHS (50% of home basal)   Will continue to follow while inpatient.  Thank you, Dulce Sellar, MSN, RN Diabetes Coordinator Inpatient Diabetes Program [phone] (team pager from 8a-5p)

## 2021-07-23 NOTE — Progress Notes (Signed)
Video Interpreter services used for assessment, admission profile, education, review of POC, and addressing of questions by pt and family  Interpreter IDIgnacia Bayley # [phone]

## 2021-07-24 ENCOUNTER — Encounter: Payer: Self-pay | Admitting: Obstetrics and Gynecology

## 2021-07-24 ENCOUNTER — Inpatient Hospital Stay: Payer: BC Managed Care – PPO

## 2021-07-24 LAB — BASIC METABOLIC PANEL
Anion gap: 6 (ref 5–15)
BUN: 11 mg/dL (ref 6–20)
CO2: 24 mmol/L (ref 22–32)
Calcium: 8.4 mg/dL — ABNORMAL LOW (ref 8.9–10.3)
Chloride: 107 mmol/L (ref 98–111)
Creatinine, Ser: 0.54 mg/dL (ref 0.44–1.00)
GFR, Estimated: >60 mL/min (ref >60)
Glucose, Bld: 239 mg/dL — ABNORMAL HIGH (ref 70–99)
Potassium: 4.8 mmol/L (ref 3.5–5.1)
Sodium: 137 mmol/L (ref 135–145)

## 2021-07-24 LAB — CBC
HCT: 29.8 % — ABNORMAL LOW (ref 36.0–46.0)
Hemoglobin: 9.7 g/dL — ABNORMAL LOW (ref 12.0–15.0)
MCH: 27.4 pg (ref 26.0–34.0)
MCHC: 32.6 g/dL (ref 30.0–36.0)
MCV: 84.2 fL (ref 80.0–100.0)
Platelets: 548 10*3/uL — ABNORMAL HIGH (ref 150–400)
RBC: 3.54 MIL/uL — ABNORMAL LOW (ref 3.87–5.11)
RDW: 13.2 % (ref 11.5–15.5)
WBC: 9.6 10*3/uL (ref 4.0–10.5)
nRBC: 0 % (ref 0.0–0.2)

## 2021-07-24 LAB — GLUCOSE, CAPILLARY
Glucose-Capillary: 271 mg/dL — ABNORMAL HIGH (ref 70–99)
Glucose-Capillary: 222 mg/dL — ABNORMAL HIGH (ref 70–99)
Glucose-Capillary: 207 mg/dL — ABNORMAL HIGH (ref 70–99)
Glucose-Capillary: 243 mg/dL — ABNORMAL HIGH (ref 70–99)
Glucose-Capillary: 195 mg/dL — ABNORMAL HIGH (ref 70–99)
Glucose-Capillary: 229 mg/dL — ABNORMAL HIGH (ref 70–99)

## 2021-07-24 LAB — LIPID PANEL
Cholesterol: 76 mg/dL (ref 0–200)
HDL: 29 mg/dL — ABNORMAL LOW (ref >40)
LDL Cholesterol: 28 mg/dL (ref 0–99)
Total CHOL/HDL Ratio: 2.6 RATIO
Triglycerides: 97 mg/dL (ref <150)
VLDL: 19 mg/dL (ref 0–40)

## 2021-07-24 LAB — LACTIC ACID, PLASMA: Lactic Acid, Venous: 1.2 mmol/L (ref 0.5–1.9)

## 2021-07-24 LAB — HEPARIN LEVEL (UNFRACTIONATED): Heparin Unfractionated: 0.36 IU/mL (ref 0.30–0.70)

## 2021-07-24 MED ORDER — ASPIRIN EC 325 MG PO TBEC: 325.0000 mg | DELAYED_RELEASE_TABLET | Freq: Every day | ORAL | Status: DC

## 2021-07-24 MED ORDER — APIXABAN 5 MG PO TABS: 5.0000 mg | ORAL_TABLET | Freq: Two times a day (BID) | ORAL | Status: DC

## 2021-07-24 MED ORDER — APIXABAN 5 MG PO TABS
10.0000 mg | ORAL_TABLET | Freq: Two times a day (BID) | ORAL | Status: DC
  Administered 2021-07-24 – 2021-07-26 (×5): 10 mg via ORAL
  Filled 2021-07-24 (×5): qty 2

## 2021-07-24 MED ORDER — ATORVASTATIN CALCIUM 20 MG PO TABS
80.0000 mg | ORAL_TABLET | Freq: Every day | ORAL | Status: DC
  Administered 2021-07-24 – 2021-07-25 (×2): 80 mg via ORAL
  Filled 2021-07-24 (×2): qty 4

## 2021-07-24 MED ORDER — ATORVASTATIN CALCIUM 20 MG PO TABS: 40.0000 mg | ORAL_TABLET | Freq: Every day | ORAL | Status: DC

## 2021-07-24 MED ORDER — IOHEXOL 350 MG/ML SOLN
75.0000 mL | Freq: Once | INTRAVENOUS | Status: AC | PRN
  Administered 2021-07-24: 21:00:00 75 mL via INTRAVENOUS

## 2021-07-24 MED ORDER — INSULIN ASPART 100 UNIT/ML IJ SOLN
3.0000 [IU] | Freq: Three times a day (TID) | INTRAMUSCULAR | Status: DC
  Administered 2021-07-24 – 2021-07-26 (×5): 3 [IU] via SUBCUTANEOUS
  Filled 2021-07-24 (×4): qty 1

## 2021-07-24 NOTE — Consult Note (Signed)
ANTICOAGULATION CONSULT NOTE  Pharmacy Consult for transition of heparin to apixaban Indication: DVT -- mesenteric vein thrombus  No Known Allergies  Patient Measurements: Height: 5\' 1"  (154.9 cm) Weight: 65.8 kg (145 lb 1 oz) IBW/kg (Calculated) : 47.8 Heparin Dosing Weight: 61.4 kg  Vital Signs: Temp: 98.5 F (36.9 C) (01/06 1157) Temp Source: Oral (01/06 1157) BP: 131/80 (01/06 1157) Pulse Rate: 91 (01/06 1157)  Labs: Recent Labs    07/22/21 2112 07/22/21 2326 07/23/21 0633 07/23/21 1348 07/24/21 0434  HGB 9.9*  --  9.4*  --  9.7*  HCT 30.1*  --  29.1*  --  29.8*  PLT 410*  --  538*  --  548*  APTT  --  40*  --   --   --   LABPROT  --  14.3  --   --   --   INR  --  1.1  --   --   --   HEPARINUNFRC  --   --  0.35 0.33 0.36  CREATININE 0.68  --  0.65  --  0.54     Estimated Creatinine Clearance: 69 mL/min (by C-G formula based on SCr of 0.54 mg/dL).   Medical History: Past Medical History:  Diagnosis Date   Diabetes mellitus without complication (HCC)    Hyperlipidemia    Hypertension     Medications:  No prior anticoagulation noted   Assessment: 56 y.o female arrived via EMS for hypoglycemia. 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 The patient is being transitioned from a heparin infusion to apixaban  Goal of Therapy:  Monitor platelets by anticoagulation protocol: Yes   Plan:  Stop heparin infusion Start apixaban 10 mg twice daily for 7 days followed by 5 mg twice daily Continue to monitor H&H and platelets  53, PharmD Clinical Pharmacist   07/24/2021,2:42 PM

## 2021-07-24 NOTE — Plan of Care (Signed)
°  Problem: Education: Goal: Knowledge of General Education information will improve Description: Including pain rating scale, medication(s)/side effects and non-pharmacologic comfort measures Outcome: Progressing   Problem: Health Behavior/Discharge Planning: Goal: Ability to manage health-related needs will improve Outcome: Progressing   Problem: Clinical Measurements: Goal: Ability to maintain clinical measurements within normal limits will improve Outcome: Progressing Goal: Will remain free from infection Outcome: Progressing Goal: Diagnostic test results will improve Outcome: Progressing Goal: Respiratory complications will improve Outcome: Progressing Goal: Cardiovascular complication will be avoided Outcome: Progressing   Problem: Activity: Goal: Risk for activity intolerance will decrease Outcome: Progressing   Problem: Nutrition: Goal: Adequate nutrition will be maintained Outcome: Progressing   Problem: Coping: Goal: Level of anxiety will decrease Outcome: Progressing   Problem: Elimination: Goal: Will not experience complications related to bowel motility Outcome: Progressing Goal: Will not experience complications related to urinary retention Outcome: Progressing   Problem: Pain Managment: Goal: General experience of comfort will improve Outcome: Progressing   Problem: Safety: Goal: Ability to remain free from injury will improve Outcome: Progressing   Problem: Skin Integrity: Goal: Risk for impaired skin integrity will decrease Outcome: Progressing   Problem: Education: Goal: Knowledge of disease or condition will improve Outcome: Progressing Goal: Knowledge of patient specific risk factors will improve (INDIVIDUALIZE FOR PATIENT) Outcome: Progressing Note: Stroke book (Spanish) given to family in order for them to see why we are doing the current radiology tests (MRI and CT Scan) based upon her symptoms of slurred speech at present   Problem:  Coping: Goal: Will verbalize positive feelings about self Outcome: Progressing Goal: Will identify appropriate support needs Outcome: Progressing   Problem: Health Behavior/Discharge Planning: Goal: Ability to manage health-related needs will improve Outcome: Progressing   Problem: Self-Care: Goal: Ability to participate in self-care as condition permits will improve Outcome: Progressing Goal: Verbalization of feelings and concerns over difficulty with self-care will improve Outcome: Progressing Goal: Ability to communicate needs accurately will improve Outcome: Progressing   Problem: Nutrition: Goal: Risk of aspiration will decrease Outcome: Progressing   Problem: Ischemic Stroke/TIA Tissue Perfusion: Goal: Complications of ischemic stroke/TIA will be minimized Outcome: Progressing

## 2021-07-24 NOTE — Progress Notes (Signed)
Inpatient Diabetes Program Recommendations  AACE/ADA: New Consensus Statement on Inpatient Glycemic Control (2015)  Target Ranges:  Prepandial:   less than 140 mg/dL      Peak postprandial:   less than 180 mg/dL (1-2 hours)      Critically ill patients:  140 - 180 mg/dL   Lab Results  Component Value Date   GLUCAP 222 (H) 07/24/2021   HGBA1C 12.8 (H) 01/31/2020    Review of Glycemic Control  Diabetes history: DM2 Outpatient Diabetes medications: Levemir 60 units QHS, Novolog 50 units with lunch and breakfast, Glipizide 10 mg BID, Ozempic, Janumet  Current orders for Inpatient glycemic control: Semglee 30 units qd, Novolog 0-15 units TID and 0-5 units QHS  Inpatient Diabetes Program Recommendations:   Please consider: -Novolog 3 units tid meal coverage if eats 50% Please consider ordering @ discharge -Libre 2 sensor order # [id number]  Thank you, Nani Gasser. Akima Slaugh, RN, MSN, CDE  Diabetes Coordinator Inpatient Glycemic Control Team Team Pager [phone] (8am-5pm) 07/24/2021 10:12 AM

## 2021-07-24 NOTE — Consult Note (Signed)
[facility] TeleNeurology Consult Services  Stat Consult  Patient Name:   Nicole, Villegas Date of Birth:   14-Aug-1965 Identification Number:   MRN - [id number] Date of Service:   07/24/2021 21:37:10  Diagnosis:       I63.9 - Cerebrovascular accident (CVA), unspecified mechanism ([place])  Impression 56 year old female with slurred speech and subtle left facial droop who has small right MCA territory stroke. Imaging shows subtle petechial blood within the stroke and given that patient needs to be on Eliquis for mesenteric thrombus would suggest close watch for changes in neurological status with a low threshold for a STAT CT brain. Symptoms started approximately 10 days ago so she should be ok to continue Eliquis with close monitoring.  Stroke etiology may be artery to artery emobli from right ICA occlusion or hypoperfusion event.  CT HEAD: Reviewed  Our recommendations are outlined below.  Diagnostic Studies: Repeat CT Head without contrast at 24 hours  Laboratory Studies: Recommend Lipid panel Hemoglobin A1c  Medications: Statins for LDL goal less than 70  Nursing Recommendations: Telemetry, IV Fluids, avoid dextrose containing fluids, Maintain euglycemia Neuro checks q4 hrs x 24 hrs and then per shift  Consultations: Recommend Speech therapy if failed dysphagia screen Physical therapy/Occupational therapy  DVT Prophylaxis: Choice of Primary Team  Disposition: Neurology will follow  Diagnostic Studies Free Text: STAT CT brain if there is any acute change in mental status or sudden severe headache.  Free Text Medications: continue Eliquis with close monitoring Avoid hypotension   Imaging MRA head/neck: IMPRESSION: 1. Complete occlusion of the right internal carotid artery. This could be more completely assessed with CTA of the neck. 2. Irregularity of the left ICA may be secondary to atherosclerosis. 3. Moderate stenosis of the proximal right  ACA A2 segment.   MRI Head: IMPRESSION: 1. Multifocal acute ischemia in the right MCA territory, predominantly cortical/subcortical. Small amount of associated petechial blood. 2. 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    Metrics: [facility] Notification Time: 07/24/2021 21:33:55 Stamp Time: 07/24/2021 21:37:10 Callback Response Time: 07/24/2021 21:37:47   ----------------------------------------------------------------------------------------------------  Chief Complaint: slurred speech  History of Present Illness: Patient is a 56 year old Female. 56 year old female who is admitted to the hospital because of abdominal pain and is s/p appendectomy complicated by a mesenteric thrombosis now on Eliquis. Family noticed slurred speech on 12/27. Patient had an appendectomy on 12/28 and was discharged home. On 1/4 family called 911 again because patient became unresponsive because of low blood sugar and at that time they noticed left facial droop. On imaging today MRI showed small right MCA territory cortical infarcts as well as a right ICA occlusion. Per son patient's slurred speech seems to fluctuate.   Past Medical History:      Hypertension      Stroke  Medications:  Anticoagulant use:  Yes Eliquis No Antiplatelet use Reviewed EMR for current medications  Allergies:  Reviewed  Social History: Smoking: No  Family History:  There is no family history of premature cerebrovascular disease pertinent to this consultation  ROS : 14 Points Review of Systems was performed and was negative except mentioned in HPI.  Past Surgical History: There Is No Surgical History Contributory To Todays Visit   Examination: BP(153/91), Pulse(98), Blood Glucose(239) 1A: Level of Consciousness - Alert; keenly responsive + 0 1B: Ask Month and Age - Both Questions Right + 0 1C: Blink Eyes & Squeeze Hands - Performs Both Tasks +  0 2: Test  Horizontal Extraocular Movements - Normal + 0 3: Test Visual Fields - No Visual Loss + 0 4: Test Facial Palsy (Use Grimace if Obtunded) - Minor paralysis (flat nasolabial fold, smile asymmetry) + 1 5A: Test Left Arm Motor Drift - No Drift for 10 Seconds + 0 5B: Test Right Arm Motor Drift - No Drift for 10 Seconds + 0 6A: Test Left Leg Motor Drift - No Drift for 5 Seconds + 0 6B: Test Right Leg Motor Drift - No Drift for 5 Seconds + 0 7: Test Limb Ataxia (FNF/Heel-Shin) - No Ataxia + 0 8: Test Sensation - Mild-Moderate Loss: Less Sharp/More Dull + 1 9: Test Language/Aphasia - Normal; No aphasia + 0 10: Test Dysarthria - Normal + 0 11: Test Extinction/Inattention - No abnormality + 0  NIHSS Score: 2     Patient / Family was informed the Neurology Consult would occur via TeleHealth consult by way of interactive audio and video telecommunications and consented to receiving care in this manner.  Patient is being evaluated for possible acute neurologic impairment and high probability of imminent or life - threatening deterioration.I spent total of 35 minutes providing care to this patient, including time for face to face visit via telemedicine, review of medical records, imaging studies and discussion of findings with providers, the patient and / or family.   Dr Tsosie Billing   [facility] [phone]  Case [id number]

## 2021-07-24 NOTE — Progress Notes (Signed)
Nutrition Brief Note  Patient identified on the Malnutrition Screening Tool (MST) Report  Wt Readings from Last 15 Encounters:  07/23/21 65.8 kg  07/14/21 65.1 kg  01/30/20 73.5 kg  05/05/18 73.8 kg  04/23/18 79 kg  04/20/18 83 kg   Nicole Villegas is a 56 y.o. female with medical history significant for HTN, HLD, DM, GERD, depression, hospitalized from 12/28-12/31 with severe sepsis secondary to E. coli UTI, acute appendicitis and E. coli and Bacteroides bacteremia, s/p laparoscopic appendectomy on 12/28 and discharged on cefdinir for 10 days, who was brought to the ED by EMS for altered mental status.  Blood glucose with EMS was 33 and she received D50 becoming more alert.  Patient states she was feeling fine up until the day she came in though she does report some residual pain left flank which she has had since her surgery.  She denies nausea or vomiting.  Has had no cough or shortness of breath, fever or chills and no diarrhea or dysuria.  Son at bedside reports that since her surgery she has had decreased oral intake but was otherwise recovering as expected.  Pt admitted with mesenteric vein thrombosis.    Reviewed wt hx. Per CareEverywhere, wt has 147# in 09/04/20. Wt has been stable over the past 10 months.   Lab Results  Component Value Date   HGBA1C 12.8 (H) 01/31/2020    PTA DM medications are 39 units insulin aspart TID, 60 units insulin aspart at bedtime, and 50-100 mg janumet BID.   Labs reviewed: CBGS: 164-271 (inpatient orders for glycemic control are 0-15 units insulin aspart TID with meal,s 0-5 units inuslin aspart daily at bedtime, and 30 units inuslin glargine-yfgn).    Current diet order is carb modified, patient is consuming approximately 100% of meals at this time. Labs and medications reviewed.   No nutrition interventions warranted at this time. If nutrition issues arise, please consult RD.   Levada Schilling, RD, LDN, CDCES Registered Dietitian II Certified Diabetes Care  and Education Specialist Please refer to [facility] for RD and/or RD on-call/weekend/after hours pager

## 2021-07-24 NOTE — Progress Notes (Signed)
° ° ° °  Subjective: 07/14/21 pt to ED c/o abd pain with n/v/d x 2days; Code Sepsis called at 1323 s/t e. coli UTI. 07/15/21 Robotic assisted lap appendectomy by Dr Tonna Boehringer. 07/18/21 pt discharged home. 07/22/21 to ED via EMS with hypoglycemia (33) given D50 sugar up to 132 in ED. 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 According to husband, slurred speech and left facial droop present since 07/15/2021.  Patient states vision changes resolved day after hypoglycemic events. Today, MRI/MRA show right internal carotid territory acute ischemia and right internal carotid complete occlusion. Follow up CTA to further visualize shows the occlusion of the right internal carotid artery is at its origin. Small early subacute infarct of the right frontal lobe, withing the right CA territory Objective: Vitals:   07/24/21 1652 07/24/21 2012  BP: (!) 121/91 131/77  Pulse: 90 96  Resp: 16 18  Temp: 98.3 F (36.8 C) 98.1 F (36.7 C)  SpO2: 99% 100%   Stroke risk factors - DM, high cholesterol, high triglycerides,obesity, hypertension   Assessment:  Genneral non distressed Neuro - left facial droop, slurred speech mild, tongue midline, denies vision changes. Extremities with weakness/drift. No ataxia. See nurse documentation of NIHAA SR on monitor, BP stable,   Plan:  -Teleneuro specialist stat consult - repeat CT stat for any neuro changes, repeat  CT head in 24 hours f/u peticcheal hemorrhage, avoid hypotension.  -Lipid panel - statin therapy -Continue eliquis, defer antiplatelet addition at this time -Echo -Improve glucose control - last A1C 2021 was above 12. Diabetes coordinator consult - may betnefit SGLT 2  inhibiltor therapy -nursing for stroke prevention education including behavior modifications, weight loss

## 2021-07-24 NOTE — Progress Notes (Signed)
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   Assessment & Plan:   Principal Problem:   Mesenteric vein thrombosis (HCC) Active Problems:   Hypertension   Acute metabolic encephalopathy   S/P laparoscopic appendectomy   Diabetes mellitus without complication (HCC)   Hypoglycemia due to type 2 diabetes mellitus ([place])   History of bacteremia   Hypoglycemia  # Hypoglycemia # Acute encephalopathy # T2DM Encephalopathy 2/2 hypoglycemia, both resolved. Hypoglycemia likely 2/2 to insulin use in setting of decreased PO after recent surgery - frequent glucose checks - dm educator consult - ssi moderate, semglee 30, aspart 3 w/ meals - hold home meds  # Mesenteric thrombosis Incidental on CT scan. Mild abd pain, low suspicion for ischemia - serial abd exams reassuring - on heparin currently, discussed w/ dr. Delana Meyer, advised start doac, f/u in clinic  # Concern for stroke On speaking with patient today slurred speech noted which husband says has been present for a couple of days. Possible mild right facial droop as well and patient says tongue feels heavy - will check MRI  # HTN Here bp wnl in setting of the above - hold home amlodipine and lisin/hctz for now - cont home atenolol, atorvastatin  # Neuropathy - cont home gabapentin, nortriptyline   DVT prophylaxis: apixaban Code Status: full Family Communication: husband updated @ bedside 1/6  Level of care: med/surg Status is: Inpatient  Remains inpatient appropriate because: severity of illness        Consultants:  vascular  Procedures: none  Antimicrobials:   none    Subjective: Mild abd pain, no n/v/d.  Objective: Vitals:   07/24/21 0457 07/24/21 0731 07/24/21 1137 07/24/21 1157  BP: 125/76 131/76 122/76 131/80  Pulse: 90 98 91 91  Resp: 18 18 18 16   Temp: 99.2 F (37.3 C) 98.4 F (36.9 C) 98.4 F (36.9 C) 98.5 F (36.9 C)  TempSrc: Oral Oral Oral Oral  SpO2: 97% 98% 99% 96%  Weight:      Height:        Intake/Output Summary (Last 24 hours) at 07/24/2021 1431 Last data filed at 07/23/2021 2320 Gross per 24 hour  Intake 1498.63 ml  Output --  Net 1498.63 ml   Filed Weights   07/23/21 2021  Weight: 65.8 kg    Examination:  General exam: Appears calm and comfortable  Respiratory system: Clear to auscultation. Respiratory effort normal. Cardiovascular system: S1 & S2 heard, RRR. No JVD, murmurs, rubs, gallops or clicks. No pedal edema. Gastrointestinal system: Abdomen is nondistended, soft and nontender. No organomegaly or masses felt. Normal bowel sounds heard. Mild diffuse ttp. Healing lap port sites Central nervous system: alert. Slightly slurred speech. Mild left facial droop. 5/5 upper and lower strength Extremities: Symmetric 5 x 5 power. Skin: No rashes, lesions or ulcers Psychiatry: Judgement and insight appear normal. Mood & affect appropriate.     Data Reviewed: I have personally reviewed following labs and imaging studies  CBC: Recent Labs  Lab 07/18/21 0538 07/22/21 2112 07/23/21 0633 07/24/21 0434  WBC 7.4 12.3* 12.2* 9.6  NEUTROABS  --  10.5*  --   --   HGB 11.4* 9.9* 9.4* 9.7*  HCT 33.5* 30.1* 29.1* 29.8*  MCV 82.5 86.5 86.1 84.2  PLT 153 410* 538* [id number]*   Basic Metabolic Panel: Recent Labs  Lab 07/18/21 0538 07/22/21 2112 07/23/21 0633 07/24/21 0434  NA 133* 132* 138 137  K 3.6 3.2* 4.0 4.8  CL 100 100 103 107  CO2 27 22 26 24   GLUCOSE 210* 219* 191* 239*  BUN 13 19 13 11   CREATININE 0.71 0.68 0.65 0.54  CALCIUM 8.3* 8.2* 8.4* 8.4*   GFR: Estimated Creatinine Clearance: 69 mL/min  (by C-G formula based on SCr of 0.54 mg/dL). Liver Function Tests: No results for input(s): AST, ALT, ALKPHOS, BILITOT, PROT, ALBUMIN in the last 168 hours. No results for input(s): LIPASE, AMYLASE in the last 168 hours. No results for input(s): AMMONIA in the last 168 hours. Coagulation Profile: Recent Labs  Lab 07/22/21 2326  INR 1.1   Cardiac Enzymes: No results for input(s): CKTOTAL, CKMB, CKMBINDEX, TROPONINI in the last 168 hours. BNP (last 3 results) No results for input(s): PROBNP in the last 8760 hours. HbA1C: No results for input(s): HGBA1C in the last 72 hours. CBG: Recent Labs  Lab 07/23/21 1626 07/23/21 2028 07/24/21 0756 07/24/21 1159 07/24/21 1416  GLUCAP 255* 271* 222* 207* 243*   Lipid Profile: No results for input(s): CHOL, HDL, LDLCALC, TRIG, CHOLHDL, LDLDIRECT in the last 72 hours. Thyroid Function Tests: No results for input(s): TSH, T4TOTAL, FREET4, T3FREE, THYROIDAB in the last 72 hours. Anemia Panel: No results for input(s): VITAMINB12, FOLATE, FERRITIN, TIBC, IRON, RETICCTPCT in the last 72 hours. Urine analysis:    Component Value Date/Time   COLORURINE YELLOW 07/22/2021 2325   APPEARANCEUR CLEAR 07/22/2021 2325   APPEARANCEUR Clear 05/05/2018 1336   LABSPEC <1.005 (L) 07/22/2021 2325   PHURINE 5.0 07/22/2021 2325   GLUCOSEU 100 (A) 07/22/2021 2325   HGBUR NEGATIVE 07/22/2021 2325   BILIRUBINUR NEGATIVE 07/22/2021 2325   BILIRUBINUR Negative 05/05/2018 1336   KETONESUR NEGATIVE 07/22/2021 2325   PROTEINUR NEGATIVE 07/22/2021 2325   NITRITE NEGATIVE 07/22/2021 2325   LEUKOCYTESUR NEGATIVE 07/22/2021 2325   Sepsis Labs: @LABRCNTIP (procalcitonin:4,lacticidven:4)  ) Recent Results (from the past 240 hour(s))  Surgical PCR screen     Status: Abnormal   Collection Time: 07/15/21  1:32 AM   Specimen: Nasal Mucosa; Nasal Swab  Result Value Ref Range Status   MRSA, PCR NEGATIVE NEGATIVE Final   Staphylococcus aureus POSITIVE (A) NEGATIVE  Final    Comment: (NOTE) The Xpert SA Assay (FDA approved for NASAL specimens in patients 72 years of age and older), is one component of a comprehensive surveillance program. It is not intended to diagnose infection nor to guide or monitor treatment. Performed at [facility], [address] [place], [place] [address]   CULTURE, BLOOD (ROUTINE X 2) w Reflex to ID Panel     Status: None   Collection Time: 07/16/21 10:40 AM   Specimen: BLOOD  Result Value Ref Range Status   Specimen Description BLOOD BLOOD LEFT HAND  Final   Special Requests   Final    BOTTLES DRAWN AEROBIC AND ANAEROBIC Blood Culture results may not be optimal due to an inadequate volume of blood received in culture bottles   Culture   Final    NO GROWTH 5 DAYS Performed at [facility], [address], [place], [place] [address]    Report Status 07/21/2021 FINAL  Final  CULTURE, BLOOD (ROUTINE X 2) w Reflex to ID Panel     Status: None   Collection Time: 07/16/21 10:40  AM   Specimen: BLOOD  Result Value Ref Range Status   Specimen Description BLOOD BLOOD RIGHT HAND  Final   Special Requests   Final    BOTTLES DRAWN AEROBIC AND ANAEROBIC Blood Culture results may not be optimal due to an inadequate volume of blood received in culture bottles   Culture   Final    NO GROWTH 5 DAYS Performed at [facility], [address] [place], [place] [address]    Report Status 07/21/2021 FINAL  Final  Resp Panel by RT-PCR (Flu A&B, Covid) Nasopharyngeal Swab     Status: None   Collection Time: 07/22/21 11:25 PM   Specimen: Nasopharyngeal Swab; Nasopharyngeal(NP) swabs in vial transport medium  Result Value Ref Range Status   SARS Coronavirus 2 by RT PCR NEGATIVE NEGATIVE Final    Comment: (NOTE) SARS-CoV-2 target nucleic acids are NOT DETECTED.  The SARS-CoV-2 RNA is generally detectable in upper respiratory specimens during the acute phase of infection. The lowest concentration of  SARS-CoV-2 viral copies this assay can detect is 138 copies/mL. A negative result does not preclude SARS-Cov-2 infection and should not be used as the sole basis for treatment or other patient management decisions. A negative result may occur with  improper specimen collection/handling, submission of specimen other than nasopharyngeal swab, presence of viral mutation(s) within the areas targeted by this assay, and inadequate number of viral copies(<138 copies/mL). A negative result must be combined with clinical observations, patient history, and epidemiological information. The expected result is Negative.  Fact Sheet for Patients:  EntrepreneurPulse.com.au  Fact Sheet for Healthcare Providers:  IncredibleEmployment.be  This test is no t yet approved or cleared by the [place] FDA and  has been authorized for detection and/or diagnosis of SARS-CoV-2 by FDA under an Emergency Use Authorization (EUA). This EUA will remain  in effect (meaning this test can be used) for the duration of the COVID-19 declaration under Section 564(b)(1) of the Act, 21 U.S.C.section 360bbb-3(b)(1), unless the authorization is terminated  or revoked sooner.       Influenza A by PCR NEGATIVE NEGATIVE Final   Influenza B by PCR NEGATIVE NEGATIVE Final    Comment: (NOTE) The Xpert Xpress SARS-CoV-2/FLU/RSV plus assay is intended as an aid in the diagnosis of influenza from Nasopharyngeal swab specimens and should not be used as a sole basis for treatment. Nasal washings and aspirates are unacceptable for Xpert Xpress SARS-CoV-2/FLU/RSV testing.  Fact Sheet for Patients: EntrepreneurPulse.com.au  Fact Sheet for Healthcare Providers: IncredibleEmployment.be  This test is not yet approved or cleared by the [place] FDA and has been authorized for detection and/or diagnosis of SARS-CoV-2 by FDA under an Emergency Use  Authorization (EUA). This EUA will remain in effect (meaning this test can be used) for the duration of the COVID-19 declaration under Section 564(b)(1) of the Act, 21 U.S.C. section 360bbb-3(b)(1), unless the authorization is terminated or revoked.  Performed at [facility], [address] [place], [place] [address]          Radiology Studies: CT ABDOMEN PELVIS W CONTRAST  Result Date: 07/22/2021 CLINICAL DATA:  Recent surgery. Abdominal pain. Radiologic records reports appendectomy 07/15/2021. EXAM: CT ABDOMEN AND PELVIS WITH CONTRAST TECHNIQUE: Multidetector CT imaging of the abdomen and pelvis was performed using the standard protocol following bolus administration of intravenous contrast. CONTRAST:  129mL OMNIPAQUE IOHEXOL 300 MG/ML  SOLN COMPARISON:  CT 07/14/2021 FINDINGS: Lower chest: No acute airspace disease or pleural effusion. Mild wall thickening of the  distal esophagus. Hepatobiliary: No focal liver abnormality is seen. Mild heterogeneous hepatic perfusion. The gallbladder is partially distended. No calcified gallstone or pericholecystic inflammation. Pancreas: No ductal dilatation or inflammation. Spleen: Normal in size without focal abnormality. Adrenals/Urinary Tract: Normal adrenal glands. No hydronephrosis or perinephric edema. Homogeneous renal enhancement with symmetric excretion on delayed phase imaging. No visualized renal stone or focal lesion. Slight lobulated bilateral renal contours. Urinary bladder is physiologically distended without wall thickening. Stomach/Bowel: Interval appendectomy. No abscess. Ingested material distends the stomach. Minimal distal esophageal wall thickening. No small bowel obstruction. Occasional fluid-filled loops of small bowel in the pelvis. Slight small bowel hyperemia but no definite wall thickening. No abnormal distension, ileus, or obstruction. Fluid/liquid stool in the ascending, transverse, and descending colon. No colonic  inflammation or wall thickening. 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 Thrombus spans approximately 9.6 cm and extends from the mid abdomen to the portal splenic confluence. Mild adjacent peri venous stranding. There is no thrombus extension into the portal or splenic veins. The peripheral venous branches are patent. Multiple prominent central mesenteric lymph nodes with mild mesenteric edema. Aortic atherosclerosis. Reproductive: The uterus is deviated into the left pelvis, unchanged. Calcification in the right ovary. No suspicious adnexal mass. Other: Mesenteric edema with trace mesenteric free fluid. Trace perihepatic free fluid, non organized. No abscess or free air. There is no subcutaneous collection, expected postsurgical changes the abdominal wall. Musculoskeletal: Stable osseous structures. IMPRESSION: 1. 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 Mild adjacent peri venous stranding. There is no thrombus extension into the portal or splenic veins. 2. Interval appendectomy. No abscess. 3. Mild small bowel hyperemia. No obstruction or ileus. Liquid stool/fluid in the colon. 4. Mild distal esophageal wall thickening, can be seen with reflux or esophagitis. 5. Mild heterogeneous hepatic perfusion, nonspecific. Aortic Atherosclerosis (ICD10-I70.0). These results were called by telephone at the time of interpretation on 07/22/2021 at 11:07 pm to provider [facility] , who verbally acknowledged these results. Electronically Signed   By: Keith Rake M.D.   On: 07/22/2021 23:08        Scheduled Meds:  atenolol  50 mg Oral Daily   atorvastatin  80 mg Oral Daily   dextrose  25 mL Intravenous Once   gabapentin  300 mg Oral BID   insulin aspart  0-15 Units Subcutaneous TID WC   insulin aspart  0-5 Units Subcutaneous QHS   insulin glargine-yfgn  30 Units Subcutaneous QHS   nortriptyline  20 mg Oral  QHS   pantoprazole  40 mg Oral Daily   Continuous Infusions:  dextrose 5 % and 0.9 % NaCl with KCl 40 mEq/L 50 mL/hr at 07/23/21 2320   heparin 1,050 Units/hr (07/23/21 2320)     LOS: 1 day    Time spent: 30 min    Desma Maxim, MD [facility]   If 7PM-7AM, please contact night-coverage [email or web address] Password [facility] 07/24/2021, 2:31 PM

## 2021-07-24 NOTE — TOC CM/SW Note (Signed)
Per chart review, patient will discharge on new prescription for Eliquis. Printed 30-day free trial card and $10 copay card to the unit. Asked RN and pharmacist to give to patient or put on her chart to go home with her at discharge.  Dayton Scrape, [place]

## 2021-07-24 NOTE — Progress Notes (Signed)
Called Manuela Schwartz  NP as MRI was truing to reach her with results, she is calling MRI right back.

## 2021-07-25 ENCOUNTER — Inpatient Hospital Stay (HOSPITAL_COMMUNITY)
Admit: 2021-07-25 | Discharge: 2021-07-25 | Disposition: A | Discharge status: Home / Self Care | Payer: BC Managed Care – PPO | Attending: Obstetrics and Gynecology | Admitting: Obstetrics and Gynecology

## 2021-07-25 ENCOUNTER — Inpatient Hospital Stay: Payer: BC Managed Care – PPO

## 2021-07-25 DIAGNOSIS — I6389 Other cerebral infarction: Secondary | ICD-10-CM

## 2021-07-25 DIAGNOSIS — G9341 Metabolic encephalopathy: Secondary | ICD-10-CM

## 2021-07-25 LAB — ECHOCARDIOGRAM COMPLETE BUBBLE STUDY
AR max vel: 2.32 cm2
AV Peak grad: 5.4 mmHg
Ao pk vel: 1.16 m/s
Area-P 1/2: 7.09 cm2
Calc EF: 59.4 %
S' Lateral: 2.7 cm
Single Plane A2C EF: 61.5 %
Single Plane A4C EF: 56.8 %

## 2021-07-25 LAB — COMPREHENSIVE METABOLIC PANEL
ALT: 15 U/L (ref 0–44)
AST: 17 U/L (ref 15–41)
Albumin: 2.5 g/dL — ABNORMAL LOW (ref 3.5–5.0)
Alkaline Phosphatase: 124 U/L (ref 38–126)
Anion gap: 7 (ref 5–15)
BUN: 10 mg/dL (ref 6–20)
CO2: 23 mmol/L (ref 22–32)
Calcium: 8.5 mg/dL — ABNORMAL LOW (ref 8.9–10.3)
Chloride: 105 mmol/L (ref 98–111)
Creatinine, Ser: 0.66 mg/dL (ref 0.44–1.00)
GFR, Estimated: >60 mL/min (ref >60)
Glucose, Bld: 153 mg/dL — ABNORMAL HIGH (ref 70–99)
Potassium: 4.2 mmol/L (ref 3.5–5.1)
Sodium: 135 mmol/L (ref 135–145)
Total Bilirubin: 0.5 mg/dL (ref 0.3–1.2)
Total Protein: 7 g/dL (ref 6.5–8.1)

## 2021-07-25 LAB — GLUCOSE, CAPILLARY
Glucose-Capillary: 146 mg/dL — ABNORMAL HIGH (ref 70–99)
Glucose-Capillary: 190 mg/dL — ABNORMAL HIGH (ref 70–99)
Glucose-Capillary: 161 mg/dL — ABNORMAL HIGH (ref 70–99)
Glucose-Capillary: 236 mg/dL — ABNORMAL HIGH (ref 70–99)

## 2021-07-25 LAB — PHOSPHORUS: Phosphorus: 3.9 mg/dL (ref 2.5–4.6)

## 2021-07-25 LAB — MAGNESIUM: Magnesium: 1.3 mg/dL — ABNORMAL LOW (ref 1.7–2.4)

## 2021-07-25 LAB — HEMOGLOBIN A1C
Hgb A1c MFr Bld: 13 % — ABNORMAL HIGH (ref 4.8–5.6)
Mean Plasma Glucose: 326.4 mg/dL

## 2021-07-25 MED ORDER — MAGNESIUM SULFATE 50 % IJ SOLN: 3.0000 g | Freq: Once | INTRAVENOUS | Status: DC

## 2021-07-25 MED ORDER — MAGNESIUM SULFATE 4 GM/100ML IV SOLN
4.0000 g | Freq: Once | INTRAVENOUS | Status: AC
  Administered 2021-07-25: 4 g via INTRAVENOUS
  Filled 2021-07-25: qty 100

## 2021-07-25 NOTE — Consult Note (Signed)
Neurology Consultation Reason for Consult: Stroke Referring Physician: Ashok Pall, N  CC: Facial droop  History is obtained from: Patient, husband, translator iPad was used  HPI: Nicole Villegas is a 56 y.o. female with a history of hypertension, hyperlipidemia, diabetes who presents with slurred speech and facial droop that has been present for approximately 10 days.  She first noticed it during her previous hospitalization, but given the other issues that she had that prompted that hospitalization, there was more concerned about her abdominal pain, and need for appendectomy.  She has had some persistent left-sided numbness and slurred speech since that time.  She represented with hypoglycemia, and then work-up revealed mesenteric vein thrombosis.   Past Medical History:  Diagnosis Date   Diabetes mellitus without complication (HCC)    Hyperlipidemia    Hypertension      Family History  Problem Relation Age of Onset   Diabetes Mellitus I Maternal Aunt    Hypertension Maternal Aunt      Social History:  reports that she has never smoked. She has never used smokeless tobacco. She reports that she does not currently use alcohol. She reports that she does not currently use drugs.   Exam: Current vital signs: BP 125/74 (BP Location: Left Arm)    Pulse 85    Temp 98.4 F (36.9 C) (Oral)    Resp 18    Ht 5\' 1"  (1.549 m)    Wt 65.8 kg    SpO2 98%    BMI 27.41 kg/m  Vital signs in last 24 hours: Temp:  [97.6 F (36.4 C)-98.5 F (36.9 C)] 98.4 F (36.9 C) (01/07 1547) Pulse Rate:  [85-96] 85 (01/07 1547) Resp:  [14-18] 18 (01/07 1547) BP: (117-146)/(71-95) 125/74 (01/07 1547) SpO2:  [96 %-100 %] 98 % (01/07 1547)   Physical Exam  Constitutional: Appears well-developed and well-nourished.  Psych: Affect appropriate to situation Eyes: No scleral injection HENT: No OP obstruction MSK: no joint deformities.  Cardiovascular: Normal rate and regular rhythm.  Respiratory: Effort normal,  non-labored breathing GI: Soft.  No distension. There is no tenderness.  Skin: WDI  Neuro: Mental Status: Patient is awake, alert, oriented to person, place, month, year, and situation. Patient is able to give a clear and coherent history. No signs of aphasia or neglect Cranial Nerves: II: Visual Fields are full. Pupils are equal, round, and reactive to light.   III,IV, VI: EOMI without ptosis or diploplia.  V: Facial sensation is symmetric to touch VII: Facial movement with left facial weakness VIII: hearing is intact to voice X: Uvula elevates symmetrically XI: Shoulder shrug is symmetric. XII: tongue is midline without atrophy or fasciculations.  Motor: Tone is normal. Bulk is normal. 5/5 strength was present in all four extremities.  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 She was not particularly hypotensive except for one measurement with induction. Possibilities include chronic ICA occlusion with ischemia in the setting of hypotension vs acute ICA occlusion with distal embolization. Embolization to the ICA is felt less likely.   She is 10 days out with stable symptoms, and therefore I agree that the  risk/benefit ratio favor treating her mesenteric vein thrombosis with anticoagulation. Once she no longer needs anticoagulation, would start antiplatelet therapy at that time.   Recommendations: 1) Agree with anticoagulation 2) LDL is at goal 3) She will need PT,OT,ST 4) Follow up with outpatient neurology   Ritta Slot, MD [facility] [phone]  If 7pm- 7am, please page neurology on call as listed  in AMION.

## 2021-07-25 NOTE — Progress Notes (Signed)
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   Assessment & Plan:   Principal Problem:   Mesenteric vein thrombosis (HCC) Active Problems:   Hypertension   Acute metabolic encephalopathy   S/P laparoscopic appendectomy   Diabetes mellitus without complication (HCC)   Hypoglycemia due to type 2 diabetes mellitus (HCC)   History of bacteremia   Hypoglycemia  # Hypoglycemia # Acute encephalopathy # T2DM Encephalopathy 2/2 hypoglycemia, both resolved. Hypoglycemia likely 2/2 to insulin use in setting of decreased PO after recent surgery - frequent glucose checks - dm educator consult - ssi moderate, semglee 30, aspart 3 w/ meals - hold home meds  # CVA Patient never mentioned neurologic deficits but on 1/6 patient noted to be slurring words and also complained of heavy sensation of tongue. Mild left facial droop also noted. STAT MRI showed multifocal acute stroke right MCA territory. CTA shows complete occlusion of right ICA. Small amount of petechial blood. Symptoms began 10 days ago - swallow screen - TTE pending - tele - neuro to see - cont statin, doac - given mesenteric thrombosis will discuss w/ neuro whether they think a hypercoagulable or other etiology may be at play  # Mesenteric thrombosis Incidental on CT scan. Mild abd pain, low suspicion for ischemia - serial abd exams reassuring - have transitioned to eliquis - vascular advises outpt f/u dr. Gilda Creaseschnier  # HTN Here bp wnl in setting of the above - hold home amlodipine and lisin/hctz for now - cont home atenolol, atorvastatin  # Neuropathy - cont home gabapentin,  nortriptyline  # Recent hospitalization for acute appendicitis, e coli bacteremia - s/p lap appy, abx   DVT prophylaxis: apixaban Code Status: full Family Communication: son updated @ bedside 1/6  Level of care: med/surg Status is: Inpatient  Remains inpatient appropriate because: severity of illness        Consultants:  vascular  Procedures: none  Antimicrobials:  none    Subjective: Mild abd pain, no n/v/d.  Objective: Vitals:   07/25/21 0234 07/25/21 0528 07/25/21 0809 07/25/21 0920  BP: 117/73 127/72 (!) 145/79 (!) 146/95  Pulse: 87 90 87 96  Resp:  18 14   Temp: 97.8 F (36.6 C) 97.6 F (36.4 C) 98.4 F (36.9 C) 98 F (36.7 C)  TempSrc: Oral Oral Oral Oral  SpO2: 98% 98% 98% 98%  Weight:      Height:        Intake/Output Summary (Last 24 hours) at 07/25/2021 1127 Last data filed at 07/24/2021 1900 Gross per 24 hour  Intake 1218.15 ml  Output --  Net 1218.15 ml   Filed Weights   07/23/21 2021  Weight: 65.8 kg    Examination:  General exam: Appears calm and comfortable  Respiratory system: Clear to auscultation. Respiratory effort normal. Cardiovascular system: S1 & S2 heard, RRR. No JVD, murmurs, rubs, gallops or clicks. No pedal edema. Gastrointestinal system: Abdomen is nondistended, soft and nontender. No organomegaly or masses felt. Normal bowel sounds heard. Mild diffuse ttp. Healing lap port sites Central nervous system: alert. Slightly slurred speech. Mild left facial droop. 5/5 upper and lower strength Extremities: Symmetric 5 x 5 power. Skin: No rashes, lesions or ulcers Psychiatry: Judgement and  insight appear normal. Mood & affect appropriate.     Data Reviewed: I have personally reviewed following labs and imaging studies  CBC: Recent Labs  Lab 07/22/21 2112 07/23/21 0633 07/24/21 0434  WBC 12.3* 12.2* 9.6  NEUTROABS 10.5*  --   --   HGB 9.9* 9.4* 9.7*  HCT 30.1* 29.1* 29.8*  MCV 86.5 86.1 84.2  PLT 410* 538* 548*    Basic Metabolic Panel: Recent Labs  Lab 07/22/21 2112 07/23/21 0633 07/24/21 0434 07/25/21 0409  NA 132* 138 137 135  K 3.2* 4.0 4.8 4.2  CL 100 103 107 105  CO2 22 26 24 23   GLUCOSE 219* 191* 239* 153*  BUN 19 13 11 10   CREATININE 0.68 0.65 0.54 0.66  CALCIUM 8.2* 8.4* 8.4* 8.5*  MG  --   --   --  1.3*  PHOS  --   --   --  3.9   GFR: Estimated Creatinine Clearance: 69 mL/min (by C-G formula based on SCr of 0.66 mg/dL). Liver Function Tests: Recent Labs  Lab 07/25/21 0409  AST 17  ALT 15  ALKPHOS 124  BILITOT 0.5  PROT 7.0  ALBUMIN 2.5*   No results for input(s): LIPASE, AMYLASE in the last 168 hours. No results for input(s): AMMONIA in the last 168 hours. Coagulation Profile: Recent Labs  Lab 07/22/21 2326  INR 1.1   Cardiac Enzymes: No results for input(s): CKTOTAL, CKMB, CKMBINDEX, TROPONINI in the last 168 hours. BNP (last 3 results) No results for input(s): PROBNP in the last 8760 hours. HbA1C: No results for input(s): HGBA1C in the last 72 hours. CBG: Recent Labs  Lab 07/24/21 1159 07/24/21 1416 07/24/21 1653 07/24/21 2015 07/25/21 0810  GLUCAP 207* 243* 195* 229* 146*   Lipid Profile: Recent Labs    07/24/21 2259  CHOL 76  HDL 29*  LDLCALC 28  TRIG 97  CHOLHDL 2.6   Thyroid Function Tests: No results for input(s): TSH, T4TOTAL, FREET4, T3FREE, THYROIDAB in the last 72 hours. Anemia Panel: No results for input(s): VITAMINB12, FOLATE, FERRITIN, TIBC, IRON, RETICCTPCT in the last 72 hours. Urine analysis:    Component Value Date/Time   COLORURINE YELLOW 07/22/2021 2325   APPEARANCEUR CLEAR 07/22/2021 2325   APPEARANCEUR Clear 05/05/2018 1336   LABSPEC <1.005 (L) 07/22/2021 2325   PHURINE 5.0 07/22/2021 2325   GLUCOSEU 100 (A) 07/22/2021 2325   HGBUR NEGATIVE 07/22/2021 2325   BILIRUBINUR NEGATIVE 07/22/2021 2325   BILIRUBINUR Negative 05/05/2018 1336   KETONESUR NEGATIVE 07/22/2021 2325   PROTEINUR NEGATIVE 07/22/2021 2325    NITRITE NEGATIVE 07/22/2021 2325   LEUKOCYTESUR NEGATIVE 07/22/2021 2325   Sepsis Labs: @LABRCNTIP (procalcitonin:4,lacticidven:4)  ) Recent Results (from the past 240 hour(s))  CULTURE, BLOOD (ROUTINE X 2) w Reflex to ID Panel     Status: None   Collection Time: 07/16/21 10:40 AM   Specimen: BLOOD  Result Value Ref Range Status   Specimen Description BLOOD BLOOD LEFT HAND  Final   Special Requests   Final    BOTTLES DRAWN AEROBIC AND ANAEROBIC Blood Culture results may not be optimal due to an inadequate volume of blood received in culture bottles   Culture   Final    NO GROWTH 5 DAYS Performed at [facility], [address] [place], [facility] [address]    Report Status 07/21/2021 FINAL  Final  CULTURE, BLOOD (ROUTINE X 2) w Reflex to ID Panel     Status: None   Collection Time: 07/16/21 10:40 AM  Specimen: BLOOD  Result Value Ref Range Status   Specimen Description BLOOD BLOOD RIGHT HAND  Final   Special Requests   Final    BOTTLES DRAWN AEROBIC AND ANAEROBIC Blood Culture results may not be optimal due to an inadequate volume of blood received in culture bottles   Culture   Final    NO GROWTH 5 DAYS Performed at [facility], [address] [place], [place] [address]    Report Status 07/21/2021 FINAL  Final  Resp Panel by RT-PCR (Flu A&B, Covid) Nasopharyngeal Swab     Status: None   Collection Time: 07/22/21 11:25 PM   Specimen: Nasopharyngeal Swab; Nasopharyngeal(NP) swabs in vial transport medium  Result Value Ref Range Status   SARS Coronavirus 2 by RT PCR NEGATIVE NEGATIVE Final    Comment: (NOTE) SARS-CoV-2 target nucleic acids are NOT DETECTED.  The SARS-CoV-2 RNA is generally detectable in upper respiratory specimens during the acute phase of infection. The lowest concentration of SARS-CoV-2 viral copies this assay can detect is 138 copies/mL. A negative result does not preclude SARS-Cov-2 infection and should not be used as the sole basis  for treatment or other patient management decisions. A negative result may occur with  improper specimen collection/handling, submission of specimen other than nasopharyngeal swab, presence of viral mutation(s) within the areas targeted by this assay, and inadequate number of viral copies(<138 copies/mL). A negative result must be combined with clinical observations, patient history, and epidemiological information. The expected result is Negative.  Fact Sheet for Patients:  BloggerCourse.com  Fact Sheet for Healthcare Providers:  SeriousBroker.it  This test is no t yet approved or cleared by the [place] FDA and  has been authorized for detection and/or diagnosis of SARS-CoV-2 by FDA under an Emergency Use Authorization (EUA). This EUA will remain  in effect (meaning this test can be used) for the duration of the COVID-19 declaration under Section 564(b)(1) of the Act, 21 U.S.C.section 360bbb-3(b)(1), unless the authorization is terminated  or revoked sooner.       Influenza A by PCR NEGATIVE NEGATIVE Final   Influenza B by PCR NEGATIVE NEGATIVE Final    Comment: (NOTE) The Xpert Xpress SARS-CoV-2/FLU/RSV plus assay is intended as an aid in the diagnosis of influenza from Nasopharyngeal swab specimens and should not be used as a sole basis for treatment. Nasal washings and aspirates are unacceptable for Xpert Xpress SARS-CoV-2/FLU/RSV testing.  Fact Sheet for Patients: BloggerCourse.com  Fact Sheet for Healthcare Providers: SeriousBroker.it  This test is not yet approved or cleared by the [place] FDA and has been authorized for detection and/or diagnosis of SARS-CoV-2 by FDA under an Emergency Use Authorization (EUA). This EUA will remain in effect (meaning this test can be used) for the duration of the COVID-19 declaration under Section 564(b)(1) of the Act, 21  U.S.C. section 360bbb-3(b)(1), unless the authorization is terminated or revoked.  Performed at [facility], [address] [place], [place] [address]          Radiology Studies: CT ANGIO HEAD NECK W WO CM  Result Date: 07/24/2021 CLINICAL DATA:  Right internal carotid artery occlusion EXAM: CT ANGIOGRAPHY HEAD AND NECK TECHNIQUE: Multidetector CT imaging of the head and neck was performed using the standard protocol during bolus administration of intravenous contrast. Multiplanar CT image reconstructions and MIPs were obtained to evaluate the vascular anatomy. Carotid stenosis measurements (when applicable) are obtained utilizing NASCET criteria, using the distal internal carotid diameter as the denominator. CONTRAST:  4mL OMNIPAQUE IOHEXOL 350 MG/ML SOLN COMPARISON:  MRA head 07/24/2021 FINDINGS: CT HEAD FINDINGS Brain: The size and configuration of the ventricles and extra-axial CSF spaces are normal. Small early subacute infarct of the right frontal lobe, within the right MCA territory. Small amount of petechial blood. The brain parenchyma is normal. Skull: The visualized skull base, calvarium and extracranial soft tissues are normal. Sinuses/Orbits: No fluid levels or advanced mucosal thickening of the visualized paranasal sinuses. No mastoid or middle ear effusion. The orbits are normal. CTA NECK FINDINGS SKELETON: There is no bony spinal canal stenosis. No lytic or blastic lesion. OTHER NECK: Normal pharynx, larynx and major salivary glands. No cervical lymphadenopathy. Unremarkable thyroid gland. UPPER CHEST: No pneumothorax or pleural effusion. No nodules or masses. AORTIC ARCH: There is calcific atherosclerosis of the aortic arch. There is no aneurysm, dissection or hemodynamically significant stenosis of the visualized portion of the aorta. Conventional 3 vessel aortic branching pattern. The visualized proximal subclavian arteries are widely patent. RIGHT CAROTID SYSTEM: Right  internal carotid artery is occluded at its origin. Moderate mixed density plaque at the carotid bifurcation. No reconstitution of the ICA in the neck. The ECA is patent. LEFT CAROTID SYSTEM: Mild calcification in the distal left common carotid artery. Normal carotid bifurcation. The left ICA is normal. VERTEBRAL ARTERIES: Left dominant configuration. Both origins are clearly patent. There is no dissection, occlusion or flow-limiting stenosis to the skull base (V1-V3 segments). CTA HEAD FINDINGS POSTERIOR CIRCULATION: --Vertebral arteries: Normal V4 segments. --Inferior cerebellar arteries: Normal. --Basilar artery: Normal. --Superior cerebellar arteries: Normal. --Posterior cerebral arteries (PCA): Normal. ANTERIOR CIRCULATION: --Intracranial internal carotid arteries: Right ICA occluded. Normal left ICA. --Anterior cerebral arteries (ACA): Mild stenosis of the proximal right A2 segment. Otherwise normal. --Middle cerebral arteries (MCA): Right MCA is patent via collateral flow. Normal left MCA. VENOUS SINUSES: As permitted by contrast timing, patent. ANATOMIC VARIANTS: None Review of the MIP images confirms the above findings. IMPRESSION: 1. 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 2. Small early subacute infarct of the right frontal lobe, within the right MCA territory. Small amount of petechial blood. Aortic Atherosclerosis (ICD10-I70.0). Electronically Signed   By: Deatra Robinson M.D.   On: 07/24/2021 21:26   MR ANGIO HEAD WO CONTRAST  Result Date: 07/24/2021 CLINICAL DATA:  Acute neurologic deficit EXAM: MRA HEAD WITHOUT CONTRAST TECHNIQUE: Angiographic images of the Circle of Willis were acquired using MRA technique without intravenous contrast. COMPARISON:  No pertinent prior exam. FINDINGS: POSTERIOR CIRCULATION: --Vertebral arteries: Normal --Inferior cerebellar arteries: Normal. --Basilar artery: Normal. --Superior cerebellar arteries: Normal. --Posterior  cerebral arteries: Normal. ANTERIOR CIRCULATION: --Intracranial internal carotid arteries: The visualized course of the right internal carotid artery is completely occluded. There is irregularity of the left ICA that may be due to atherosclerosis. --Anterior cerebral arteries (ACA): Moderate stenosis of the proximal right A2 segment. Otherwise normal. --Middle cerebral arteries (MCA): Normal. ANATOMIC VARIANTS: None IMPRESSION: 1. Complete occlusion of the right internal carotid artery. This could be more completely assessed with CTA of the neck. 2. Irregularity of the left ICA may be secondary to atherosclerosis. 3. Moderate stenosis of the proximal right ACA A2 segment. These results were called by telephone at the time of interpretation on 07/24/2021 at 8:25 pm to provider [facility] , who verbally acknowledged these results. Electronically Signed   By: Deatra Robinson M.D.   On: 07/24/2021 20:25   MR BRAIN WO CONTRAST  Result Date: 07/24/2021 CLINICAL DATA:  Sepsis and  altered mental status EXAM: MRI HEAD WITHOUT CONTRAST TECHNIQUE: Multiplanar, multiecho pulse sequences of the brain and surrounding structures were obtained without intravenous contrast. COMPARISON:  None. FINDINGS: Brain: Multifocal acute ischemia in the right MCA territory. The areas of ischemia predominantly cortical/subcortical. Small amount of associated petechial blood. There are old bilateral thalamic infarcts. There is multifocal hyperintense T2-weighted signal within the white matter. Generalized volume loss without a clear lobar predilection. The midline structures are normal. Vascular: Major flow voids are preserved. Skull and upper cervical spine: Normal calvarium and skull base. Visualized upper cervical spine and soft tissues are normal. Sinuses/Orbits:No paranasal sinus fluid levels or advanced mucosal thickening. No mastoid or middle ear effusion. Normal orbits. IMPRESSION: 1. Multifocal acute ischemia in the right MCA  territory, predominantly cortical/subcortical. Small amount of associated petechial blood. 2. 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 Electronically Signed   By: Deatra RobinsonKevin  Herman M.D.   On: 07/24/2021 19:34        Scheduled Meds:  apixaban  10 mg Oral BID   Followed by   Melene Muller[START ON 07/31/2021] apixaban  5 mg Oral BID   atenolol  50 mg Oral Daily   atorvastatin  80 mg Oral QHS   dextrose  25 mL Intravenous Once   gabapentin  300 mg Oral BID   insulin aspart  0-15 Units Subcutaneous TID WC   insulin aspart  0-5 Units Subcutaneous QHS   insulin aspart  3 Units Subcutaneous TID WC   insulin glargine-yfgn  30 Units Subcutaneous QHS   nortriptyline  20 mg Oral QHS   pantoprazole  40 mg Oral Daily   Continuous Infusions:  magnesium sulfate bolus IVPB       LOS: 2 days    Time spent: 30 min    Silvano BilisNoah B Takao Lizer, MD [facility]   If 7PM-7AM, please contact night-coverage [email or web address] Password [email or web address] 07/25/2021, 11:27 AM

## 2021-07-25 NOTE — Progress Notes (Signed)
OT Cancellation Note  Patient Details Name: Nicole Villegas MRN: [id number] DOB: [birth year]   Cancelled Treatment:    Reason Eval/Treat Not Completed: OT screened, no needs identified, will sign off Upon chart review and in speaking with PT/RN, pt appears to be at functional baseline with ADLs with no acute OT needs at this time. Will complete order.   Rejeana Brock, MS, OTR/L ascom [phone] 07/25/21, 3:39 PM

## 2021-07-25 NOTE — Progress Notes (Signed)
*  PRELIMINARY RESULTS* Echocardiogram 2D Echocardiogram has been performed. Saline Microcavitation (Bubble Study) was requested and completed on this study.  Lenor Coffin 07/25/2021, 2:57 PM

## 2021-07-25 NOTE — TOC Progression Note (Signed)
Transition of Care [facility]) - Progression Note    Patient Details  Name: Nicole Villegas MRN: [id number] Date of Birth: 10-31-1965  Transition of [facility]) CM/SW Contact  [name], RN Phone Number: 07/25/2021, 12:28 PM  Clinical Narrative: Unit RN contacted RN CM that Eliquis coupon given 1/6 shows an expiration date of 07/18/21. Checked website and hard cards in [facility] and all have same 07/18/21 expiration date. [place] TOC Supervisor on unit and said they should still be good as they have not replaced them yet. Relayed information to Unit RN and to put RN CM number on AVS in case there are issues at pharmacy. Simmie Davies RN CM            Expected Discharge Plan and Services                                                 Social Determinants of Health (SDOH) Interventions    Readmission Risk Interventions No flowsheet data found.

## 2021-07-26 DIAGNOSIS — I639 Cerebral infarction, unspecified: Secondary | ICD-10-CM

## 2021-07-26 LAB — BASIC METABOLIC PANEL
Anion gap: 8 (ref 5–15)
BUN: 12 mg/dL (ref 6–20)
CO2: 24 mmol/L (ref 22–32)
Calcium: 8.6 mg/dL — ABNORMAL LOW (ref 8.9–10.3)
Chloride: 99 mmol/L (ref 98–111)
Creatinine, Ser: 0.75 mg/dL (ref 0.44–1.00)
GFR, Estimated: >60 mL/min (ref >60)
Glucose, Bld: 168 mg/dL — ABNORMAL HIGH (ref 70–99)
Potassium: 4.4 mmol/L (ref 3.5–5.1)
Sodium: 131 mmol/L — ABNORMAL LOW (ref 135–145)

## 2021-07-26 LAB — GLUCOSE, CAPILLARY: Glucose-Capillary: 150 mg/dL — ABNORMAL HIGH (ref 70–99)

## 2021-07-26 LAB — MAGNESIUM: Magnesium: 1.8 mg/dL (ref 1.7–2.4)

## 2021-07-26 MED ORDER — INSULIN DETEMIR 100 UNIT/ML ~~LOC~~ SOLN: 30.0000 [IU] | Freq: Every day | SUBCUTANEOUS | 11 refills | Status: AC

## 2021-07-26 MED ORDER — APIXABAN 5 MG PO TABS: ORAL_TABLET | ORAL | 1 refills | Status: AC

## 2021-07-26 MED ORDER — GLUCOSE BLOOD VI STRP: ORAL_STRIP | 12 refills | Status: AC

## 2021-07-26 MED ORDER — INSULIN LISPRO (1 UNIT DIAL) 100 UNIT/ML (KWIKPEN): PEN_INJECTOR | SUBCUTANEOUS | 11 refills | Status: AC

## 2021-07-26 MED ORDER — INSULIN ASPART 100 UNIT/ML FLEXPEN: 5.0000 [IU] | PEN_INJECTOR | SUBCUTANEOUS | 11 refills | Status: DC

## 2021-07-26 NOTE — Plan of Care (Signed)
Patient ID: Nicole Villegas, female   DOB: 07-29-1965, 56 y.o.   MRN: [id number]  Problem: Education: Goal: Knowledge of General Education information will improve Description: Including pain rating scale, medication(s)/side effects and non-pharmacologic comfort measures Outcome: Adequate for Discharge   Problem: Health Behavior/Discharge Planning: Goal: Ability to manage health-related needs will improve Outcome: Adequate for Discharge   Problem: Clinical Measurements: Goal: Ability to maintain clinical measurements within normal limits will improve Outcome: Adequate for Discharge Goal: Will remain free from infection Outcome: Adequate for Discharge Goal: Diagnostic test results will improve Outcome: Adequate for Discharge Goal: Respiratory complications will improve Outcome: Adequate for Discharge Goal: Cardiovascular complication will be avoided Outcome: Adequate for Discharge   Problem: Activity: Goal: Risk for activity intolerance will decrease Outcome: Adequate for Discharge   Problem: Nutrition: Goal: Adequate nutrition will be maintained Outcome: Adequate for Discharge   Problem: Coping: Goal: Level of anxiety will decrease Outcome: Adequate for Discharge   Problem: Elimination: Goal: Will not experience complications related to bowel motility Outcome: Adequate for Discharge Goal: Will not experience complications related to urinary retention Outcome: Adequate for Discharge   Problem: Pain Managment: Goal: General experience of comfort will improve Outcome: Adequate for Discharge   Problem: Safety: Goal: Ability to remain free from injury will improve Outcome: Adequate for Discharge   Problem: Skin Integrity: Goal: Risk for impaired skin integrity will decrease Outcome: Adequate for Discharge   Problem: Education: Goal: Knowledge of disease or condition will improve Outcome: Adequate for Discharge Goal: Knowledge of secondary prevention will improve (SELECT  ALL) Outcome: Adequate for Discharge Goal: Knowledge of patient specific risk factors will improve (INDIVIDUALIZE FOR PATIENT) Outcome: Adequate for Discharge   Problem: Coping: Goal: Will verbalize positive feelings about self Outcome: Adequate for Discharge Goal: Will identify appropriate support needs Outcome: Adequate for Discharge   Problem: Health Behavior/Discharge Planning: Goal: Ability to manage health-related needs will improve Outcome: Adequate for Discharge   Problem: Self-Care: Goal: Ability to participate in self-care as condition permits will improve Outcome: Adequate for Discharge Goal: Verbalization of feelings and concerns over difficulty with self-care will improve Outcome: Adequate for Discharge Goal: Ability to communicate needs accurately will improve Outcome: Adequate for Discharge   Problem: Nutrition: Goal: Risk of aspiration will decrease Outcome: Adequate for Discharge   Problem: Ischemic Stroke/TIA Tissue Perfusion: Goal: Complications of ischemic stroke/TIA will be minimized Outcome: Adequate for Discharge  [name], RN

## 2021-07-26 NOTE — Discharge Instructions (Signed)
Check and record fasting sugars every day as well as sugar 1 hour after each meal

## 2021-07-26 NOTE — Progress Notes (Signed)
° ° °  Patient ID: Nicole Villegas, female   DOB: 07/17/1966, 56 y.o.   MRN: [id number]   07/26/21 1429  AVS Discharge Documentation  AVS Discharge Instructions Including Medications Provided to patient/caregiver  Name of Person Receiving AVS Discharge Instructions Including Medications Nicole Villegas  Name of Clinician That Reviewed AVS Discharge Instructions Including Medications Patterson Hammersmith RN   An After Visit Summary was printed and given to the patient.  Patient education given on medication changes and follow up appointments,and the patient expresses understanding and acceptance of instructions.  Interpretuer services utilized. [name] 07/26/2021 2:30 PM

## 2021-07-26 NOTE — TOC Transition Note (Signed)
Transition of Care [facility]) - CM/SW Discharge Note   Patient Details  Name: Nicole Villegas MRN: [id number] Date of Birth: [birth year]  Transition [facility]) CM/SW Contact:  [name], RN Phone Number: 07/26/2021, 10:36 AM   Clinical Narrative: 1/8: Patient has discharge orders to home/self care for today. No therapy recommended per PT/OT evaluations on 07/25/20. Eliquis coupons delivered to patient on 1/6 and 1/7. No further TOC needs identified at this time. Simmie Davies RN CM    Final next level of care: Home/Self Care Barriers to Discharge: Barriers Resolved   Patient Goals and CMS Choice     Choice offered to / list presented to : NA  Discharge Placement                       Discharge Plan and Services                DME Arranged: N/A DME Agency: NA       HH Arranged: NA HH Agency: NA        Social Determinants of Health (SDOH) Interventions     Readmission Risk Interventions No flowsheet data found.

## 2021-07-26 NOTE — Discharge Summary (Addendum)
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 female with medical history significant for HTN, HLD, DM, GERD, depression, hospitalized from 12/28-12/31 with severe sepsis secondary to E. coli UTI, acute appendicitis and E. coli and Bacteroides bacteremia, s/p laparoscopic appendectomy on 12/28 and discharged on cefdinir for 10 days, who was brought to the ED by EMS for altered mental status.  Blood glucose with EMS was 33 and she received D50 becoming more alert.  Patient states she was feeling fine up until the day she came in though she does report some residual pain left flank which she has had since her surgery.  She denies nausea or vomiting.  Has had no cough or shortness of breath, fever or chills and no diarrhea or dysuria.  Son at bedside reports that since her surgery she has had decreased oral intake but was otherwise recovering as expected.  Hospital Course:  # Hypoglycemia # Acute encephalopathy # T2DM Encephalopathy 2/2 hypoglycemia, both resolved. Hypoglycemia likely 2/2 to insulin use in setting of decreased PO after recent surgery - home w/ decreased dose insulin, regular meals, frequent glucose checks, and close f/u with pcp and  endocrinology   # CVA Patient never mentioned neurologic deficits but on 1/6 patient noted to be slurring words and also complained of heavy sensation of tongue. Mild left facial droop also noted. STAT MRI showed multifocal acute stroke right MCA territory. CTA shows complete occlusion of right ICA. Small amount of petechial blood. Repeat CTA stable. TTE unremarkable. No abnormal rhythm on tele. Symptoms began 10 days ago - neuro f/u - cont statin, doac, transition to antiplatelet when off doac  # Mesenteric thrombosis Incidental on CT scan. Mild abd pain, low suspicion for ischemia. serial abd exams reassuring - have transitioned to eliquis - vascular advises outpt f/u dr. Delana Meyer   # HTN Here bp wnl in setting of the above - hold home amlodipine and lisin/hctz for now - cont home atenolol, atorvastatin   # Neuropathy - cont home gabapentin, nortriptyline   # Recent hospitalization for acute appendicitis, e coli bacteremia - s/p lap appy, abx  Procedures: None    Consultations: neurology  Discharge Exam: Vitals:   07/26/21 0831 07/26/21 1230  BP: 105/70 122/75  Pulse: 98 64  Resp: 18 18  Temp: 98.4 F (36.9 C) 98 F (36.7 C)  SpO2: 94% 97%    General exam: Appears calm and comfortable  Respiratory system: Clear to auscultation. Respiratory effort normal. Cardiovascular system: S1 & S2 heard, RRR. No JVD, murmurs, rubs, gallops or clicks. No pedal edema. Gastrointestinal system: Abdomen is nondistended, soft and nontender. No organomegaly or masses felt. Normal bowel sounds heard. Mild diffuse ttp. Healing lap port sites Central nervous system: alert. Slightly slurred speech. Mild left facial  droop. 5/5 upper and lower strength Extremities: Symmetric 5 x 5 power. Skin: No rashes, lesions or ulcers Psychiatry: Judgement and insight appear normal. Mood & affect appropriate.   Discharge Instructions   Discharge Instructions     Diet - low sodium heart healthy    Complete by: As directed    Increase activity slowly   Complete by: As directed       Allergies as of 07/26/2021   No Known Allergies      Medication List     STOP taking these medications    amLODipine 2.5 MG tablet Commonly known as: NORVASC   glipiZIDE XL 10 MG 24 hr tablet Generic drug: glipiZIDE   HYDROcodone-acetaminophen 5-325 MG tablet Commonly known as: Norco   ibuprofen 800 MG tablet Commonly known as: ADVIL   insulin aspart 100 UNIT/ML FlexPen Commonly known as: NOVOLOG   lisinopril-hydrochlorothiazide 20-12.5 MG tablet Commonly known as: ZESTORETIC       TAKE these medications    acetaminophen 325 MG tablet Commonly known as: Tylenol Take 2 tablets (650 mg total) by mouth every 8 (eight) hours as needed for mild pain.   apixaban 5 MG Tabs tablet Commonly known as: ELIQUIS 2 tabs twice a day for 5 days then 1 tab twice a day   atenolol 50 MG tablet Commonly known as: TENORMIN Take 50 mg by mouth daily.   atorvastatin 80 MG tablet Commonly known as: LIPITOR Take 1 tablet (80 mg total) by mouth daily.   docusate sodium 100 MG capsule Commonly known as: Colace Take 1 capsule (100 mg total) by mouth 2 (two) times daily as needed for up to 10 days for mild constipation.   fexofenadine 60 MG tablet Commonly known as: ALLEGRA Take 60 mg by mouth 2 (two) times daily as needed for allergies.   fluticasone 50 MCG/ACT nasal spray Commonly known as: FLONASE Place 2 sprays into both nostrils daily as needed for allergies.   gabapentin 300 MG capsule Commonly known as: NEURONTIN Take 300 mg by mouth 2 (two) times daily.   glucose blood test strip Use as instructed   insulin detemir 100 UNIT/ML injection Commonly known as: LEVEMIR Inject 0.3 mLs (30 Units total) into the skin at bedtime. What changed: how much to take   insulin lispro 100 UNIT/ML KwikPen Commonly known as: HUMALOG With meals   metroNIDAZOLE 500 MG tablet Commonly known  as: Flagyl Take 1 tablet (500 mg total) by mouth 3 (three) times daily for 10 days.   nortriptyline 10 MG capsule Commonly known as: PAMELOR Take 2 capsules by mouth at bedtime.   Ozempic (0.25 or 0.5 MG/DOSE) 2 MG/1.5ML Sopn Generic drug: Semaglutide(0.25 or 0.5MG /DOS) Inject into the skin.   pantoprazole 40 MG tablet Commonly known as: Protonix Take 1 tablet (40 mg total) by mouth daily.   sitaGLIPtin-metformin 50-1000 MG tablet Commonly known as: JANUMET Take 1 tablet by mouth 2 (two) times daily with a meal.       No Known Allergies  Follow-up Information     Kerri Perches, PA-C Follow up.   Specialty: Physician Assistant Contact information: [place] [address] [phone]         Schnier, Dolores Lory, MD. Schedule an appointment as soon as possible for a visit.   Specialties: Vascular Surgery, Cardiology, Radiology, Vascular Surgery Contact information: [place] [address] [place], [place], NP. Schedule an appointment as soon as  possible for a visit.   Specialty: Surgery Contact information: [place] [address] [phone]         Vladimir Crofts, MD Follow up.   Specialty: Neurology Contact information: [address] [facility] [place] [address] [phone]                  The results of significant diagnostics from this hospitalization (including imaging, microbiology, ancillary and laboratory) are listed below for reference.    Significant Diagnostic Studies: CT ANGIO HEAD NECK W WO CM  Result Date: 07/24/2021 CLINICAL DATA:  Right internal carotid artery occlusion EXAM: CT ANGIOGRAPHY HEAD AND NECK TECHNIQUE: Multidetector CT imaging of the head and neck was performed using the standard protocol during bolus administration of intravenous contrast. Multiplanar CT image reconstructions and MIPs were obtained to  evaluate the vascular anatomy. Carotid stenosis measurements (when applicable) are obtained utilizing NASCET criteria, using the distal internal carotid diameter as the denominator. CONTRAST:  72mL OMNIPAQUE IOHEXOL 350 MG/ML SOLN COMPARISON:  MRA head 07/24/2021 FINDINGS: CT HEAD FINDINGS Brain: The size and configuration of the ventricles and extra-axial CSF spaces are normal. Small early subacute infarct of the right frontal lobe, within the right MCA territory. Small amount of petechial blood. The brain parenchyma is normal. Skull: The visualized skull base, calvarium and extracranial soft tissues are normal. Sinuses/Orbits: No fluid levels or advanced mucosal thickening of the visualized paranasal sinuses. No mastoid or middle ear effusion. The orbits are normal. CTA NECK FINDINGS SKELETON: There is no bony spinal canal stenosis. No lytic or blastic lesion. OTHER NECK: Normal pharynx, larynx and major salivary glands. No cervical lymphadenopathy. Unremarkable thyroid gland. UPPER CHEST: No pneumothorax or pleural effusion. No nodules or masses. AORTIC ARCH: There is calcific atherosclerosis of the aortic arch. There is no aneurysm, dissection or hemodynamically significant stenosis of the visualized portion of the aorta. Conventional 3 vessel aortic branching pattern. The visualized proximal subclavian arteries are widely patent. RIGHT CAROTID SYSTEM: Right internal carotid artery is occluded at its origin. Moderate mixed density plaque at the carotid bifurcation. No reconstitution of the ICA in the neck. The ECA is patent. LEFT CAROTID SYSTEM: Mild calcification in the distal left common carotid artery. Normal carotid bifurcation. The left ICA is normal. VERTEBRAL ARTERIES: Left dominant configuration. Both origins are clearly patent. There is no dissection, occlusion or flow-limiting stenosis to the skull base (V1-V3 segments). CTA HEAD FINDINGS POSTERIOR CIRCULATION: --Vertebral arteries: Normal V4 segments.  --Inferior cerebellar arteries: Normal. --Basilar artery: Normal. --Superior cerebellar arteries: Normal. --Posterior cerebral arteries (PCA): Normal. ANTERIOR CIRCULATION: --Intracranial internal carotid arteries: Right ICA occluded. Normal left ICA. --Anterior cerebral arteries (ACA): Mild stenosis of the proximal right A2 segment. Otherwise normal. --Middle cerebral arteries (MCA): Right MCA is patent via collateral flow. Normal left MCA. VENOUS SINUSES: As permitted by contrast timing, patent. ANATOMIC VARIANTS: None Review of the MIP images confirms the above findings. IMPRESSION: 1. 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 2. Small early subacute infarct of the right frontal lobe, within the right MCA territory. Small amount of petechial blood. Aortic Atherosclerosis (ICD10-I70.0). Electronically Signed   By: Ulyses Jarred M.D.   On: 07/24/2021 21:26   DG Chest 2 View  Result Date: 07/14/2021 CLINICAL DATA:  Suspected Sepsis, abdominal pain, nausea, vomiting and fever EXAM: CHEST - 2 VIEW COMPARISON:  01/30/2020 chest radiograph. FINDINGS: Stable cardiomediastinal silhouette with normal heart size. No pneumothorax. No pleural effusion.  Lungs appear clear, with no acute consolidative airspace disease and no pulmonary edema. IMPRESSION: No active cardiopulmonary disease. Electronically Signed   By: Ilona Sorrel M.D.   On: 07/14/2021 12:54   CT HEAD WO CONTRAST (5MM)  Result Date: 07/25/2021 CLINICAL DATA:  Stroke follow-up. EXAM: CT HEAD WITHOUT CONTRAST TECHNIQUE: Contiguous axial images were obtained from the base of the skull through the vertex without intravenous contrast. COMPARISON:  Brain MRI and head CT 07/24/2021 FINDINGS: Brain: Scattered early subacute infarcts in the right frontal lobe within the right MCA territory, not significantly changed in CT appearance from yesterday. Associated petechial blood is unchanged. No additional or new infarct. No  subdural or extra-axial collection. No midline shift. No hydrocephalus. Mild periventricular chronic small vessel ischemia. Remote thalamic infarcts on MRI are not well seen by CT. Vascular: Skull base atherosclerosis. Vasculature assessed on CTA yesterday. Skull: No fracture or focal lesion. Sinuses/Orbits: Trace mucosal thickening of right side of sphenoid sinus. Mastoid air cells are clear. No acute orbital findings. Other: None. IMPRESSION: Scattered early subacute infarcts in the right frontal lobe within the right MCA territory, not significantly changed in CT appearance from yesterday. Associated petechial blood is unchanged. No additional or new infarct. Electronically Signed   By: Keith Rake M.D.   On: 07/25/2021 17:43   MR ANGIO HEAD WO CONTRAST  Result Date: 07/24/2021 CLINICAL DATA:  Acute neurologic deficit EXAM: MRA HEAD WITHOUT CONTRAST TECHNIQUE: Angiographic images of the Circle of Willis were acquired using MRA technique without intravenous contrast. COMPARISON:  No pertinent prior exam. FINDINGS: POSTERIOR CIRCULATION: --Vertebral arteries: Normal --Inferior cerebellar arteries: Normal. --Basilar artery: Normal. --Superior cerebellar arteries: Normal. --Posterior cerebral arteries: Normal. ANTERIOR CIRCULATION: --Intracranial internal carotid arteries: The visualized course of the right internal carotid artery is completely occluded. There is irregularity of the left ICA that may be due to atherosclerosis. --Anterior cerebral arteries (ACA): Moderate stenosis of the proximal right A2 segment. Otherwise normal. --Middle cerebral arteries (MCA): Normal. ANATOMIC VARIANTS: None IMPRESSION: 1. Complete occlusion of the right internal carotid artery. This could be more completely assessed with CTA of the neck. 2. Irregularity of the left ICA may be secondary to atherosclerosis. 3. Moderate stenosis of the proximal right ACA A2 segment. These results were called by telephone at the time of  interpretation on 07/24/2021 at 8:25 pm to provider [facility] , who verbally acknowledged these results. Electronically Signed   By: Ulyses Jarred M.D.   On: 07/24/2021 20:25   MR BRAIN WO CONTRAST  Result Date: 07/24/2021 CLINICAL DATA:  Sepsis and altered mental status EXAM: MRI HEAD WITHOUT CONTRAST TECHNIQUE: Multiplanar, multiecho pulse sequences of the brain and surrounding structures were obtained without intravenous contrast. COMPARISON:  None. FINDINGS: Brain: Multifocal acute ischemia in the right MCA territory. The areas of ischemia predominantly cortical/subcortical. Small amount of associated petechial blood. There are old bilateral thalamic infarcts. There is multifocal hyperintense T2-weighted signal within the white matter. Generalized volume loss without a clear lobar predilection. The midline structures are normal. Vascular: Major flow voids are preserved. Skull and upper cervical spine: Normal calvarium and skull base. Visualized upper cervical spine and soft tissues are normal. Sinuses/Orbits:No paranasal sinus fluid levels or advanced mucosal thickening. No mastoid or middle ear effusion. Normal orbits. IMPRESSION: 1. Multifocal acute ischemia in the right MCA territory, predominantly cortical/subcortical. Small amount of associated petechial blood. 2. 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 Electronically Signed   By: Cletus Gash.D.  On: 07/24/2021 19:34   CT ABDOMEN PELVIS W CONTRAST  Result Date: 07/22/2021 CLINICAL DATA:  Recent surgery. Abdominal pain. Radiologic records reports appendectomy 07/15/2021. EXAM: CT ABDOMEN AND PELVIS WITH CONTRAST TECHNIQUE: Multidetector CT imaging of the abdomen and pelvis was performed using the standard protocol following bolus administration of intravenous contrast. CONTRAST:  175mL OMNIPAQUE IOHEXOL 300 MG/ML  SOLN COMPARISON:  CT 07/14/2021 FINDINGS: Lower chest: No acute airspace disease or pleural  effusion. Mild wall thickening of the distal esophagus. Hepatobiliary: No focal liver abnormality is seen. Mild heterogeneous hepatic perfusion. The gallbladder is partially distended. No calcified gallstone or pericholecystic inflammation. Pancreas: No ductal dilatation or inflammation. Spleen: Normal in size without focal abnormality. Adrenals/Urinary Tract: Normal adrenal glands. No hydronephrosis or perinephric edema. Homogeneous renal enhancement with symmetric excretion on delayed phase imaging. No visualized renal stone or focal lesion. Slight lobulated bilateral renal contours. Urinary bladder is physiologically distended without wall thickening. Stomach/Bowel: Interval appendectomy. No abscess. Ingested material distends the stomach. Minimal distal esophageal wall thickening. No small bowel obstruction. Occasional fluid-filled loops of small bowel in the pelvis. Slight small bowel hyperemia but no definite wall thickening. No abnormal distension, ileus, or obstruction. Fluid/liquid stool in the ascending, transverse, and descending colon. No colonic inflammation or wall thickening. 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 Thrombus spans approximately 9.6 cm and extends from the mid abdomen to the portal splenic confluence. Mild adjacent peri venous stranding. There is no thrombus extension into the portal or splenic veins. The peripheral venous branches are patent. Multiple prominent central mesenteric lymph nodes with mild mesenteric edema. Aortic atherosclerosis. Reproductive: The uterus is deviated into the left pelvis, unchanged. Calcification in the right ovary. No suspicious adnexal mass. Other: Mesenteric edema with trace mesenteric free fluid. Trace perihepatic free fluid, non organized. No abscess or free air. There is no subcutaneous collection, expected postsurgical changes the abdominal wall. Musculoskeletal: Stable osseous structures. IMPRESSION: 1. Occlusive  thrombus in the superior mesenteric vein spanning approximately 9.6 cm, new from preoperative exam. Mild adjacent peri venous stranding. There is no thrombus extension into the portal or splenic veins. 2. Interval appendectomy. No abscess. 3. Mild small bowel hyperemia. No obstruction or ileus. Liquid stool/fluid in the colon. 4. Mild distal esophageal wall thickening, can be seen with reflux or esophagitis. 5. Mild heterogeneous hepatic perfusion, nonspecific. Aortic Atherosclerosis (ICD10-I70.0). These results were called by telephone at the time of interpretation on 07/22/2021 at 11:07 pm to provider [facility] , who verbally acknowledged these results. Electronically Signed   By: Keith Rake M.D.   On: 07/22/2021 23:08   CT ABDOMEN PELVIS W CONTRAST  Result Date: 07/14/2021 CLINICAL DATA:  Abdominal pain, acute, nonlocalized EXAM: CT ABDOMEN AND PELVIS WITH CONTRAST TECHNIQUE: Multidetector CT imaging of the abdomen and pelvis was performed using the standard protocol following bolus administration of intravenous contrast. CONTRAST:  149mL OMNIPAQUE IOHEXOL 300 MG/ML  SOLN COMPARISON:  None. FINDINGS: Motion artifact is present. Lower chest: No acute abnormality. Hepatobiliary: No focal liver abnormality is seen. No gallstones, gallbladder wall thickening, or biliary dilatation. Pancreas: Unremarkable. Spleen: Unremarkable. Adrenals/Urinary Tract: Adrenals, kidneys, and partially distended bladder are unremarkable. Stomach/Bowel: Stomach is within normal limits. Bowel is normal in caliber. Fluid-filled, dilated appendix with surrounding fat infiltration. Vascular/Lymphatic: Atherosclerosis.  No enlarged lymph nodes. Reproductive: Uterus and bilateral adnexa are unremarkable. Other: No free air.  No abscess. Musculoskeletal: Lumbar spine degenerative changes. IMPRESSION: Acute appendicitis without complication. These results were called by telephone at the time of  interpretation on 07/14/2021 at 2:07  pm to provider CARI TRIPLETT , who verbally acknowledged these results. Electronically Signed   By: Macy Mis M.D.   On: 07/14/2021 14:15   ECHOCARDIOGRAM COMPLETE BUBBLE STUDY  Result Date: 07/25/2021    ECHOCARDIOGRAM REPORT   Patient Name:   MARGUERITE EIGHMY Date of Exam: 07/25/2021 Medical Rec #:  [id number]    Height:       61.0 in Accession #:    [id number]   Weight:       145.1 lb Date of Birth:  23-Nov-1965    BSA:          1.648 m Patient Age:    22 years     BP:           143/81 mmHg Patient Gender: F            HR:           89 bpm. Exam Location:  [facility] Procedure: 2D Echo and Saline Contrast Bubble Study Indications:     Stroke I63.9  History:         Patient has prior history of Echocardiogram examinations, most                  recent 01/31/2020.  Sonographer:     Kathlen Brunswick RDCS Referring Phys:  [place] Diagnosing Phys: Ida Rogue MD IMPRESSIONS  1. Left ventricular ejection fraction, by estimation, is 60 to 65%. The left ventricle has normal function. The left ventricle has no regional wall motion abnormalities. Left ventricular diastolic parameters are consistent with Grade I diastolic dysfunction (impaired relaxation).  2. Right ventricular systolic function is normal. The right ventricular size is normal. Tricuspid regurgitation signal is inadequate for assessing PA pressure.  3. The mitral valve is normal in structure. Mild mitral valve regurgitation. No evidence of mitral stenosis.  4. The aortic valve is normal in structure. Aortic valve regurgitation is not visualized. Aortic valve sclerosis is present, with no evidence of aortic valve stenosis.  5. The inferior vena cava is normal in size with greater than 50% respiratory variability, suggesting right atrial pressure of 3 mmHg.  6. Agitated saline contrast bubble study was negative, with no evidence of any interatrial shunt. FINDINGS  Left Ventricle: Left ventricular ejection fraction, by estimation, is 60 to 65%. The  left ventricle has normal function. The left ventricle has no regional wall motion abnormalities. The left ventricular internal cavity size was normal in size. There is  no left ventricular hypertrophy. Left ventricular diastolic parameters are consistent with Grade I diastolic dysfunction (impaired relaxation). Right Ventricle: The right ventricular size is normal. No increase in right ventricular wall thickness. Right ventricular systolic function is normal. Tricuspid regurgitation signal is inadequate for assessing PA pressure. Left Atrium: Left atrial size was normal in size. Right Atrium: Right atrial size was normal in size. Pericardium: There is no evidence of pericardial effusion. Mitral Valve: The mitral valve is normal in structure. Mild mitral valve regurgitation. No evidence of mitral valve stenosis. Tricuspid Valve: The tricuspid valve is normal in structure. Tricuspid valve regurgitation is not demonstrated. No evidence of tricuspid stenosis. Aortic Valve: The aortic valve is normal in structure. Aortic valve regurgitation is not visualized. Aortic valve sclerosis is present, with no evidence of aortic valve stenosis. Aortic valve peak gradient measures 5.4 mmHg. Pulmonic Valve: The pulmonic valve was normal in structure. Pulmonic valve regurgitation is not visualized. No evidence of pulmonic stenosis. Aorta: The aortic  root is normal in size and structure. Venous: The inferior vena cava is normal in size with greater than 50% respiratory variability, suggesting right atrial pressure of 3 mmHg. IAS/Shunts: No atrial level shunt detected by color flow Doppler. Agitated saline contrast was given intravenously to evaluate for intracardiac shunting. Agitated saline contrast bubble study was negative, with no evidence of any interatrial shunt.  LEFT VENTRICLE PLAX 2D LVIDd:         3.80 cm     Diastology LVIDs:         2.70 cm     LV e' medial:    6.85 cm/s LV PW:         1.10 cm     LV E/e' medial:  12.8  LV IVS:        1.20 cm     LV e' lateral:   6.96 cm/s LVOT diam:     2.00 cm     LV E/e' lateral: 12.6 LV SV:         57 LV SV Index:   35 LVOT Area:     3.14 cm  LV Volumes (MOD) LV vol d, MOD A2C: 57.9 ml LV vol d, MOD A4C: 77.1 ml LV vol s, MOD A2C: 22.3 ml LV vol s, MOD A4C: 33.3 ml LV SV MOD A2C:     35.6 ml LV SV MOD A4C:     77.1 ml LV SV MOD BP:      40.2 ml RIGHT VENTRICLE RV Basal diam:  2.70 cm RV S prime:     13.40 cm/s TAPSE (M-mode): 1.6 cm LEFT ATRIUM             Index        RIGHT ATRIUM          Index LA diam:        3.40 cm 2.06 cm/m   RA Area:     7.84 cm LA Vol (A2C):   28.0 ml 16.99 ml/m  RA Volume:   15.00 ml 9.10 ml/m LA Vol (A4C):   18.5 ml 11.23 ml/m LA Biplane Vol: 23.9 ml 14.50 ml/m  AORTIC VALVE                 PULMONIC VALVE AV Area (Vmax): 2.32 cm     PV Vmax:       0.88 m/s AV Vmax:        116.00 cm/s  PV Peak grad:  3.1 mmHg AV Peak Grad:   5.4 mmHg LVOT Vmax:      85.50 cm/s LVOT Vmean:     60.500 cm/s LVOT VTI:       0.181 m  AORTA Ao Root diam: 2.50 cm Ao Asc diam:  2.80 cm MITRAL VALVE MV Area (PHT): 7.09 cm    SHUNTS MV Decel Time: 107 msec    Systemic VTI:  0.18 m MV E velocity: 87.80 cm/s  Systemic Diam: 2.00 cm MV A velocity: 81.40 cm/s MV E/A ratio:  1.08 Ida Rogue MD Electronically signed by Ida Rogue MD Signature Date/Time: 07/25/2021/3:44:40 PM    Final     Microbiology: Recent Results (from the past 240 hour(s))  Resp Panel by RT-PCR (Flu A&B, Covid) Nasopharyngeal Swab     Status: None   Collection Time: 07/22/21 11:25 PM   Specimen: Nasopharyngeal Swab; Nasopharyngeal(NP) swabs in vial transport medium  Result Value Ref Range Status   SARS Coronavirus 2 by RT PCR NEGATIVE NEGATIVE Final  Comment: (NOTE) SARS-CoV-2 target nucleic acids are NOT DETECTED.  The SARS-CoV-2 RNA is generally detectable in upper respiratory specimens during the acute phase of infection. The lowest concentration of SARS-CoV-2 viral copies this assay can detect  is 138 copies/mL. A negative result does not preclude SARS-Cov-2 infection and should not be used as the sole basis for treatment or other patient management decisions. A negative result may occur with  improper specimen collection/handling, submission of specimen other than nasopharyngeal swab, presence of viral mutation(s) within the areas targeted by this assay, and inadequate number of viral copies(<138 copies/mL). A negative result must be combined with clinical observations, patient history, and epidemiological information. The expected result is Negative.  Fact Sheet for Patients:  EntrepreneurPulse.com.au  Fact Sheet for Healthcare Providers:  IncredibleEmployment.be  This test is no t yet approved or cleared by the [place] FDA and  has been authorized for detection and/or diagnosis of SARS-CoV-2 by FDA under an Emergency Use Authorization (EUA). This EUA will remain  in effect (meaning this test can be used) for the duration of the COVID-19 declaration under Section 564(b)(1) of the Act, 21 U.S.C.section 360bbb-3(b)(1), unless the authorization is terminated  or revoked sooner.       Influenza A by PCR NEGATIVE NEGATIVE Final   Influenza B by PCR NEGATIVE NEGATIVE Final    Comment: (NOTE) The Xpert Xpress SARS-CoV-2/FLU/RSV plus assay is intended as an aid in the diagnosis of influenza from Nasopharyngeal swab specimens and should not be used as a sole basis for treatment. Nasal washings and aspirates are unacceptable for Xpert Xpress SARS-CoV-2/FLU/RSV testing.  Fact Sheet for Patients: EntrepreneurPulse.com.au  Fact Sheet for Healthcare Providers: IncredibleEmployment.be  This test is not yet approved or cleared by the [place] FDA and has been authorized for detection and/or diagnosis of SARS-CoV-2 by FDA under an Emergency Use Authorization (EUA). This EUA will remain in effect  (meaning this test can be used) for the duration of the COVID-19 declaration under Section 564(b)(1) of the Act, 21 U.S.C. section 360bbb-3(b)(1), unless the authorization is terminated or revoked.  Performed at [facility], [address] [place], [place] [address]      Labs: Basic Metabolic Panel: Recent Labs  Lab 07/22/21 2112 07/23/21 [id number] 07/24/21 0434 07/25/21 0409 07/26/21 0504  NA 132* 138 137 135 131*  K 3.2* 4.0 4.8 4.2 4.4  CL 100 103 107 105 99  CO2 22 26 24 23 24   GLUCOSE 219* 191* 239* 153* 168*  BUN 19 13 11 10 12   CREATININE 0.68 0.65 0.54 0.66 0.75  CALCIUM 8.2* 8.4* 8.4* 8.5* 8.6*  MG  --   --   --  1.3* 1.8  PHOS  --   --   --  3.9  --    Liver Function Tests: Recent Labs  Lab 07/25/21 0409  AST 17  ALT 15  ALKPHOS 124  BILITOT 0.5  PROT 7.0  ALBUMIN 2.5*   No results for input(s): LIPASE, AMYLASE in the last 168 hours. No results for input(s): AMMONIA in the last 168 hours. CBC: Recent Labs  Lab 07/22/21 2112 07/23/21 0633 07/24/21 0434  WBC 12.3* 12.2* 9.6  NEUTROABS 10.5*  --   --   HGB 9.9* 9.4* 9.7*  HCT 30.1* 29.1* 29.8*  MCV 86.5 86.1 84.2  PLT 410* 538* 548*   Cardiac Enzymes: No results for input(s): CKTOTAL, CKMB, CKMBINDEX, TROPONINI in the last 168 hours. BNP: BNP (last 3 results) Recent Labs  07/14/21 1219  BNP 204.4*    ProBNP (last 3 results) No results for input(s): PROBNP in the last 8760 hours.  CBG: Recent Labs  Lab 07/25/21 0810 07/25/21 1213 07/25/21 1647 07/25/21 2021 07/26/21 0831  GLUCAP 146* 190* 161* 236* 150*       Signed:  Desma Maxim MD.  [facility] 07/26/2021, 2:39 PM

## 2021-08-05 NOTE — Progress Notes (Signed)
MRN : [id number]  Nicole Villegas is a 56 y.o. (1966/04/27) female who presents with chief complaint of follow up for blood clot.  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  Mesenteric vein thrombus was identified at [facility] by CT scan.  The initial symptoms were pain in the abdomen.  Of note, the patient had recently undergone appendectomy for appendicitis.  Comparison of the CT scans before surgery and after demonstrates the thrombus was new.  No evidence of abscess in the right lower quadrant.  She was admitted to the hospital and initiated on heparin drip and then transition to oral anticoagulation.  No SOB or pleuritic chest pains.  No cough or hemoptysis.  No blood per rectum or blood in any sputum.  No excessive bruising per the patient.    No outpatient medications have been marked as taking for the 08/06/21 encounter (Appointment) with Delana Meyer, Dolores Lory, MD.    Past Medical History:  Diagnosis Date   Diabetes mellitus without complication ([place])    Hyperlipidemia    Hypertension     Past Surgical History:  Procedure Laterality Date   APPENDECTOMY     CESAREAN SECTION     x3   XI ROBOTIC LAPAROSCOPIC ASSISTED APPENDECTOMY N/A 07/15/2021   Procedure: XI ROBOTIC LAPAROSCOPIC ASSISTED APPENDECTOMY;  Surgeon: Benjamine Sprague, DO;  Location: [facility] ORS;  Service: General;  Laterality: N/A;    Social History Social History   Tobacco Use   Smoking status: Never   Smokeless tobacco: Never  Vaping Use   Vaping Use: Never used  Substance Use Topics   Alcohol use: Not Currently   Drug use: Not Currently    Family History Family History  Problem Relation Age of Onset   Diabetes Mellitus I Maternal Aunt    Hypertension Maternal Aunt     No Known Allergies   REVIEW OF SYSTEMS (Negative unless checked)  Constitutional: [] Weight loss  [] Fever  [] Chills Cardiac: [] Chest pain   [] Chest pressure    [] Palpitations   [] Shortness of breath when laying flat   [] Shortness of breath with exertion. Vascular:  [] Pain in legs with walking   [] Pain in legs at rest  [] History of DVT   [] Phlebitis   [x] Swelling in legs   [] Varicose veins   [] Non-healing ulcers Pulmonary:   [] Uses home oxygen   [] Productive cough   [] Hemoptysis   [] Wheeze  [] COPD   [] Asthma Neurologic:  [] Dizziness   [] Seizures   [] History of stroke   [] History of TIA  [] Aphasia   [] Vissual changes   [] Weakness or numbness in arm   [] Weakness or numbness in leg Musculoskeletal:   [] Joint swelling   [] Joint pain   [] Low back pain Hematologic:  [] Easy bruising  [] Easy bleeding   [] Hypercoagulable state   [] Anemic Gastrointestinal:  [] Diarrhea   [] Vomiting  [x] Gastroesophageal reflux/heartburn   [] Difficulty swallowing. Genitourinary:  [] Chronic kidney disease   [] Difficult urination  [] Frequent urination   [] Blood in urine Skin:  [] Rashes   [] Ulcers  Psychological:  [] History of anxiety   []  History of major depression.  Physical Examination  There were no vitals filed for this visit. There is no height or weight on file to calculate BMI. Gen: WD/WN, NAD Head: [place]/AT, No temporalis wasting.  Ear/Nose/Throat: Hearing grossly intact, nares w/o erythema or drainage, pinna without lesions Eyes: PER, EOMI, sclera nonicteric.  Neck: Supple, no gross masses.  No JVD.  Pulmonary:  Good air movement, no audible wheezing, no use of accessory muscles.  Cardiac: RRR, precordium not hyperdynamic. Vascular:   Vessel Right Left  Radial Palpable Palpable  Gastrointestinal: soft, non-distended. No guarding/no peritoneal signs.  Musculoskeletal: M/S 5/5 throughout.  No deformity.  Neurologic: CN 2-12 intact. Pain and light touch intact in extremities.  Symmetrical.  Speech is fluent. Motor exam as listed above. Psychiatric: Judgment intact, Mood & affect appropriate for pt's clinical situation. Dermatologic: Venous rashes no ulcers noted.  No  changes consistent with cellulitis. Lymph : No lichenification or skin changes of chronic lymphedema.  CBC Lab Results  Component Value Date   WBC 9.6 07/24/2021   HGB 9.7 (L) 07/24/2021   HCT 29.8 (L) 07/24/2021   MCV 84.2 07/24/2021   PLT 548 (H) 07/24/2021    BMET    Component Value Date/Time   NA 131 (L) 07/26/2021 0504   K 4.4 07/26/2021 0504   CL 99 07/26/2021 0504   CO2 24 07/26/2021 0504   GLUCOSE 168 (H) 07/26/2021 0504   BUN 12 07/26/2021 0504   CREATININE 0.75 07/26/2021 0504   CALCIUM 8.6 (L) 07/26/2021 0504   GFRNONAA >60 07/26/2021 0504   GFRAA 38 (L) 01/30/2020 2138   Estimated Creatinine Clearance: 69 mL/min (by C-G formula based on SCr of 0.75 mg/dL).  COAG Lab Results  Component Value Date   INR 1.1 07/22/2021   INR 1.1 07/14/2021    Radiology CT ANGIO HEAD NECK W WO CM  Result Date: 07/24/2021 CLINICAL DATA:  Right internal carotid artery occlusion EXAM: CT ANGIOGRAPHY HEAD AND NECK TECHNIQUE: Multidetector CT imaging of the head and neck was performed using the standard protocol during bolus administration of intravenous contrast. Multiplanar CT image reconstructions and MIPs were obtained to evaluate the vascular anatomy. Carotid stenosis measurements (when applicable) are obtained utilizing NASCET criteria, using the distal internal carotid diameter as the denominator. CONTRAST:  59mL OMNIPAQUE IOHEXOL 350 MG/ML SOLN COMPARISON:  MRA head 07/24/2021 FINDINGS: CT HEAD FINDINGS Brain: The size and configuration of the ventricles and extra-axial CSF spaces are normal. Small early subacute infarct of the right frontal lobe, within the right MCA territory. Small amount of petechial blood. The brain parenchyma is normal. Skull: The visualized skull base, calvarium and extracranial soft tissues are normal. Sinuses/Orbits: No fluid levels or advanced mucosal thickening of the visualized paranasal sinuses. No mastoid or middle ear effusion. The orbits are normal.  CTA NECK FINDINGS SKELETON: There is no bony spinal canal stenosis. No lytic or blastic lesion. OTHER NECK: Normal pharynx, larynx and major salivary glands. No cervical lymphadenopathy. Unremarkable thyroid gland. UPPER CHEST: No pneumothorax or pleural effusion. No nodules or masses. AORTIC ARCH: There is calcific atherosclerosis of the aortic arch. There is no aneurysm, dissection or hemodynamically significant stenosis of the visualized portion of the aorta. Conventional 3 vessel aortic branching pattern. The visualized proximal subclavian arteries are widely patent. RIGHT CAROTID SYSTEM: Right internal carotid artery is occluded at its origin. Moderate mixed density plaque at the carotid bifurcation. No reconstitution of the ICA in the neck. The ECA is patent. LEFT CAROTID SYSTEM: Mild calcification in the distal left common carotid artery. Normal carotid bifurcation. The left ICA is normal. VERTEBRAL ARTERIES: Left dominant configuration. Both origins are clearly patent. There is no dissection, occlusion or flow-limiting stenosis to the skull base (V1-V3 segments). CTA HEAD FINDINGS POSTERIOR CIRCULATION: --Vertebral arteries: Normal V4 segments. --Inferior cerebellar arteries: Normal. --Basilar artery: Normal. --Superior cerebellar arteries: Normal. --Posterior cerebral  arteries (PCA): Normal. ANTERIOR CIRCULATION: --Intracranial internal carotid arteries: Right ICA occluded. Normal left ICA. --Anterior cerebral arteries (ACA): Mild stenosis of the proximal right A2 segment. Otherwise normal. --Middle cerebral arteries (MCA): Right MCA is patent via collateral flow. Normal left MCA. VENOUS SINUSES: As permitted by contrast timing, patent. ANATOMIC VARIANTS: None Review of the MIP images confirms the above findings. IMPRESSION: 1. 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 2. Small early subacute infarct of the right frontal lobe, within the right MCA territory. Small  amount of petechial blood. Aortic Atherosclerosis (ICD10-I70.0). Electronically Signed   By: Ulyses Jarred M.D.   On: 07/24/2021 21:26   DG Chest 2 View  Result Date: 07/14/2021 CLINICAL DATA:  Suspected Sepsis, abdominal pain, nausea, vomiting and fever EXAM: CHEST - 2 VIEW COMPARISON:  01/30/2020 chest radiograph. FINDINGS: Stable cardiomediastinal silhouette with normal heart size. No pneumothorax. No pleural effusion. Lungs appear clear, with no acute consolidative airspace disease and no pulmonary edema. IMPRESSION: No active cardiopulmonary disease. Electronically Signed   By: Ilona Sorrel M.D.   On: 07/14/2021 12:54   CT HEAD WO CONTRAST (5MM)  Result Date: 07/25/2021 CLINICAL DATA:  Stroke follow-up. EXAM: CT HEAD WITHOUT CONTRAST TECHNIQUE: Contiguous axial images were obtained from the base of the skull through the vertex without intravenous contrast. COMPARISON:  Brain MRI and head CT 07/24/2021 FINDINGS: Brain: Scattered early subacute infarcts in the right frontal lobe within the right MCA territory, not significantly changed in CT appearance from yesterday. Associated petechial blood is unchanged. No additional or new infarct. No subdural or extra-axial collection. No midline shift. No hydrocephalus. Mild periventricular chronic small vessel ischemia. Remote thalamic infarcts on MRI are not well seen by CT. Vascular: Skull base atherosclerosis. Vasculature assessed on CTA yesterday. Skull: No fracture or focal lesion. Sinuses/Orbits: Trace mucosal thickening of right side of sphenoid sinus. Mastoid air cells are clear. No acute orbital findings. Other: None. IMPRESSION: Scattered early subacute infarcts in the right frontal lobe within the right MCA territory, not significantly changed in CT appearance from yesterday. Associated petechial blood is unchanged. No additional or new infarct. Electronically Signed   By: Keith Rake M.D.   On: 07/25/2021 17:43   MR ANGIO HEAD WO  CONTRAST  Result Date: 07/24/2021 CLINICAL DATA:  Acute neurologic deficit EXAM: MRA HEAD WITHOUT CONTRAST TECHNIQUE: Angiographic images of the Circle of Willis were acquired using MRA technique without intravenous contrast. COMPARISON:  No pertinent prior exam. FINDINGS: POSTERIOR CIRCULATION: --Vertebral arteries: Normal --Inferior cerebellar arteries: Normal. --Basilar artery: Normal. --Superior cerebellar arteries: Normal. --Posterior cerebral arteries: Normal. ANTERIOR CIRCULATION: --Intracranial internal carotid arteries: The visualized course of the right internal carotid artery is completely occluded. There is irregularity of the left ICA that may be due to atherosclerosis. --Anterior cerebral arteries (ACA): Moderate stenosis of the proximal right A2 segment. Otherwise normal. --Middle cerebral arteries (MCA): Normal. ANATOMIC VARIANTS: None IMPRESSION: 1. Complete occlusion of the right internal carotid artery. This could be more completely assessed with CTA of the neck. 2. Irregularity of the left ICA may be secondary to atherosclerosis. 3. Moderate stenosis of the proximal right ACA A2 segment. These results were called by telephone at the time of interpretation on 07/24/2021 at 8:25 pm to provider [facility] , who verbally acknowledged these results. Electronically Signed   By: Ulyses Jarred M.D.   On: 07/24/2021 20:25   MR BRAIN WO CONTRAST  Result Date: 07/24/2021 CLINICAL DATA:  Sepsis and altered mental  status EXAM: MRI HEAD WITHOUT CONTRAST TECHNIQUE: Multiplanar, multiecho pulse sequences of the brain and surrounding structures were obtained without intravenous contrast. COMPARISON:  None. FINDINGS: Brain: Multifocal acute ischemia in the right MCA territory. The areas of ischemia predominantly cortical/subcortical. Small amount of associated petechial blood. There are old bilateral thalamic infarcts. There is multifocal hyperintense T2-weighted signal within the white matter. Generalized  volume loss without a clear lobar predilection. The midline structures are normal. Vascular: Major flow voids are preserved. Skull and upper cervical spine: Normal calvarium and skull base. Visualized upper cervical spine and soft tissues are normal. Sinuses/Orbits:No paranasal sinus fluid levels or advanced mucosal thickening. No mastoid or middle ear effusion. Normal orbits. IMPRESSION: 1. Multifocal acute ischemia in the right MCA territory, predominantly cortical/subcortical. Small amount of associated petechial blood. 2. 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 Electronically Signed   By: Deatra Robinson M.D.   On: 07/24/2021 19:34   CT ABDOMEN PELVIS W CONTRAST  Result Date: 07/22/2021 CLINICAL DATA:  Recent surgery. Abdominal pain. Radiologic records reports appendectomy 07/15/2021. EXAM: CT ABDOMEN AND PELVIS WITH CONTRAST TECHNIQUE: Multidetector CT imaging of the abdomen and pelvis was performed using the standard protocol following bolus administration of intravenous contrast. CONTRAST:  OMNIPAQUE IOHEXOL 300 MG/ML  SOLN COMPARISON:  CT 07/14/2021 FINDINGS: Lower chest: No acute airspace disease or pleural effusion. Mild wall thickening of the distal esophagus. Hepatobiliary: No focal liver abnormality is seen. Mild heterogeneous hepatic perfusion. The gallbladder is partially distended. No calcified gallstone or pericholecystic inflammation. Pancreas: No ductal dilatation or inflammation. Spleen: Normal in size without focal abnormality. Adrenals/Urinary Tract: Normal adrenal glands. No hydronephrosis or perinephric edema. Homogeneous renal enhancement with symmetric excretion on delayed phase imaging. No visualized renal stone or focal lesion. Slight lobulated bilateral renal contours. Urinary bladder is physiologically distended without wall thickening. Stomach/Bowel: Interval appendectomy. No abscess. Ingested material distends the stomach. Minimal distal  esophageal wall thickening. No small bowel obstruction. Occasional fluid-filled loops of small bowel in the pelvis. Slight small bowel hyperemia but no definite wall thickening. No abnormal distension, ileus, or obstruction. Fluid/liquid stool in the ascending, transverse, and descending colon. No colonic inflammation or wall thickening. 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 Thrombus spans approximately 9.6 cm and extends from the mid abdomen to the portal splenic confluence. Mild adjacent peri venous stranding. There is no thrombus extension into the portal or splenic veins. The peripheral venous branches are patent. Multiple prominent central mesenteric lymph nodes with mild mesenteric edema. Aortic atherosclerosis. Reproductive: The uterus is deviated into the left pelvis, unchanged. Calcification in the right ovary. No suspicious adnexal mass. Other: Mesenteric edema with trace mesenteric free fluid. Trace perihepatic free fluid, non organized. No abscess or free air. There is no subcutaneous collection, expected postsurgical changes the abdominal wall. Musculoskeletal: Stable osseous structures. IMPRESSION: 1. 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 Mild adjacent peri venous stranding. There is no thrombus extension into the portal or splenic veins. 2. Interval appendectomy. No abscess. 3. Mild small bowel hyperemia. No obstruction or ileus. Liquid stool/fluid in the colon. 4. Mild distal esophageal wall thickening, can be seen with reflux or esophagitis. 5. Mild heterogeneous hepatic perfusion, nonspecific. Aortic Atherosclerosis (ICD10-I70.0). These results were called by telephone at the time of interpretation on 07/22/2021 at 11:07 pm to provider [facility] , who verbally acknowledged these results. Electronically Signed   By: Ivette Loyal.D.  On: 07/22/2021 23:08   CT ABDOMEN PELVIS W  CONTRAST  Result Date: 07/14/2021 CLINICAL DATA:  Abdominal pain, acute, nonlocalized EXAM: CT ABDOMEN AND PELVIS WITH CONTRAST TECHNIQUE: Multidetector CT imaging of the abdomen and pelvis was performed using the standard protocol following bolus administration of intravenous contrast. CONTRAST:  158mL OMNIPAQUE IOHEXOL 300 MG/ML  SOLN COMPARISON:  None. FINDINGS: Motion artifact is present. Lower chest: No acute abnormality. Hepatobiliary: No focal liver abnormality is seen. No gallstones, gallbladder wall thickening, or biliary dilatation. Pancreas: Unremarkable. Spleen: Unremarkable. Adrenals/Urinary Tract: Adrenals, kidneys, and partially distended bladder are unremarkable. Stomach/Bowel: Stomach is within normal limits. Bowel is normal in caliber. Fluid-filled, dilated appendix with surrounding fat infiltration. Vascular/Lymphatic: Atherosclerosis.  No enlarged lymph nodes. Reproductive: Uterus and bilateral adnexa are unremarkable. Other: No free air.  No abscess. Musculoskeletal: Lumbar spine degenerative changes. IMPRESSION: Acute appendicitis without complication. These results were called by telephone at the time of interpretation on 07/14/2021 at 2:07 pm to provider CARI TRIPLETT , who verbally acknowledged these results. Electronically Signed   By: Macy Mis M.D.   On: 07/14/2021 14:15   ECHOCARDIOGRAM COMPLETE BUBBLE STUDY  Result Date: 07/25/2021    ECHOCARDIOGRAM REPORT   Patient Name:   Nicole Villegas Date of Exam: 07/25/2021 Medical Rec #:  [id number]    Height:       61.0 in Accession #:    [id number]   Weight:       145.1 lb Date of Birth:  1965/12/09    BSA:          1.648 m Patient Age:    84 years     BP:           143/81 mmHg Patient Gender: F            HR:           89 bpm. Exam Location:  [facility] Procedure: 2D Echo and Saline Contrast Bubble Study Indications:     Stroke I63.9  History:         Patient has prior history of Echocardiogram examinations, most                  recent  01/31/2020.  Sonographer:     Kathlen Brunswick RDCS Referring Phys:  [place] Diagnosing Phys: Ida Rogue MD IMPRESSIONS  1. Left ventricular ejection fraction, by estimation, is 60 to 65%. The left ventricle has normal function. The left ventricle has no regional wall motion abnormalities. Left ventricular diastolic parameters are consistent with Grade I diastolic dysfunction (impaired relaxation).  2. Right ventricular systolic function is normal. The right ventricular size is normal. Tricuspid regurgitation signal is inadequate for assessing PA pressure.  3. The mitral valve is normal in structure. Mild mitral valve regurgitation. No evidence of mitral stenosis.  4. The aortic valve is normal in structure. Aortic valve regurgitation is not visualized. Aortic valve sclerosis is present, with no evidence of aortic valve stenosis.  5. The inferior vena cava is normal in size with greater than 50% respiratory variability, suggesting right atrial pressure of 3 mmHg.  6. Agitated saline contrast bubble study was negative, with no evidence of any interatrial shunt. FINDINGS  Left Ventricle: Left ventricular ejection fraction, by estimation, is 60 to 65%. The left ventricle has normal function. The left ventricle has no regional wall motion abnormalities. The left ventricular internal cavity size was normal in size. There is  no left ventricular hypertrophy. Left ventricular diastolic parameters are consistent  with Grade I diastolic dysfunction (impaired relaxation). Right Ventricle: The right ventricular size is normal. No increase in right ventricular wall thickness. Right ventricular systolic function is normal. Tricuspid regurgitation signal is inadequate for assessing PA pressure. Left Atrium: Left atrial size was normal in size. Right Atrium: Right atrial size was normal in size. Pericardium: There is no evidence of pericardial effusion. Mitral Valve: The mitral valve is normal in structure. Mild  mitral valve regurgitation. No evidence of mitral valve stenosis. Tricuspid Valve: The tricuspid valve is normal in structure. Tricuspid valve regurgitation is not demonstrated. No evidence of tricuspid stenosis. Aortic Valve: The aortic valve is normal in structure. Aortic valve regurgitation is not visualized. Aortic valve sclerosis is present, with no evidence of aortic valve stenosis. Aortic valve peak gradient measures 5.4 mmHg. Pulmonic Valve: The pulmonic valve was normal in structure. Pulmonic valve regurgitation is not visualized. No evidence of pulmonic stenosis. Aorta: The aortic root is normal in size and structure. Venous: The inferior vena cava is normal in size with greater than 50% respiratory variability, suggesting right atrial pressure of 3 mmHg. IAS/Shunts: No atrial level shunt detected by color flow Doppler. Agitated saline contrast was given intravenously to evaluate for intracardiac shunting. Agitated saline contrast bubble study was negative, with no evidence of any interatrial shunt.  LEFT VENTRICLE PLAX 2D LVIDd:         3.80 cm     Diastology LVIDs:         2.70 cm     LV e' medial:    6.85 cm/s LV PW:         1.10 cm     LV E/e' medial:  12.8 LV IVS:        1.20 cm     LV e' lateral:   6.96 cm/s LVOT diam:     2.00 cm     LV E/e' lateral: 12.6 LV SV:         57 LV SV Index:   35 LVOT Area:     3.14 cm  LV Volumes (MOD) LV vol d, MOD A2C: 57.9 ml LV vol d, MOD A4C: 77.1 ml LV vol s, MOD A2C: 22.3 ml LV vol s, MOD A4C: 33.3 ml LV SV MOD A2C:     35.6 ml LV SV MOD A4C:     77.1 ml LV SV MOD BP:      40.2 ml RIGHT VENTRICLE RV Basal diam:  2.70 cm RV S prime:     13.40 cm/s TAPSE (M-mode): 1.6 cm LEFT ATRIUM             Index        RIGHT ATRIUM          Index LA diam:        3.40 cm 2.06 cm/m   RA Area:     7.84 cm LA Vol (A2C):   28.0 ml 16.99 ml/m  RA Volume:   15.00 ml 9.10 ml/m LA Vol (A4C):   18.5 ml 11.23 ml/m LA Biplane Vol: 23.9 ml 14.50 ml/m  AORTIC VALVE                  PULMONIC VALVE AV Area (Vmax): 2.32 cm     PV Vmax:       0.88 m/s AV Vmax:        116.00 cm/s  PV Peak grad:  3.1 mmHg AV Peak Grad:   5.4 mmHg LVOT Vmax:  85.50 cm/s LVOT Vmean:     60.500 cm/s LVOT VTI:       0.181 m  AORTA Ao Root diam: 2.50 cm Ao Asc diam:  2.80 cm MITRAL VALVE MV Area (PHT): 7.09 cm    SHUNTS MV Decel Time: 107 msec    Systemic VTI:  0.18 m MV E velocity: 87.80 cm/s  Systemic Diam: 2.00 cm MV A velocity: 81.40 cm/s MV E/A ratio:  1.08 Ida Rogue MD Electronically signed by Ida Rogue MD Signature Date/Time: 07/25/2021/3:44:40 PM    Final      Assessment/Plan 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  She is now on Eliquis.  A $10 co-pay card and another 2 weeks of Eliquis were given.  Patient will continue Eliquis for 6 months.  She will follow-up with me at that time with a duplex ultrasound.  At the present time she is now tolerating diet and is no longer having intermittent abdominal pain indicating she is improving and doing well.  No further invasive treatments at this time.  2. Primary hypertension Continue antihypertensive medications as already ordered, these medications have been reviewed and there are no changes at this time.   3. Gastroesophageal reflux disease without esophagitis Continue PPI as already ordered, this medication has been reviewed and there are no changes at this time.  Avoidence of caffeine and alcohol  Moderate elevation of the head of the bed    4. Diabetes mellitus without complication ([place]) Continue hypoglycemic medications as already ordered, these medications have been reviewed and there are no changes at this time.  Hgb A1C to be monitored as already arranged by primary service     Hortencia Pilar, MD  08/05/2021 3:54 PM

## 2021-08-06 ENCOUNTER — Other Ambulatory Visit: Payer: Self-pay

## 2021-08-06 ENCOUNTER — Encounter (INDEPENDENT_AMBULATORY_CARE_PROVIDER_SITE_OTHER): Payer: Self-pay | Admitting: Vascular Surgery

## 2021-08-06 ENCOUNTER — Ambulatory Visit (INDEPENDENT_AMBULATORY_CARE_PROVIDER_SITE_OTHER): Payer: BC Managed Care – PPO | Admitting: Vascular Surgery

## 2021-08-06 VITALS — BP 110/69 | HR 80 | Resp 16 | Wt 142.4 lb

## 2021-08-06 DIAGNOSIS — K55069 Acute infarction of intestine, part and extent unspecified: Secondary | ICD-10-CM

## 2021-08-06 DIAGNOSIS — E119 Type 2 diabetes mellitus without complications: Secondary | ICD-10-CM | POA: Diagnosis not present

## 2021-08-06 DIAGNOSIS — K219 Gastro-esophageal reflux disease without esophagitis: Secondary | ICD-10-CM

## 2021-08-06 DIAGNOSIS — I10 Essential (primary) hypertension: Secondary | ICD-10-CM

## 2021-08-09 ENCOUNTER — Encounter (INDEPENDENT_AMBULATORY_CARE_PROVIDER_SITE_OTHER): Payer: Self-pay | Admitting: Vascular Surgery

## 2022-01-13 ENCOUNTER — Ambulatory Visit: Payer: 59 | Attending: Neurology

## 2022-01-13 DIAGNOSIS — G4733 Obstructive sleep apnea (adult) (pediatric): Secondary | ICD-10-CM | POA: Diagnosis present

## 2022-01-13 DIAGNOSIS — Z8673 Personal history of transient ischemic attack (TIA), and cerebral infarction without residual deficits: Secondary | ICD-10-CM | POA: Insufficient documentation

## 2022-01-15 ENCOUNTER — Emergency Department: Payer: BC Managed Care – PPO

## 2022-01-15 ENCOUNTER — Other Ambulatory Visit: Payer: Self-pay

## 2022-01-15 ENCOUNTER — Emergency Department
Admission: EM | Admit: 2022-01-15 | Discharge: 2022-01-15 | Disposition: A | Discharge status: Home / Self Care | Payer: BC Managed Care – PPO | Attending: Emergency Medicine | Admitting: Emergency Medicine

## 2022-01-15 DIAGNOSIS — S8001XA Contusion of right knee, initial encounter: Secondary | ICD-10-CM | POA: Insufficient documentation

## 2022-01-15 DIAGNOSIS — S8991XA Unspecified injury of right lower leg, initial encounter: Secondary | ICD-10-CM | POA: Diagnosis present

## 2022-01-15 DIAGNOSIS — I10 Essential (primary) hypertension: Secondary | ICD-10-CM | POA: Diagnosis not present

## 2022-01-15 DIAGNOSIS — W19XXXA Unspecified fall, initial encounter: Secondary | ICD-10-CM

## 2022-01-15 DIAGNOSIS — W01198A Fall on same level from slipping, tripping and stumbling with subsequent striking against other object, initial encounter: Secondary | ICD-10-CM | POA: Diagnosis not present

## 2022-01-15 DIAGNOSIS — E119 Type 2 diabetes mellitus without complications: Secondary | ICD-10-CM | POA: Diagnosis not present

## 2022-01-15 DIAGNOSIS — S0990XA Unspecified injury of head, initial encounter: Secondary | ICD-10-CM | POA: Insufficient documentation

## 2022-01-15 DIAGNOSIS — S40022A Contusion of left upper arm, initial encounter: Secondary | ICD-10-CM | POA: Diagnosis not present

## 2022-01-15 DIAGNOSIS — Z7901 Long term (current) use of anticoagulants: Secondary | ICD-10-CM | POA: Insufficient documentation

## 2022-01-15 MED ORDER — ACETAMINOPHEN 325 MG PO TABS
650.0000 mg | ORAL_TABLET | Freq: Once | ORAL | Status: AC
Start: 2022-01-15 — End: 2022-01-15
  Administered 2022-01-15: 650 mg via ORAL
  Filled 2022-01-15: qty 2

## 2022-01-15 NOTE — ED Notes (Signed)
Patient discharged to home per MD order. Patient in stable condition, and deemed medically cleared by ED provider for discharge. Discharge instructions reviewed with patient/family using "Teach Back"; verbalized understanding of medication education and administration, and information about follow-up care. Denies further concerns. ° °

## 2022-01-15 NOTE — ED Provider Notes (Signed)
[facility] Provider Note    Event Date/Time   First MD Initiated Contact with Patient 01/15/22 1404     (approximate)   History   Fall   HPI  Nicole Villegas is a 56 y.o. female with a past medical history of diabetes, gastroenteritis, GERD,'s hypertension, hyperlipidemia, CVA, myocardial infarction, on chronic anticoagulation who presents today for evaluation after a fall.  Patient was reportedly doing a stress test on the treadmill with cardiology, she let go of the handles and fell forward, striking her head and face.  She denies headache, blurry vision, dizziness.  She reports that she has pain in her left forearm where she struck the treadmill and her right knee.  She reports that she has a mild headache only.  She has not had any vomiting or vision changes.  No neck pain.  She has not had any dizziness.  She currently feels "fine."  No abdominal pain, chest pain, shortness of breath.  She presents with her son.         Physical Exam   Triage Vital Signs: ED Triage Vitals  Enc Vitals Group     BP 01/15/22 1235 (!) 179/91     Pulse Rate 01/15/22 1235 85     Resp 01/15/22 1235 16     Temp 01/15/22 1235 98.3 F (36.8 C)     Temp Source 01/15/22 1235 Oral     SpO2 01/15/22 1235 100 %     Weight --      Height --      Head Circumference --      Peak Flow --      Pain Score 01/15/22 1241 6     Pain Loc --      Pain Edu? --      Excl. in GC? --     Most recent vital signs: Vitals:   01/15/22 1235  BP: (!) 179/91  Pulse: 85  Resp: 16  Temp: 98.3 F (36.8 C)  SpO2: 100%    Physical Exam Vitals and nursing note reviewed.  Constitutional:      General: Awake and alert. No acute distress.    Appearance: Normal appearance. The patient is normal weight.  HENT:     Head: Normocephalic and atraumatic.  No hematoma or evidence of facial injury    Mouth: Mucous membranes are moist.  Eyes:     General: PERRL. Normal EOMs        Right eye: No  discharge.        Left eye: No discharge.     Conjunctiva/sclera: Conjunctivae normal.  Cardiovascular:     Rate and Rhythm: Normal rate and regular rhythm.     Pulses: Normal pulses.     Heart sounds: Normal heart sounds Pulmonary:     Effort: Pulmonary effort is normal. No respiratory distress.  No chest wall tenderness or ecchymosis    Breath sounds: Normal breath sounds.  Abdominal:     Abdomen is soft. There is no abdominal tenderness. No rebound or guarding. No distention. Musculoskeletal:        General: No swelling. Normal range of motion.     Cervical back: Normal range of motion and neck supple.  No tenderness to palpation along bilateral arms or legs.  Full and normal range of motion of right knee, extensor mechanism intact. Skin:    General: Skin is warm and dry.     Capillary Refill: Capillary refill takes less than 2 seconds.  Findings: No rash.  Neurological:     Mental Status: The patient is awake and alert.  Neurological: GCS 15 alert and oriented x3 Normal speech, no expressive or receptive aphasia or dysarthria Cranial nerves II through XII intact Normal visual fields 5 out of 5 strength in all 4 extremities with intact sensation throughout No extremity drift Normal finger-to-nose testing, no limb or truncal ataxia     ED Results / Procedures / Treatments   Labs (all labs ordered are listed, but only abnormal results are displayed) Labs Reviewed - No data to display   EKG     RADIOLOGY I independently reviewed and interpreted imaging and agree with radiologists findings.     PROCEDURES:  Critical Care performed:   Procedures   MEDICATIONS ORDERED IN ED: Medications  acetaminophen (TYLENOL) tablet 650 mg (650 mg Oral Given 01/15/22 1431)     IMPRESSION / MDM / ASSESSMENT AND PLAN / ED COURSE  I reviewed the triage vital signs and the nursing notes.   Differential diagnosis includes, but is not limited to, fracture, dislocation,  contusion, intracranial hemorrhage, concussion, cervical spine injury.  Patient is awake and alert, hemodynamically stable and neurologically intact.  She presents with her son.  There are no obvious deformities, ecchymosis, or injuries noted.  No hematomas.  Imaging obtained at triage is overall reassuring against any acute injuries.  Patient was treated symptomatically with Tylenol.  She reports that she did not have any chest pain during the stress test to raise concern for acute myocardial infarction.  She currently does not have any chest pain or shortness of breath or nausea or diaphoresis.  Patient reports that she feels her normal self.  She feels ready to go home.  She was discharged in stable condition in the care of her son.   Patient's presentation is most consistent with acute illness / injury with system symptoms.       FINAL CLINICAL IMPRESSION(S) / ED DIAGNOSES   Final diagnoses:  Injury of head, initial encounter  Fall, initial encounter  Contusion of left upper extremity, initial encounter  Contusion of right knee, initial encounter     Rx / DC Orders   ED Discharge Orders     None        Note:  This document was prepared using Dragon voice recognition software and may include unintentional dictation errors.   Jackelyn Hoehn, PA-C 01/15/22 1439    Gilles Chiquito, MD 01/15/22 [phone]

## 2022-01-15 NOTE — ED Provider Triage Note (Signed)
Emergency Medicine Provider Triage Evaluation Note  Solimar Maiden , a 56 y.o. female  was evaluated in triage.  Pt complains of facial pain, mild headache, left wrist and elbow pain, and right knee pain after falling during stress test at [facility]. She states that the treadmill was going too fast which caused her fall. No chest pain or palpitations. No loss of consciousness.  Review of Systems  Positive:  Negative:  Physical Exam  BP (!) 179/91 (BP Location: Left Arm)   Pulse 85   Temp 98.3 F (36.8 C) (Oral)   Resp 16   SpO2 100%  Gen:   Awake, no distress   Resp:  Normal effort  MSK:   Moves extremities without difficulty  Other:    Medical Decision Making  Medically screening exam initiated at 12:41 PM.  Appropriate orders placed.  Sholanda Croson was informed that the remainder of the evaluation will be completed by another provider, this initial triage assessment does not replace that evaluation, and the importance of remaining in the ED until their evaluation is complete.    [name], FNP 01/15/22 1358

## 2022-01-15 NOTE — ED Triage Notes (Signed)
Pt to ED via [facility] after falling during stress test due to the speed. Pt states she fell forward and hit her face, left elbow and right knee. Pt denies N/V. Pt endorses mild HA. Pt denies LOC, CP or SOB. Pt on eliquis for blood clots

## 2022-01-15 NOTE — Discharge Instructions (Signed)
Your x-rays and CAT scans are normal.  Please return for any new, worsening, or change in symptoms or other concerns.  It was a pleasure caring for you today.

## 2022-02-03 ENCOUNTER — Other Ambulatory Visit (INDEPENDENT_AMBULATORY_CARE_PROVIDER_SITE_OTHER): Payer: Self-pay | Admitting: Vascular Surgery

## 2022-02-03 DIAGNOSIS — K55069 Acute infarction of intestine, part and extent unspecified: Secondary | ICD-10-CM

## 2022-02-03 NOTE — Progress Notes (Signed)
MRN : [id number]  Nicole Villegas is a 56 y.o. (10/13/1965) female who presents with chief complaint of legs hurt and swell.  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  Her symptoms have resolved.  Mesenteric vein thrombus was identified at [facility] by CT scan.  The initial symptoms were pain in the abdomen.  Of note, the patient had recently undergone appendectomy for appendicitis.  Comparison of the CT scans before surgery and after demonstrates the thrombus was new.  No evidence of abscess in the right lower quadrant.  She was admitted to the hospital and initiated on heparin drip and then transition to oral anticoagulation.   No SOB or pleuritic chest pains.  No cough or hemoptysis.   No blood per rectum or blood in any sputum.  No excessive bruising per the patient.    No outpatient medications have been marked as taking for the 02/04/22 encounter (Appointment) with Gilda Crease, Latina Craver, MD.    Past Medical History:  Diagnosis Date   Diabetes mellitus without complication (HCC)    Hyperlipidemia    Hypertension     Past Surgical History:  Procedure Laterality Date   APPENDECTOMY     CESAREAN SECTION     x3   XI ROBOTIC LAPAROSCOPIC ASSISTED APPENDECTOMY N/A 07/15/2021   Procedure: XI ROBOTIC LAPAROSCOPIC ASSISTED APPENDECTOMY;  Surgeon: Sung Amabile, DO;  Location: [facility] ORS;  Service: General;  Laterality: N/A;    Social History Social History   Tobacco Use   Smoking status: Never   Smokeless tobacco: Never  Vaping Use   Vaping Use: Never used  Substance Use Topics   Alcohol use: Not Currently   Drug use: Not Currently    Family History Family History  Problem Relation Age of Onset   Diabetes Mellitus I Maternal Aunt    Hypertension Maternal Aunt     No Known Allergies   REVIEW OF SYSTEMS (Negative unless checked)  Constitutional: [] Weight loss  [] Fever   [] Chills Cardiac: [] Chest pain   [] Chest pressure   [] Palpitations   [] Shortness of breath when laying flat   [] Shortness of breath with exertion. Vascular:  [] Pain in legs with walking   [] Pain in legs at rest  [] History of DVT   [] Phlebitis   [x] Swelling in legs   [] Varicose veins   [] Non-healing ulcers Pulmonary:   [] Uses home oxygen   [] Productive cough   [] Hemoptysis   [] Wheeze  [] COPD   [] Asthma Neurologic:  [] Dizziness   [] Seizures   [] History of stroke   [] History of TIA  [] Aphasia   [] Vissual changes   [] Weakness or numbness in arm   [] Weakness or numbness in leg Musculoskeletal:   [] Joint swelling   [] Joint pain   [] Low back pain Hematologic:  [] Easy bruising  [] Easy bleeding   [] Hypercoagulable state   [] Anemic Gastrointestinal:  [] Diarrhea   [] Vomiting  [x] Gastroesophageal reflux/heartburn   [] Difficulty swallowing. Genitourinary:  [] Chronic kidney disease   [] Difficult urination  [] Frequent urination   [] Blood in urine Skin:  [] Rashes   [] Ulcers  Psychological:  [] History of anxiety   []  History of major depression.  Physical Examination  There were no vitals filed for this visit. There is no height or weight on file to calculate BMI. Gen: WD/WN, NAD Head: [place]/AT, No temporalis wasting.  Ear/Nose/Throat: Hearing grossly intact, nares w/o erythema  or drainage, pinna without lesions Eyes: PER, EOMI, sclera nonicteric.  Neck: Supple, no gross masses.  No JVD.  Pulmonary:  Good air movement, no audible wheezing, no use of accessory muscles.  Cardiac: RRR, precordium not hyperdynamic. Vascular:   Vessel Right Left  Radial Palpable Palpable  Gastrointestinal: soft, non-distended. No guarding/no peritoneal signs.  Musculoskeletal: M/S 5/5 throughout.  No deformity.  Neurologic: CN 2-12 intact. Pain and light touch intact in extremities.  Symmetrical.  Speech is fluent. Motor exam as listed above. Psychiatric: Judgment intact, Mood & affect appropriate for pt's clinical  situation. Dermatologic: Venous rashes no ulcers noted.  No changes consistent with cellulitis. Lymph : No lichenification or skin changes of chronic lymphedema.  CBC Lab Results  Component Value Date   WBC 9.6 07/24/2021   HGB 9.7 (L) 07/24/2021   HCT 29.8 (L) 07/24/2021   MCV 84.2 07/24/2021   PLT 548 (H) 07/24/2021    BMET    Component Value Date/Time   NA 131 (L) 07/26/2021 0504   K 4.4 07/26/2021 0504   CL 99 07/26/2021 0504   CO2 24 07/26/2021 0504   GLUCOSE 168 (H) 07/26/2021 0504   BUN 12 07/26/2021 0504   CREATININE 0.75 07/26/2021 0504   CALCIUM 8.6 (L) 07/26/2021 0504   GFRNONAA >60 07/26/2021 0504   GFRAA 38 (L) 01/30/2020 2138   CrCl cannot be calculated (Patient's most recent lab result is older than the maximum 21 days allowed.).  COAG Lab Results  Component Value Date   INR 1.1 07/22/2021   INR 1.1 07/14/2021    Radiology SLEEP STUDY DOCUMENTS  Result Date: 01/28/2022 Ordered by an unspecified provider.  SLEEP STUDY DOCUMENTS  Result Date: 01/28/2022 Ordered by an unspecified provider.  SLEEP STUDY DOCUMENTS  Result Date: 01/25/2022 Ordered by an unspecified provider.  DG Knee Complete 4 Views Right  Result Date: 01/15/2022 CLINICAL DATA:  Pain after a fall. EXAM: RIGHT KNEE - COMPLETE 4+ VIEW COMPARISON:  None Available. FINDINGS: No acute fracture or dislocation. Minimal medial compartment joint space narrowing and osteophyte formation. No joint effusion. IMPRESSION: No acute osseous abnormality. Electronically Signed   By: Jeronimo GreavesKyle  Talbot M.D.   On: 01/15/2022 13:35   DG Forearm Left  Result Date: 01/15/2022 CLINICAL DATA:  Left forearm pain status post fall EXAM: LEFT FOREARM - 2 VIEW COMPARISON:  None Available. FINDINGS: There is no evidence of fracture or other focal bone lesions. Soft tissues are unremarkable. IMPRESSION: Negative. Electronically Signed   By: Elige KoHetal  Patel M.D.   On: 01/15/2022 13:35   CT Head Wo Contrast  Result Date:  01/15/2022 CLINICAL DATA:  Provided history: Headache, new or worsening, posttraumatic. Facial trauma, blunt. EXAM: CT HEAD WITHOUT CONTRAST CT MAXILLOFACIAL WITHOUT CONTRAST CT CERVICAL SPINE WITHOUT CONTRAST TECHNIQUE: Multidetector CT imaging of the head, cervical spine, and maxillofacial structures were performed using the standard protocol without intravenous contrast. Multiplanar CT image reconstructions of the cervical spine and maxillofacial structures were also generated. RADIATION DOSE REDUCTION: This exam was performed according to the departmental dose-optimization program which includes automated exposure control, adjustment of the mA and/or kV according to patient size and/or use of iterative reconstruction technique. COMPARISON:  Head CT 07/25/2021. Cervical spine radiographs 01/27/2011. FINDINGS: CT HEAD FINDINGS Brain: No age advanced or lobar predominant parenchymal atrophy. Redemonstrated chronic cortical/subcortical right MCA territory infarcts within the right frontoparietal lobes and right insula. Background mild patchy and ill-defined hypoattenuation within the cerebral white matter, nonspecific but compatible with chronic small vessel ischemic  disease. There is no acute intracranial hemorrhage. No acute demarcated cortical infarct. No extra-axial fluid collection. No evidence of an intracranial mass. No midline shift. Vascular: No hyperdense vessel.  Atherosclerotic calcifications. Skull: No fracture or aggressive osseous lesion. CT MAXILLOFACIAL FINDINGS Osseous: No acute maxillofacial fracture is identified. Orbits: No acute orbital finding. Sinuses: No significant paranasal sinus disease. Soft tissues: Perimandibular soft tissue swelling. CT CERVICAL SPINE FINDINGS Alignment: Straightening of the expected cervical lordosis. No significant spondylolisthesis. Skull base and vertebrae: The basion-dental and atlanto-dental intervals are maintained.No evidence of acute fracture to the cervical  spine. Soft tissues and spinal canal: No prevertebral fluid or swelling. No visible canal hematoma. 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 Shallow disc bulge at C5-C6. No more than mild spinal canal narrowing is appreciated. No significant bony neural foraminal narrowing. Upper chest: No consolidation within the imaged lung apices. No visible pneumothorax. IMPRESSION: CT head: 1. No evidence of acute intracranial abnormality. 2. Redemonstrated chronic cortically-based right MCA territory infarcts within the right frontoparietal lobes and right insula. 3. Background mild chronic small vessel ischemic changes within the cerebral white matter. CT maxillofacial: 1. No evidence of acute maxillofacial fracture. 2. Perimandibular soft tissue swelling. CT cervical spine: 1. No evidence of acute fracture to the cervical spine. 2. Nonspecific straightening of the expected cervical lordosis. 3. 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 Shallow disc bulge at C5-C6. No more than mild spinal canal narrowing is appreciated. Electronically Signed   By: Jackey Loge D.O.   On: 01/15/2022 13:25   CT Cervical Spine Wo Contrast  Result Date: 01/15/2022 CLINICAL DATA:  Provided history: Headache, new or worsening, posttraumatic. Facial trauma, blunt. EXAM: CT HEAD WITHOUT CONTRAST CT MAXILLOFACIAL WITHOUT CONTRAST CT CERVICAL SPINE WITHOUT CONTRAST TECHNIQUE: Multidetector CT imaging of the head, cervical spine, and maxillofacial structures were performed using the standard protocol without intravenous contrast. Multiplanar CT image reconstructions of the cervical spine and maxillofacial structures were also generated. RADIATION DOSE REDUCTION: This exam was performed according to the departmental dose-optimization program which includes automated exposure control, adjustment of the mA and/or kV according to patient size and/or use of iterative  reconstruction technique. COMPARISON:  Head CT 07/25/2021. Cervical spine radiographs 01/27/2011. FINDINGS: CT HEAD FINDINGS Brain: No age advanced or lobar predominant parenchymal atrophy. Redemonstrated chronic cortical/subcortical right MCA territory infarcts within the right frontoparietal lobes and right insula. Background mild patchy and ill-defined hypoattenuation within the cerebral white matter, nonspecific but compatible with chronic small vessel ischemic disease. There is no acute intracranial hemorrhage. No acute demarcated cortical infarct. No extra-axial fluid collection. No evidence of an intracranial mass. No midline shift. Vascular: No hyperdense vessel.  Atherosclerotic calcifications. Skull: No fracture or aggressive osseous lesion. CT MAXILLOFACIAL FINDINGS Osseous: No acute maxillofacial fracture is identified. Orbits: No acute orbital finding. Sinuses: No significant paranasal sinus disease. Soft tissues: Perimandibular soft tissue swelling. CT CERVICAL SPINE FINDINGS Alignment: Straightening of the expected cervical lordosis. No significant spondylolisthesis. Skull base and vertebrae: The basion-dental and atlanto-dental intervals are maintained.No evidence of acute fracture to the cervical spine. Soft tissues and spinal canal: No prevertebral fluid or swelling. No visible canal hematoma. 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 Shallow disc bulge at C5-C6. No more than mild spinal canal narrowing is appreciated. No significant bony neural foraminal narrowing. Upper chest: No consolidation within the imaged lung apices. No visible pneumothorax. IMPRESSION: CT head: 1. No  evidence of acute intracranial abnormality. 2. Redemonstrated chronic cortically-based right MCA territory infarcts within the right frontoparietal lobes and right insula. 3. Background mild chronic small vessel ischemic changes within the cerebral white matter. CT  maxillofacial: 1. No evidence of acute maxillofacial fracture. 2. Perimandibular soft tissue swelling. CT cervical spine: 1. No evidence of acute fracture to the cervical spine. 2. Nonspecific straightening of the expected cervical lordosis. 3. 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 Shallow disc bulge at C5-C6. No more than mild spinal canal narrowing is appreciated. Electronically Signed   By: Jackey Loge D.O.   On: 01/15/2022 13:25   CT MAXILLOFACIAL WO CONTRAST  Result Date: 01/15/2022 CLINICAL DATA:  Provided history: Headache, new or worsening, posttraumatic. Facial trauma, blunt. EXAM: CT HEAD WITHOUT CONTRAST CT MAXILLOFACIAL WITHOUT CONTRAST CT CERVICAL SPINE WITHOUT CONTRAST TECHNIQUE: Multidetector CT imaging of the head, cervical spine, and maxillofacial structures were performed using the standard protocol without intravenous contrast. Multiplanar CT image reconstructions of the cervical spine and maxillofacial structures were also generated. RADIATION DOSE REDUCTION: This exam was performed according to the departmental dose-optimization program which includes automated exposure control, adjustment of the mA and/or kV according to patient size and/or use of iterative reconstruction technique. COMPARISON:  Head CT 07/25/2021. Cervical spine radiographs 01/27/2011. FINDINGS: CT HEAD FINDINGS Brain: No age advanced or lobar predominant parenchymal atrophy. Redemonstrated chronic cortical/subcortical right MCA territory infarcts within the right frontoparietal lobes and right insula. Background mild patchy and ill-defined hypoattenuation within the cerebral white matter, nonspecific but compatible with chronic small vessel ischemic disease. There is no acute intracranial hemorrhage. No acute demarcated cortical infarct. No extra-axial fluid collection. No evidence of an intracranial mass. No midline shift. Vascular: No hyperdense vessel.  Atherosclerotic  calcifications. Skull: No fracture or aggressive osseous lesion. CT MAXILLOFACIAL FINDINGS Osseous: No acute maxillofacial fracture is identified. Orbits: No acute orbital finding. Sinuses: No significant paranasal sinus disease. Soft tissues: Perimandibular soft tissue swelling. CT CERVICAL SPINE FINDINGS Alignment: Straightening of the expected cervical lordosis. No significant spondylolisthesis. Skull base and vertebrae: The basion-dental and atlanto-dental intervals are maintained.No evidence of acute fracture to the cervical spine. Soft tissues and spinal canal: No prevertebral fluid or swelling. No visible canal hematoma. 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 Shallow disc bulge at C5-C6. No more than mild spinal canal narrowing is appreciated. No significant bony neural foraminal narrowing. Upper chest: No consolidation within the imaged lung apices. No visible pneumothorax. IMPRESSION: CT head: 1. No evidence of acute intracranial abnormality. 2. Redemonstrated chronic cortically-based right MCA territory infarcts within the right frontoparietal lobes and right insula. 3. Background mild chronic small vessel ischemic changes within the cerebral white matter. CT maxillofacial: 1. No evidence of acute maxillofacial fracture. 2. Perimandibular soft tissue swelling. CT cervical spine: 1. No evidence of acute fracture to the cervical spine. 2. Nonspecific straightening of the expected cervical lordosis. 3. 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 Shallow disc bulge at C5-C6. No more than mild spinal canal narrowing is appreciated. Electronically Signed   By: Jackey Loge D.O.   On: 01/15/2022 13:25     Assessment/Plan 1. Mesenteric vein thrombosis (HCC) Recommend:   No surgery or intervention at this point in time.  IVC filter is not indicated at present.  Patient's duplex ultrasound of the mesenteric venous system  shows the veins are patent.  The patient has completed 6 months anticoagulation  I have discussed  DVT with the patient and her family.  I believe that the DVT was secondary to her appendicitis.  The patient will stop anticoagulation for now as there have not been any problems or complications at this point.  I did discuss half dose therapy, however at this time the patient wishes to just stop anticoagulation.  - VAS [place] MESENTERIC  2. Primary hypertension Continue antihypertensive medications as already ordered, these medications have been reviewed and there are no changes at this time.   3. Gastroesophageal reflux disease without esophagitis Continue PPI as already ordered, this medication has been reviewed and there are no changes at this time.  Avoidence of caffeine and alcohol  Moderate elevation of the head of the bed    4. Diabetes mellitus without complication (HCC) Continue hypoglycemic medications as already ordered, these medications have been reviewed and there are no changes at this time.  Hgb A1C to be monitored as already arranged by primary service   5. Hyperlipidemia, unspecified hyperlipidemia type Continue statin as ordered and reviewed, no changes at this time     Levora Dredge, MD  02/03/2022 7:47 AM

## 2022-02-04 ENCOUNTER — Ambulatory Visit (INDEPENDENT_AMBULATORY_CARE_PROVIDER_SITE_OTHER): Payer: 59 | Admitting: Vascular Surgery

## 2022-02-04 ENCOUNTER — Ambulatory Visit (INDEPENDENT_AMBULATORY_CARE_PROVIDER_SITE_OTHER): Payer: 59

## 2022-02-04 DIAGNOSIS — I10 Essential (primary) hypertension: Secondary | ICD-10-CM

## 2022-02-04 DIAGNOSIS — E785 Hyperlipidemia, unspecified: Secondary | ICD-10-CM

## 2022-02-04 DIAGNOSIS — K55069 Acute infarction of intestine, part and extent unspecified: Secondary | ICD-10-CM

## 2022-02-04 DIAGNOSIS — K219 Gastro-esophageal reflux disease without esophagitis: Secondary | ICD-10-CM

## 2022-02-04 DIAGNOSIS — E119 Type 2 diabetes mellitus without complications: Secondary | ICD-10-CM

## 2022-02-07 ENCOUNTER — Encounter (INDEPENDENT_AMBULATORY_CARE_PROVIDER_SITE_OTHER): Payer: Self-pay | Admitting: Vascular Surgery

## 2022-02-11 ENCOUNTER — Encounter (INDEPENDENT_AMBULATORY_CARE_PROVIDER_SITE_OTHER): Payer: Self-pay | Admitting: *Deleted

## 2022-05-17 ENCOUNTER — Encounter (INDEPENDENT_AMBULATORY_CARE_PROVIDER_SITE_OTHER): Payer: Self-pay

## 2023-05-30 ENCOUNTER — Other Ambulatory Visit: Payer: Self-pay

## 2023-05-30 ENCOUNTER — Emergency Department
Admission: EM | Admit: 2023-05-30 | Discharge: 2023-05-30 | Disposition: A | Discharge status: Home / Self Care | Payer: 59 | Attending: Emergency Medicine | Admitting: Emergency Medicine

## 2023-05-30 ENCOUNTER — Emergency Department: Payer: 59

## 2023-05-30 DIAGNOSIS — N179 Acute kidney failure, unspecified: Secondary | ICD-10-CM | POA: Insufficient documentation

## 2023-05-30 DIAGNOSIS — E119 Type 2 diabetes mellitus without complications: Secondary | ICD-10-CM | POA: Diagnosis not present

## 2023-05-30 DIAGNOSIS — E876 Hypokalemia: Secondary | ICD-10-CM | POA: Diagnosis not present

## 2023-05-30 DIAGNOSIS — Z20822 Contact with and (suspected) exposure to covid-19: Secondary | ICD-10-CM | POA: Insufficient documentation

## 2023-05-30 DIAGNOSIS — R4182 Altered mental status, unspecified: Secondary | ICD-10-CM | POA: Diagnosis present

## 2023-05-30 DIAGNOSIS — I10 Essential (primary) hypertension: Secondary | ICD-10-CM | POA: Diagnosis not present

## 2023-05-30 DIAGNOSIS — I2510 Atherosclerotic heart disease of native coronary artery without angina pectoris: Secondary | ICD-10-CM | POA: Diagnosis not present

## 2023-05-30 DIAGNOSIS — N3000 Acute cystitis without hematuria: Secondary | ICD-10-CM

## 2023-05-30 LAB — URINALYSIS, ROUTINE W REFLEX MICROSCOPIC
Bilirubin Urine: NEGATIVE
Glucose, UA: 150 mg/dL — AB
Ketones, ur: NEGATIVE mg/dL
Nitrite: NEGATIVE
Protein, ur: 100 mg/dL — AB
Specific Gravity, Urine: 1.016 (ref 1.005–1.030)
WBC, UA: >50 WBC/hpf (ref 0–5)
pH: 5 (ref 5.0–8.0)

## 2023-05-30 LAB — CBC
HCT: 47.9 % — ABNORMAL HIGH (ref 36.0–46.0)
Hemoglobin: 16.1 g/dL — ABNORMAL HIGH (ref 12.0–15.0)
MCH: 29.1 pg (ref 26.0–34.0)
MCHC: 33.6 g/dL (ref 30.0–36.0)
MCV: 86.5 fL (ref 80.0–100.0)
Platelets: 287 10*3/uL (ref 150–400)
RBC: 5.54 MIL/uL — ABNORMAL HIGH (ref 3.87–5.11)
RDW: 12.5 % (ref 11.5–15.5)
WBC: 11.5 10*3/uL — ABNORMAL HIGH (ref 4.0–10.5)
nRBC: 0 % (ref 0.0–0.2)

## 2023-05-30 LAB — CBG MONITORING, ED: Glucose-Capillary: 122 mg/dL — ABNORMAL HIGH (ref 70–99)

## 2023-05-30 LAB — COMPREHENSIVE METABOLIC PANEL
ALT: 23 U/L (ref 0–44)
AST: 34 U/L (ref 15–41)
Albumin: 4 g/dL (ref 3.5–5.0)
Alkaline Phosphatase: 106 U/L (ref 38–126)
Anion gap: 14 (ref 5–15)
BUN: 22 mg/dL — ABNORMAL HIGH (ref 6–20)
CO2: 21 mmol/L — ABNORMAL LOW (ref 22–32)
Calcium: 10.3 mg/dL (ref 8.9–10.3)
Chloride: 102 mmol/L (ref 98–111)
Creatinine, Ser: 1.14 mg/dL — ABNORMAL HIGH (ref 0.44–1.00)
GFR, Estimated: 56 mL/min — ABNORMAL LOW (ref >60)
Glucose, Bld: 117 mg/dL — ABNORMAL HIGH (ref 70–99)
Potassium: 3.1 mmol/L — ABNORMAL LOW (ref 3.5–5.1)
Sodium: 137 mmol/L (ref 135–145)
Total Bilirubin: 0.7 mg/dL (ref <1.2)
Total Protein: 8 g/dL (ref 6.5–8.1)

## 2023-05-30 LAB — RESP PANEL BY RT-PCR (RSV, FLU A&B, COVID)  RVPGX2
Influenza A by PCR: NEGATIVE
Influenza B by PCR: NEGATIVE
Resp Syncytial Virus by PCR: NEGATIVE
SARS Coronavirus 2 by RT PCR: NEGATIVE

## 2023-05-30 LAB — TROPONIN I (HIGH SENSITIVITY): Troponin I (High Sensitivity): 10 ng/L (ref <18)

## 2023-05-30 MED ORDER — CEPHALEXIN 500 MG PO CAPS
500.0000 mg | ORAL_CAPSULE | Freq: Once | ORAL | Status: AC
  Administered 2023-05-30: 500 mg via ORAL
  Filled 2023-05-30: qty 1

## 2023-05-30 MED ORDER — LACTATED RINGERS IV BOLUS
1000.0000 mL | Freq: Once | INTRAVENOUS | Status: AC
  Administered 2023-05-30: 1000 mL via INTRAVENOUS

## 2023-05-30 MED ORDER — CEPHALEXIN 500 MG PO CAPS: 500.0000 mg | ORAL_CAPSULE | Freq: Two times a day (BID) | ORAL | 0 refills | Status: AC

## 2023-05-30 MED ORDER — POTASSIUM CHLORIDE CRYS ER 20 MEQ PO TBCR
40.0000 meq | EXTENDED_RELEASE_TABLET | Freq: Once | ORAL | Status: AC
  Administered 2023-05-30: 40 meq via ORAL
  Filled 2023-05-30: qty 2

## 2023-05-30 NOTE — ED Provider Notes (Signed)
[facility] Provider Note    Event Date/Time   First MD Initiated Contact with Patient 05/30/23 [phone]     (approximate)   History   Altered Mental Status   HPI  Phala Veigel is a 57 y.o. female who presents to the ED for evaluation of Altered Mental Status   Review of neurology clinic visit from greater than 1 year ago.  History of GERD, DM, HTN and CAD.  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   Patient presents to the ED for evaluation of a episode of tremulous bilateral feet and a few days of malaise. Patient presents with her husband and son, son provides translation services at patient's request after I offer our typical translation services.  They report that everyone has been sick in the house this week, malaise, congestion and feeling unwell.  Tonight around 3 AM, patient had a brief episode of tremulousness to her bilateral feet without seizure activity, syncope or other disruption of consciousness.  No falls or injuries.  Reports chills and cold sweats this week.  She is concerned that she may have had a stroke as she had similar tremulousness to her feet with a stroke years ago in the past.  Here in the ED, family reports that she looks normal and well   Physical Exam   Triage Vital Signs: ED Triage Vitals  Encounter Vitals Group     BP 05/30/23 0455 (!) 119/58     Systolic BP Percentile --      Diastolic BP Percentile --      Pulse Rate 05/30/23 0455 (!) 105     Resp 05/30/23 0455 14     Temp 05/30/23 0455 98 F (36.7 C)     Temp Source 05/30/23 0455 Oral     SpO2 05/30/23 0455 98 %     Weight --      Height 05/30/23 0449 5\' 1"  (1.549 m)     Head Circumference --      Peak Flow --      Pain Score 05/30/23 0449 0     Pain Loc --      Pain Education --      Exclude from Growth Chart --     Most recent vital signs: Vitals:   05/30/23 0615 05/30/23 0630  BP: (!) 119/47 (!) 112/52  Pulse: 99 93  Resp: 20 20   Temp:    SpO2: 98% 97%    General: Awake, no distress.  CV:  Good peripheral perfusion.  Resp:  Normal effort.  Abd:  No distention.  Soft and benign MSK:  No deformity noted.  No tremulousness, rhythmic activity or seizure activity noted.  No clonus to bilateral ankles Neuro:  No focal deficits appreciated. Cranial nerves II through XII intact 5/5 strength and sensation in all 4 extremities Other:     ED Results / Procedures / Treatments   Labs (all labs ordered are listed, but only abnormal results are displayed) Labs Reviewed  COMPREHENSIVE METABOLIC PANEL - Abnormal; Notable for the following components:      Result Value   Potassium 3.1 (*)    CO2 21 (*)    Glucose, Bld 117 (*)    BUN 22 (*)    Creatinine, Ser 1.14 (*)    GFR, Estimated 56 (*)    All other components within normal limits  CBC - Abnormal; Notable for the following components:   WBC 11.5 (*)  RBC 5.54 (*)    Hemoglobin 16.1 (*)    HCT 47.9 (*)    All other components within normal limits  CBG MONITORING, ED - Abnormal; Notable for the following components:   Glucose-Capillary 122 (*)    All other components within normal limits  RESP PANEL BY RT-PCR (RSV, FLU A&B, COVID)  [id number]  URINALYSIS, ROUTINE W REFLEX MICROSCOPIC  TROPONIN I (HIGH SENSITIVITY)  TROPONIN I (HIGH SENSITIVITY)    EKG Sinus tachycardia with rate of 105 bpm.  Normal axis and intervals.  No clear signs of acute ischemia.  RADIOLOGY CXR interpreted by me without evidence of acute cardiopulmonary pathology. CT head interpreted by me without evidence of acute intracranial pathology  Official radiology report(s): DG Chest 2 View  Result Date: 05/30/2023 CLINICAL DATA:  57 year old female with sick contacts, altered mental status. EXAM: CHEST - 2 VIEW COMPARISON:  Chest radiographs 07/14/2021 and earlier. FINDINGS: Seated AP and lateral views the chest 0551 hours. Moderate elevation of the right hemidiaphragm appears to be  chronic. Stable lung volumes overall. Mediastinal contours remain normal. Visualized tracheal air column is within normal limits. Both lungs appear stable and clear. No acute osseous abnormality identified. Negative visible bowel gas. IMPRESSION: No acute cardiopulmonary abnormality. Chronic elevation of the right hemidiaphragm which could be normal variant or a degree of chronic phrenic nerve palsy. Electronically Signed   By: Odessa Fleming M.D.   On: 05/30/2023 06:20   CT HEAD WO CONTRAST ( )  Result Date: 05/30/2023 CLINICAL DATA:  57 year old female with altered mental status. EXAM: CT HEAD WITHOUT CONTRAST TECHNIQUE: Contiguous axial images were obtained from the base of the skull through the vertex without intravenous contrast. RADIATION DOSE REDUCTION: This exam was performed according to the departmental dose-optimization program which includes automated exposure control, adjustment of the mA and/or kV according to patient size and/or use of iterative reconstruction technique. COMPARISON:  Brain MRI 07/24/2021.  Head CT 01/15/2022. 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 Mild ex vacuo enlargement of the right lateral ventricle is stable. No midline shift, ventriculomegaly, mass effect, evidence of mass lesion, intracranial hemorrhage or evidence of cortically based acute infarction. Gray-white matter differentiation is within normal limits throughout the brain. *CRASH * that maintained gray-white differentiation elsewhere with mild additional chronic white matter changes. Vascular: Calcified atherosclerosis at the skull base. No suspicious intracranial vascular hyperdensity. Stable faint basal ganglia vascular calcifications. Skull: Stable, intact. Sinuses/Orbits: Visualized paranasal sinuses and mastoids are stable and well aerated. Other: Visualized orbits and scalp soft tissues are within normal limits. IMPRESSION: 1. No acute intracranial abnormality. 2. Chronic right  MCA territory infarct and other patchy chronic white matter disease. Electronically Signed   By: Odessa Fleming M.D.   On: 05/30/2023 05:51    PROCEDURES and INTERVENTIONS:  .1-3 Lead EKG Interpretation  Performed by: Delton Prairie, MD Authorized by: Delton Prairie, MD     Interpretation: normal     ECG rate:  90   ECG rate assessment: normal     Rhythm: sinus rhythm     Ectopy: none     Conduction: normal     Medications  potassium chloride SA (KLOR-CON M) CR tablet 40 mEq (40 mEq Oral Given 05/30/23 0606)  lactated ringers bolus 1,000 mL (1,000 mLs Intravenous New Bag/Given 05/30/23 0606)     IMPRESSION / MDM / ASSESSMENT AND PLAN / ED COURSE  I reviewed the triage vital signs and the nursing notes.  Differential diagnosis includes, but is not  limited to, viral syndrome such as COVID, pneumonia, DKA or dehydration, ICH, stroke or TIA.  Seizure.  {Patient presents with symptoms of an acute illness or injury that is potentially life-threatening.  Patient presents to ED with vague malaise and bilateral feet trembling of possible viral etiology.  Some signs of mild hypokalemia and mild AKI on her metabolic panel.  Hemoconcentration on CBC.  Negative viral swabs, troponin and clear imaging.  Awaiting a urinalysis.  She is feeling better and I suspect to be suitable for outpatient management pending his UA and ambulatory trial.  Clinical Course as of 05/30/23 0658  Mon May 30, 2023  3474 Reassessed, still looks well and family says that she is normal.  Fluids ongoing and she reports feeling a bit better.  Discussed need for urinalysis.  They are comfortable with her going home [DS]    Clinical Course User Index [DS] Delton Prairie, MD     FINAL CLINICAL IMPRESSION(S) / ED DIAGNOSES   Final diagnoses:  Hypokalemia  AKI (acute kidney injury) (HCC)     Rx / DC Orders   ED Discharge Orders     None        Note:  This document was prepared using Dragon voice recognition software  and may include unintentional dictation errors.   Delton Prairie, MD 05/30/23 [phone]

## 2023-05-30 NOTE — ED Triage Notes (Addendum)
Pt to ed from home via [facility] for AMS. Family called due to AMS. Language barrier.  20 L wrist 164/100 BGL 183  EMS stroke screen NEG for EMS. Pt was ambulatory on scene.   Pt is alert and oriented in triage with interpreter. Pt denies and N/V. Pt felt like her sugar was low and felt like she was having a stroke. Pt BGL normal for EMS and for ED staff. Pt states "I had cold sweats is why I felt I was having a stroke". Pt then states she has had an intermittent fever with sweats and chills.

## 2023-05-30 NOTE — ED Notes (Signed)
Rainbow and lactic sent down to lab.

## 2023-05-30 NOTE — ED Provider Notes (Signed)
Patient received in signout from Dr. Katrinka Blazing pending follow-up blood work imaging and urinalysis.  UA is consistent with UTI.  Remainder of workup is reassuring she is well-appearing tolerating p.o.  Does appear improved after IV fluids.  Suspect symptoms secondary to acute cystitis.  Does appear appropriate for outpatient follow-up and treatment.   Willy Eddy, MD 05/30/23 [phone]

## 2023-05-30 NOTE — ED Notes (Signed)
Delay in triage due to pt defecated all over herself and had to clean up in the bathroom.

## 2023-05-30 NOTE — ED Notes (Signed)
Pt tried to provide a urine sample but was unable to void.

## 2023-05-30 NOTE — ED Notes (Signed)
Pt unable to urinate after 1L LR bolus. Pt bladder scanned showing .

## 2023-10-03 ENCOUNTER — Encounter: Payer: Self-pay | Admitting: Urology

## 2023-10-03 ENCOUNTER — Ambulatory Visit (INDEPENDENT_AMBULATORY_CARE_PROVIDER_SITE_OTHER): Payer: 59 | Admitting: Urology

## 2023-10-03 VITALS — BP 120/72 | HR 96 | Ht 61.0 in | Wt 142.0 lb

## 2023-10-03 DIAGNOSIS — N390 Urinary tract infection, site not specified: Secondary | ICD-10-CM

## 2023-10-03 LAB — BLADDER SCAN AMB NON-IMAGING: Scan Result: 0

## 2023-10-03 NOTE — Progress Notes (Signed)
 I, Maysun Anabel Bene, acting as a scribe for Riki Altes, MD., have documented all relevant documentation on the behalf of Riki Altes, MD, as directed by Riki Altes, MD while in the presence of Riki Altes, MD.  10/03/2023 4:37 PM   Nicole Villegas 1966-03-27 [id number]  Referring provider: Leroy Kennedy, PA-C [address] [place],  [place] [address]  Chief Complaint  Patient presents with   Establish Care   Recurrent UTI    HPI: Nicole Villegas is a 58 y.o. female referred for evaluation of recurrent UTIs. A [place] interpreter was present during this visit.   Review of forwarded records only mentions recurrent UTIs, although only 1 urine culture was fortified, which was positive for Klebsiella. On Epic Review, she was seen in the ED November 2024 and UA showed >50 WBCs, however there were 6-10 squamous epithelial cells, and a urine culture was not ordered. She had a positive culture December 2022 for E. coli and October 2019 for E. coli. Say she has had urinary tract infections for 5 years. When asked how many times she estimates she has been treated for infections over the last year, she responded "all the time" She is sexually active but denies any relation of symptom onset to coitus.  States her typical symptoms are dysuria, frequency, and urgency. Her symptoms will resolve with antibiotic therapy, then return.  No history of febrile UTIs. CT abdomen pelvis January 2023 showed no urinary tract calculi or anatomic abnormalities.   PMH: Past Medical History:  Diagnosis Date   Diabetes mellitus without complication (HCC)    Hyperlipidemia    Hypertension     Surgical History: Past Surgical History:  Procedure Laterality Date   APPENDECTOMY     CESAREAN SECTION     x3   XI ROBOTIC LAPAROSCOPIC ASSISTED APPENDECTOMY N/A 07/15/2021   Procedure: XI ROBOTIC LAPAROSCOPIC ASSISTED APPENDECTOMY;  Surgeon: Sung Amabile, DO;  Location: [facility] ORS;   Service: General;  Laterality: N/A;    Home Medications:  Allergies as of 10/03/2023   No Known Allergies      Medication List        Accurate as of October 03, 2023  4:37 PM. If you have any questions, ask your nurse or doctor.          STOP taking these medications    pantoprazole 40 MG tablet Commonly known as: Protonix Stopped by: Riki Altes       TAKE these medications    apixaban 5 MG Tabs tablet Commonly known as: ELIQUIS 2 tabs twice a day for 5 days then 1 tab twice a day   atenolol 50 MG tablet Commonly known as: TENORMIN Take 50 mg by mouth daily.   atorvastatin 80 MG tablet Commonly known as: LIPITOR Take 1 tablet (80 mg total) by mouth daily.   docusate sodium 100 MG capsule Commonly known as: COLACE Take 100 mg by mouth daily as needed for mild constipation.   fexofenadine 60 MG tablet Commonly known as: ALLEGRA Take 60 mg by mouth 2 (two) times daily as needed for allergies.   fluticasone 50 MCG/ACT nasal spray Commonly known as: FLONASE Place 2 sprays into both nostrils daily as needed for allergies.   gabapentin 300 MG capsule Commonly known as: NEURONTIN Take 300 mg by mouth 2 (two) times daily.   glucose blood test strip Use as instructed   insulin detemir 100 UNIT/ML injection Commonly known as: LEVEMIR Inject  0.3 mLs (30 Units total) into the skin at bedtime.   insulin lispro 100 UNIT/ML KwikPen Commonly known as: HUMALOG With meals   nortriptyline 10 MG capsule Commonly known as: PAMELOR Take 2 capsules by mouth at bedtime.   NovoLOG FlexPen 100 UNIT/ML FlexPen Generic drug: insulin aspart Inject into the skin.   Ozempic (0.25 or 0.5 MG/DOSE) 2 MG/1.5ML Sopn Generic drug: Semaglutide(0.25 or 0.5MG /DOS) Inject into the skin.   sitaGLIPtin-metformin 50-1000 MG tablet Commonly known as: JANUMET Take 1 tablet by mouth 2 (two) times daily with a meal.        Allergies: No Known Allergies  Family  History: Family History  Problem Relation Age of Onset   Diabetes Mellitus I Maternal Aunt    Hypertension Maternal Aunt     Social History:  reports that she has never smoked. She has never used smokeless tobacco. She reports that she does not currently use alcohol. She reports that she does not currently use drugs.   Physical Exam: BP 120/72   Pulse 96   Ht 5\' 1"  (1.549 m)   Wt 142 lb (64.4 kg)   BMI 26.83 kg/m   Constitutional:  Alert and oriented, No acute distress. HEENT: [place] AT, moist mucus membranes.  Trachea midline, no masses. Cardiovascular: No clubbing, cyanosis, or edema. Respiratory: Normal respiratory effort, no increased work of breathing. GI: Abdomen is soft, nontender, nondistended, no abdominal masses Skin: No rashes, bruises or suspicious lesions. Neurologic: Grossly intact, no focal deficits, moving all 4 extremities. Psychiatric: Normal mood and affect.  Urinalysis Dipstick trace glucose, trace ketone, trace blood, 1+ leukocyte microscopy 6-10 WBC.   Pertinent Imaging: CT was personally reviewed and interpreted.  CT EXAM: CT ABDOMEN AND PELVIS WITH CONTRAST   TECHNIQUE: Multidetector CT imaging of the abdomen and pelvis was performed using the standard protocol following bolus administration of intravenous contrast.   CONTRAST:  OMNIPAQUE IOHEXOL 300 MG/ML  SOLN   COMPARISON:  CT 07/14/2021   FINDINGS: Lower chest: No acute airspace disease or pleural effusion. Mild wall thickening of the distal esophagus.   Hepatobiliary: No focal liver abnormality is seen. Mild heterogeneous hepatic perfusion. The gallbladder is partially distended. No calcified gallstone or pericholecystic inflammation.   Pancreas: No ductal dilatation or inflammation.   Spleen: Normal in size without focal abnormality.   Adrenals/Urinary Tract: Normal adrenal glands. No hydronephrosis or perinephric edema. Homogeneous renal enhancement with symmetric excretion  on delayed phase imaging. No visualized renal stone or focal lesion. Slight lobulated bilateral renal contours. Urinary bladder is physiologically distended without wall thickening.   Stomach/Bowel: Interval appendectomy. No abscess. Ingested material distends the stomach. Minimal distal esophageal wall thickening. No small bowel obstruction. Occasional fluid-filled loops of small bowel in the pelvis. Slight small bowel hyperemia but no definite wall thickening. No abnormal distension, ileus, or obstruction. Fluid/liquid stool in the ascending, transverse, and descending colon. No colonic inflammation or wall thickening.   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 Thrombus spans approximately 9.6 cm and extends from the mid abdomen to the portal splenic confluence. Mild adjacent peri venous stranding. There is no thrombus extension into the portal or splenic veins. The peripheral venous branches are patent. Multiple prominent central mesenteric lymph nodes with mild mesenteric edema. Aortic atherosclerosis.   Reproductive: The uterus is deviated into the left pelvis, unchanged. Calcification in the right ovary. No suspicious adnexal mass.   Other: Mesenteric edema with trace mesenteric free fluid. Trace perihepatic free fluid, non organized. No abscess  or free air. There is no subcutaneous collection, expected postsurgical changes the abdominal wall.   Musculoskeletal: Stable osseous structures.   IMPRESSION: 1. 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 Mild adjacent peri venous stranding. There is no thrombus extension into the portal or splenic veins. 2. Interval appendectomy. No abscess. 3. Mild small bowel hyperemia. No obstruction or ileus. Liquid stool/fluid in the colon. 4. Mild distal esophageal wall thickening, can be seen with reflux or esophagitis. 5. Mild  heterogeneous hepatic perfusion, nonspecific.   Aortic Atherosclerosis (ICD10-I70.0).   These results were called by telephone at the time of interpretation on 07/22/2021 at 11:07 pm to provider [facility] , who verbally acknowledged these results.     Electronically Signed   By: Narda Rutherford M.D.   On: 07/22/2021 23:08   Assessment & Plan:    1. Recurrent UTIs Does not meet the AUA criteria for recurrent UTIs in women due to lack of culture documentation. Discussed that UTIs are common in women due to short urethral length and location in the vaginal area. Explained that some women are more prone to infections due to host factors or more virulent bacteria. Prevention is challenging; discussed potential side effects of recurrent infections. PVR today was 0 mL. Plan to schedule a renal ultrasound to reassess for any upper tract abnormalities. Discussed the use of cranberry supplements, which may prevent bacteria from adhering to the bladder lining.  [facility] [address] [place], [place] [address] [phone]

## 2023-10-04 LAB — MICROSCOPIC EXAMINATION

## 2023-10-04 LAB — URINALYSIS, COMPLETE
Bilirubin, UA: NEGATIVE
Nitrite, UA: NEGATIVE
Specific Gravity, UA: 1.025 (ref 1.005–1.030)
Urobilinogen, Ur: 0.2 mg/dL (ref 0.2–1.0)
pH, UA: 6.5 (ref 5.0–7.5)

## 2023-10-06 LAB — CULTURE, URINE COMPREHENSIVE

## 2023-10-07 ENCOUNTER — Other Ambulatory Visit: Payer: Self-pay

## 2023-10-07 MED ORDER — SULFAMETHOXAZOLE-TRIMETHOPRIM 800-160 MG PO TABS
1.0000 | ORAL_TABLET | Freq: Two times a day (BID) | ORAL | 0 refills | Status: AC
Start: 2023-10-07 — End: ?
